# Patient Record
Sex: Male | Born: 1955 | Race: White | Hispanic: No | Marital: Married | Smoking: Never smoker
Health system: Southern US, Community
[De-identification: ages and names within clinical notes are randomized; demographics above are authoritative.]

## PROBLEM LIST (undated history)

## (undated) DIAGNOSIS — N403 Nodular prostate with lower urinary tract symptoms: Secondary | ICD-10-CM

## (undated) DIAGNOSIS — M1990 Unspecified osteoarthritis, unspecified site: Secondary | ICD-10-CM

## (undated) DIAGNOSIS — R7303 Prediabetes: Secondary | ICD-10-CM

## (undated) DIAGNOSIS — M4800 Spinal stenosis, site unspecified: Secondary | ICD-10-CM

## (undated) DIAGNOSIS — J189 Pneumonia, unspecified organism: Secondary | ICD-10-CM

## (undated) DIAGNOSIS — J3081 Allergic rhinitis due to animal (cat) (dog) hair and dander: Secondary | ICD-10-CM

## (undated) DIAGNOSIS — I10 Essential (primary) hypertension: Secondary | ICD-10-CM

## (undated) DIAGNOSIS — Z91018 Allergy to other foods: Secondary | ICD-10-CM

## (undated) DIAGNOSIS — R002 Palpitations: Secondary | ICD-10-CM

## (undated) DIAGNOSIS — G4700 Insomnia, unspecified: Secondary | ICD-10-CM

## (undated) DIAGNOSIS — J302 Other seasonal allergic rhinitis: Secondary | ICD-10-CM

## (undated) DIAGNOSIS — R5383 Other fatigue: Secondary | ICD-10-CM

## (undated) DIAGNOSIS — E7800 Pure hypercholesterolemia, unspecified: Secondary | ICD-10-CM

## (undated) DIAGNOSIS — I491 Atrial premature depolarization: Secondary | ICD-10-CM

## (undated) DIAGNOSIS — M549 Dorsalgia, unspecified: Secondary | ICD-10-CM

## (undated) DIAGNOSIS — I493 Ventricular premature depolarization: Secondary | ICD-10-CM

## (undated) DIAGNOSIS — F411 Generalized anxiety disorder: Secondary | ICD-10-CM

## (undated) DIAGNOSIS — K219 Gastro-esophageal reflux disease without esophagitis: Secondary | ICD-10-CM

## (undated) DIAGNOSIS — I519 Heart disease, unspecified: Secondary | ICD-10-CM

## (undated) DIAGNOSIS — I447 Left bundle-branch block, unspecified: Secondary | ICD-10-CM

## (undated) DIAGNOSIS — H9190 Unspecified hearing loss, unspecified ear: Secondary | ICD-10-CM

## (undated) DIAGNOSIS — E782 Mixed hyperlipidemia: Secondary | ICD-10-CM

## (undated) DIAGNOSIS — I340 Nonrheumatic mitral (valve) insufficiency: Secondary | ICD-10-CM

## (undated) DIAGNOSIS — I2510 Atherosclerotic heart disease of native coronary artery without angina pectoris: Secondary | ICD-10-CM

## (undated) DIAGNOSIS — R0602 Shortness of breath: Secondary | ICD-10-CM

## (undated) DIAGNOSIS — K5730 Diverticulosis of large intestine without perforation or abscess without bleeding: Secondary | ICD-10-CM

## (undated) DIAGNOSIS — Z974 Presence of external hearing-aid: Secondary | ICD-10-CM

## (undated) DIAGNOSIS — I209 Angina pectoris, unspecified: Secondary | ICD-10-CM

## (undated) DIAGNOSIS — F419 Anxiety disorder, unspecified: Secondary | ICD-10-CM

## (undated) DIAGNOSIS — I38 Endocarditis, valve unspecified: Secondary | ICD-10-CM

## (undated) DIAGNOSIS — M2550 Pain in unspecified joint: Secondary | ICD-10-CM

## (undated) HISTORY — DX: Heart disease, unspecified: I51.9

## (undated) HISTORY — DX: Palpitations: R00.2

## (undated) HISTORY — DX: Endocarditis, valve unspecified: I38

## (undated) HISTORY — DX: Anxiety disorder, unspecified: F41.9

## (undated) HISTORY — PX: CORONARY ANGIOPLASTY WITH STENT PLACEMENT: SHX49

## (undated) HISTORY — DX: Spinal stenosis, site unspecified: M48.00

## (undated) HISTORY — DX: Atrial premature depolarization: I49.1

## (undated) HISTORY — DX: Other fatigue: R53.83

## (undated) HISTORY — DX: Essential (primary) hypertension: I10

## (undated) HISTORY — DX: Allergy to other foods: Z91.018

## (undated) HISTORY — DX: Ventricular premature depolarization: I49.3

## (undated) HISTORY — DX: Unspecified hearing loss, unspecified ear: H91.90

## (undated) HISTORY — DX: Dorsalgia, unspecified: M54.9

## (undated) HISTORY — PX: PROSTATE BIOPSY: SHX241

## (undated) HISTORY — DX: Prediabetes: R73.03

## (undated) HISTORY — DX: Shortness of breath: R06.02

## (undated) HISTORY — DX: Pain in unspecified joint: M25.50

## (undated) HISTORY — DX: Unspecified osteoarthritis, unspecified site: M19.90

## (undated) HISTORY — DX: Nonrheumatic mitral (valve) insufficiency: I34.0

## (undated) HISTORY — PX: TONSILLECTOMY: SUR1361

## (undated) HISTORY — PX: CARDIAC CATHETERIZATION: SHX172

---

## 2002-05-10 ENCOUNTER — Encounter: Payer: Self-pay | Admitting: Family Medicine

## 2002-05-10 ENCOUNTER — Encounter: Admission: RE | Admit: 2002-05-10 | Discharge: 2002-05-10 | Payer: Self-pay | Admitting: Family Medicine

## 2008-09-16 DIAGNOSIS — I447 Left bundle-branch block, unspecified: Secondary | ICD-10-CM

## 2008-09-16 HISTORY — DX: Left bundle-branch block, unspecified: I44.7

## 2009-08-12 ENCOUNTER — Emergency Department (HOSPITAL_COMMUNITY): Admission: EM | Admit: 2009-08-12 | Discharge: 2009-08-12 | Payer: Self-pay | Admitting: Emergency Medicine

## 2009-09-16 HISTORY — PX: CARDIOVASCULAR STRESS TEST: SHX262

## 2010-12-19 LAB — DIFFERENTIAL
Basophils Absolute: 0 10*3/uL (ref 0.0–0.1)
Basophils Relative: 0 % (ref 0–1)
Eosinophils Absolute: 0 10*3/uL (ref 0.0–0.7)
Eosinophils Relative: 0 % (ref 0–5)
Lymphocytes Relative: 8 % — ABNORMAL LOW (ref 12–46)
Lymphs Abs: 1 10*3/uL (ref 0.7–4.0)
Monocytes Absolute: 0.5 10*3/uL (ref 0.1–1.0)
Monocytes Relative: 4 % (ref 3–12)
Neutro Abs: 10.9 10*3/uL — ABNORMAL HIGH (ref 1.7–7.7)
Neutrophils Relative %: 87 % — ABNORMAL HIGH (ref 43–77)

## 2010-12-19 LAB — GLUCOSE, CAPILLARY

## 2010-12-19 LAB — CBC
HCT: 44.4 % (ref 39.0–52.0)
Hemoglobin: 15.4 g/dL (ref 13.0–17.0)
MCHC: 34.6 g/dL (ref 30.0–36.0)
MCV: 93.4 fL (ref 78.0–100.0)
Platelets: 231 10*3/uL (ref 150–400)
RBC: 4.76 MIL/uL (ref 4.22–5.81)
RDW: 12.4 % (ref 11.5–15.5)
WBC: 12.5 10*3/uL — ABNORMAL HIGH (ref 4.0–10.5)

## 2010-12-19 LAB — PROTIME-INR
INR: 1 (ref 0.00–1.49)
Prothrombin Time: 13.1 seconds (ref 11.6–15.2)

## 2010-12-19 LAB — POCT I-STAT, CHEM 8
BUN: 11 mg/dL (ref 6–23)
Calcium, Ion: 1.09 mmol/L — ABNORMAL LOW (ref 1.12–1.32)
Chloride: 101 mEq/L (ref 96–112)
Creatinine, Ser: 0.8 mg/dL (ref 0.4–1.5)
Glucose, Bld: 118 mg/dL — ABNORMAL HIGH (ref 70–99)
HCT: 47 % (ref 39.0–52.0)

## 2010-12-19 LAB — POCT CARDIAC MARKERS: Troponin i, poc: <0.05 ng/mL (ref 0.00–0.09)

## 2012-09-25 ENCOUNTER — Ambulatory Visit (INDEPENDENT_AMBULATORY_CARE_PROVIDER_SITE_OTHER): Payer: Managed Care, Other (non HMO) | Admitting: Family Medicine

## 2012-09-25 VITALS — BP 165/85 | HR 74 | Temp 98.0°F | Resp 18 | Ht 66.5 in | Wt 185.0 lb

## 2012-09-25 DIAGNOSIS — I152 Hypertension secondary to endocrine disorders: Secondary | ICD-10-CM | POA: Insufficient documentation

## 2012-09-25 DIAGNOSIS — E1159 Type 2 diabetes mellitus with other circulatory complications: Secondary | ICD-10-CM | POA: Insufficient documentation

## 2012-09-25 DIAGNOSIS — R05 Cough: Secondary | ICD-10-CM

## 2012-09-25 DIAGNOSIS — R509 Fever, unspecified: Secondary | ICD-10-CM

## 2012-09-25 DIAGNOSIS — I10 Essential (primary) hypertension: Secondary | ICD-10-CM

## 2012-09-25 DIAGNOSIS — J3489 Other specified disorders of nose and nasal sinuses: Secondary | ICD-10-CM

## 2012-09-25 LAB — POCT CBC
HCT, POC: 49.4 % (ref 43.5–53.7)
Hemoglobin: 15.6 g/dL (ref 14.1–18.1)
Lymph, poc: 1.9 (ref 0.6–3.4)
MCH, POC: 30.3 pg (ref 27–31.2)
MCHC: 31.6 g/dL — AB (ref 31.8–35.4)
MCV: 95.9 fL (ref 80–97)
WBC: 4.6 10*3/uL (ref 4.6–10.2)

## 2012-09-25 LAB — POCT INFLUENZA A/B
Influenza A, POC: NEGATIVE
Influenza B, POC: NEGATIVE

## 2012-09-25 MED ORDER — CEFDINIR 300 MG PO CAPS: 300.0000 mg | ORAL_CAPSULE | Freq: Two times a day (BID) | ORAL | Status: DC

## 2012-09-25 MED ORDER — OSELTAMIVIR PHOSPHATE 75 MG PO CAPS: 75.0000 mg | ORAL_CAPSULE | Freq: Two times a day (BID) | ORAL | Status: DC

## 2012-09-25 MED ORDER — HYDROCODONE-HOMATROPINE 5-1.5 MG/5ML PO SYRP: 5.0000 mL | ORAL_SOLUTION | Freq: Three times a day (TID) | ORAL | Status: DC | PRN

## 2012-09-25 NOTE — Progress Notes (Signed)
[facility] [address], [place] [address] [phone]  Date:  09/25/2012   Name:  Bobby Munoz   DOB:  [birth year]   MRN:  [id number]  PCP:  No primary provider on file.    Chief Complaint: Fever and Cough   History of Present Illness:  Bobby Munoz is a 57 y.o. very pleasant male patient who presents with the following:  He is here today with illness for the last 5 days.  On Monday he felt "a little not well."  On Tuesday he had a fever to 102.8.  The next day he has just slight temperatures and started to have congestion in his sinuses and chest.  The cough is not very productive.    He did have a ST from drainage, no earache.    He did note body aches and chills.  Feels very tired.  He is actually feeling a bit better today.   He did take his BP medication this am.   He did take some nyquil.   No HA, no CP.    He notes that his BP is always quite elevated at MD office but ok at home  There is no problem list on file for this patient.   Past Medical History  Diagnosis Date  . Allergy   . Arthritis   . Hypertension     History reviewed. No pertinent past surgical history.  History  Substance Use Topics  . Smoking status: Never Smoker   . Smokeless tobacco: Not on file  . Alcohol Use: Not on file    No family history on file.  No Known Allergies  Medication list has been reviewed and updated.  Current Outpatient Prescriptions on File Prior to Visit  Medication Sig Dispense Refill  . losartan (COZAAR) 100 MG tablet Take 100 mg by mouth daily.        Review of Systems:  As per HPI- otherwise negative.   Physical Examination: Filed Vitals:   09/25/12 1644  BP: 184/110  Pulse: 74  Temp: 98 F (36.7 C)  Resp: 18   Filed Vitals:   09/25/12 1644  Height: 5' 6.5" (1.689 m)  Weight: 185 lb (83.915 kg)   Body mass index is 29.41 kg/(m^2). Ideal Body Weight: Weight in (lb) to have BMI = 25: 156.9   GEN: WDWN, NAD,  Non-toxic, A & O x 3 HEENT: Atraumatic, Normocephalic. Neck supple. No masses, No LAD. Bilateral TM wnl, oropharynx normal.  PEERL,EOMI.   Nasal congestion Ears and Nose: No external deformity. CV: RRR, No M/G/R. No JVD. No thrill. No extra heart sounds. PULM: CTA B, no wheezes, crackles, rhonchi. No retractions. No resp. distress. No accessory muscle use. ABD: S, NT, ND, +BS. No rebound. No HSM. EXTR: No c/c/e NEURO Normal gait.  PSYCH: Normally interactive. Conversant. Not depressed or anxious appearing.  Calm demeanor.   Results for orders placed in visit on 09/25/12  POCT INFLUENZA A/B      Component Value Range   Influenza A, POC Negative     Influenza B, POC Negative    POCT CBC      Component Value Range   WBC 4.6  4.6 - 10.2 K/uL   Lymph, poc 1.9  0.6 - 3.4   POC LYMPH PERCENT 41.3  10 - 50 %L   MID (cbc) 0.4  0 - 0.9   POC MID % 9.5  0 - 12 %M   POC Granulocyte 2.3  2 - 6.9   Granulocyte percent 49.2  37 - 80 %G   RBC 5.15  4.69 - 6.13 M/uL   Hemoglobin 15.6  14.1 - 18.1 g/dL   HCT, POC 16.1  09.6 - 53.7 %   MCV 95.9  80 - 97 fL   MCH, POC 30.3  27 - 31.2 pg   MCHC 31.6 (*) 31.8 - 35.4 g/dL   RDW, POC 04.5     Platelet Count, POC 209  142 - 424 K/uL   MPV 8.3  0 - 99.8 fL    Assessment and Plan: 1. Fever  POCT Influenza A/B, POCT CBC, oseltamivir (TAMIFLU) 75 MG capsule  2. HTN (hypertension)  POCT Influenza A/B  3. Cough  POCT Influenza A/B, POCT CBC, cefdinir (OMNICEF) 300 MG capsule, HYDROcodone-homatropine (HYCODAN) 5-1.5 MG/5ML syrup  4. Nasal drainage  POCT Influenza A/B   Typical flu symptoms with negative flu test.  Will treat for flu and pneumonia with omnicef and tamiflu.  Hycodan for use as needed for cough.  Let me know if not better in the next couple of days- Sooner if worse.     Abbe Amsterdam, MD

## 2012-09-25 NOTE — Patient Instructions (Addendum)
We are going to cover you for bronchitis and for the flu.  Hycodan cough syrup as needed- remember it can make you drowsy.  Let me know if not better in the next couple of days- Sooner if worse.

## 2013-09-16 DIAGNOSIS — Z955 Presence of coronary angioplasty implant and graft: Secondary | ICD-10-CM

## 2013-09-16 DIAGNOSIS — I2510 Atherosclerotic heart disease of native coronary artery without angina pectoris: Secondary | ICD-10-CM

## 2013-09-16 HISTORY — DX: Presence of coronary angioplasty implant and graft: Z95.5

## 2013-09-16 HISTORY — DX: Atherosclerotic heart disease of native coronary artery without angina pectoris: I25.10

## 2014-02-01 ENCOUNTER — Emergency Department (HOSPITAL_COMMUNITY): Payer: Managed Care, Other (non HMO)

## 2014-02-01 ENCOUNTER — Other Ambulatory Visit: Payer: Self-pay

## 2014-02-01 ENCOUNTER — Ambulatory Visit (INDEPENDENT_AMBULATORY_CARE_PROVIDER_SITE_OTHER): Payer: Managed Care, Other (non HMO) | Admitting: Emergency Medicine

## 2014-02-01 ENCOUNTER — Encounter (HOSPITAL_COMMUNITY): Payer: Self-pay | Admitting: Emergency Medicine

## 2014-02-01 ENCOUNTER — Emergency Department (HOSPITAL_COMMUNITY)
Admission: EM | Admit: 2014-02-01 | Discharge: 2014-02-01 | Disposition: A | Discharge status: Home / Self Care | Payer: Managed Care, Other (non HMO) | Attending: Emergency Medicine | Admitting: Emergency Medicine

## 2014-02-01 VITALS — BP 134/98 | HR 62 | Temp 97.8°F | Resp 16 | Ht 65.7 in | Wt 182.2 lb

## 2014-02-01 DIAGNOSIS — I200 Unstable angina: Secondary | ICD-10-CM

## 2014-02-01 DIAGNOSIS — I10 Essential (primary) hypertension: Secondary | ICD-10-CM

## 2014-02-01 DIAGNOSIS — Z79899 Other long term (current) drug therapy: Secondary | ICD-10-CM | POA: Insufficient documentation

## 2014-02-01 DIAGNOSIS — M129 Arthropathy, unspecified: Secondary | ICD-10-CM | POA: Insufficient documentation

## 2014-02-01 DIAGNOSIS — R1013 Epigastric pain: Secondary | ICD-10-CM

## 2014-02-01 DIAGNOSIS — R079 Chest pain, unspecified: Secondary | ICD-10-CM

## 2014-02-01 HISTORY — DX: Left bundle-branch block, unspecified: I44.7

## 2014-02-01 LAB — COMPREHENSIVE METABOLIC PANEL
ALBUMIN: 4.2 g/dL (ref 3.5–5.2)
ALK PHOS: 47 U/L (ref 39–117)
ALT: 27 U/L (ref 0–53)
AST: 26 U/L (ref 0–37)
BUN: 16 mg/dL (ref 6–23)
CHLORIDE: 105 meq/L (ref 96–112)
CO2: 21 mEq/L (ref 19–32)
CREATININE: 0.74 mg/dL (ref 0.50–1.35)
Calcium: 9.1 mg/dL (ref 8.4–10.5)
Glucose, Bld: 91 mg/dL (ref 70–99)
Potassium: 4 mEq/L (ref 3.7–5.3)
SODIUM: 141 meq/L (ref 137–147)
Total Bilirubin: 0.7 mg/dL (ref 0.3–1.2)
Total Protein: 7.5 g/dL (ref 6.0–8.3)

## 2014-02-01 LAB — CBC WITH DIFFERENTIAL/PLATELET
BASOS ABS: 0 10*3/uL (ref 0.0–0.1)
BASOS PCT: 0 % (ref 0–1)
Eosinophils Absolute: 0.1 10*3/uL (ref 0.0–0.7)
Eosinophils Relative: 1 % (ref 0–5)
HEMATOCRIT: 45.5 % (ref 39.0–52.0)
Hemoglobin: 15.9 g/dL (ref 13.0–17.0)
Lymphocytes Relative: 21 % (ref 12–46)
Lymphs Abs: 2.1 10*3/uL (ref 0.7–4.0)
MCH: 32 pg (ref 26.0–34.0)
MCHC: 34.9 g/dL (ref 30.0–36.0)
MCV: 91.5 fL (ref 78.0–100.0)
MONO ABS: 0.7 10*3/uL (ref 0.1–1.0)
Monocytes Relative: 6 % (ref 3–12)
NEUTROS ABS: 7.4 10*3/uL (ref 1.7–7.7)
NEUTROS PCT: 72 % (ref 43–77)
PLATELETS: 228 10*3/uL (ref 150–400)
RBC: 4.97 MIL/uL (ref 4.22–5.81)
RDW: 12 % (ref 11.5–15.5)
WBC: 10.3 10*3/uL (ref 4.0–10.5)

## 2014-02-01 LAB — I-STAT TROPONIN, ED
TROPONIN I, POC: 0 ng/mL (ref 0.00–0.08)
Troponin i, poc: 0.01 ng/mL (ref 0.00–0.08)

## 2014-02-01 LAB — LIPASE, BLOOD: LIPASE: 42 U/L (ref 11–59)

## 2014-02-01 MED ORDER — ASPIRIN EC 81 MG PO TBEC: 81.0000 mg | DELAYED_RELEASE_TABLET | Freq: Once | ORAL | Status: DC

## 2014-02-01 MED ORDER — OMEPRAZOLE 20 MG PO CPDR: 20.0000 mg | DELAYED_RELEASE_CAPSULE | Freq: Every day | ORAL | Status: DC

## 2014-02-01 MED ORDER — GI COCKTAIL ~~LOC~~
30.0000 mL | Freq: Once | ORAL | Status: AC
  Administered 2014-02-01: 30 mL via ORAL
  Filled 2014-02-01: qty 30

## 2014-02-01 MED ORDER — ASPIRIN EC 325 MG PO TBEC
325.0000 mg | DELAYED_RELEASE_TABLET | Freq: Once | ORAL | Status: AC
  Administered 2014-02-01: 325 mg via ORAL
  Filled 2014-02-01: qty 1

## 2014-02-01 NOTE — ED Notes (Signed)
Patient transported to X-ray 

## 2014-02-01 NOTE — ED Notes (Signed)
58 [facility] with c/o epigastric discomfort since last week, worsens while lying and with certain foods. Pain 0/10 currently. HX of Reflux and HTN with Family history. Vitals SR w/ PCVC. A/O denies LOC, swelling.  171/117 100 HR 98% RA

## 2014-02-01 NOTE — ED Provider Notes (Signed)
CSN: [id number]     Arrival date & time 02/01/14  1710 History   First MD Initiated Contact with Patient 02/01/14 1716     No chief complaint on file.    (Consider location/radiation/quality/duration/timing/severity/associated sxs/prior Treatment) Patient is a 58 y.o. male presenting with abdominal pain. The history is provided by the patient.  Abdominal Pain Pain location:  Epigastric Pain quality: aching   Pain radiates to:  Does not radiate Pain severity:  Mild Onset quality:  Gradual Duration:  1 week Timing:  Intermittent Progression:  Resolved Chronicity:  New Context: not alcohol use and not trauma   Context comment:  Dry heaving and diarrhea Relieved by:  Nothing Worsened by:  Nothing tried Ineffective treatments:  None tried Associated symptoms: diarrhea (Resolved)   Associated symptoms: no anorexia, no chest pain, no fever, no shortness of breath and no vomiting   Risk factors: no alcohol abuse and no recent hospitalization     Past Medical History  Diagnosis Date  . Allergy   . Arthritis   . Hypertension    No past surgical history on file. No family history on file. History  Substance Use Topics  . Smoking status: Never Smoker   . Smokeless tobacco: Not on file  . Alcohol Use: Not on file    Review of Systems  Constitutional: Negative for fever.  Respiratory: Negative for shortness of breath.   Cardiovascular: Negative for chest pain.  Gastrointestinal: Positive for abdominal pain and diarrhea (Resolved). Negative for vomiting and anorexia.  All other systems reviewed and are negative.     Allergies  Review of patient's allergies indicates no known allergies.  Home Medications   Prior to Admission medications   Medication Sig Start Date End Date Taking? Authorizing Provider  cefdinir (OMNICEF) 300 MG capsule Take 1 capsule (300 mg total) by mouth 2 (two) times daily. 09/25/12   Gwenlyn FoundJessica C Copland, MD  HYDROcodone-homatropine (HYCODAN) 5-1.5  MG/5ML syrup Take 5 mLs by mouth every 8 (eight) hours as needed for cough. 09/25/12   Gwenlyn FoundJessica C Copland, MD  losartan (COZAAR) 100 MG tablet Take 100 mg by mouth daily.    Historical Provider, MD  oseltamivir (TAMIFLU) 75 MG capsule Take 1 capsule (75 mg total) by mouth 2 (two) times daily. 09/25/12   Gwenlyn FoundJessica C Copland, MD   BP 141/87  Pulse 68  Resp 13  SpO2 97% Physical Exam  Constitutional: He is oriented to person, place, and time. He appears well-developed and well-nourished. No distress.  HENT:  Head: Normocephalic and atraumatic.  Eyes: Conjunctivae are normal.  Neck: Neck supple. No tracheal deviation present.  Cardiovascular: Normal rate, regular rhythm and normal heart sounds.   Occasional extrasystoles are present.  No murmur heard. Pulmonary/Chest: Effort normal. No respiratory distress. He has no wheezes. He has no rales.  Abdominal: Soft. He exhibits no distension.  Neurological: He is alert and oriented to person, place, and time.  Skin: Skin is warm and dry.  Psychiatric: He has a normal mood and affect.    ED Course  Procedures (including critical care time) Labs Review Labs Reviewed  CBC WITH DIFFERENTIAL  COMPREHENSIVE METABOLIC PANEL  LIPASE, BLOOD  I-STAT TROPOININ, ED  Rosezena SensorI-STAT TROPOININ, ED    Imaging Review Dg Chest 2 View  02/01/2014   CLINICAL DATA:  Epigastric and chest pain for 2 days, history hypertension  EXAM: CHEST  2 VIEW  COMPARISON:  08/12/2009  FINDINGS: Upper-normal size of cardiac silhouette.  Mediastinal contours and pulmonary vascularity  normal.  Chronic bronchitic changes without infiltrate, pleural effusion or pneumothorax.  Soft tissue calcifications and lateral LEFT upper abdomen.  Broad based dextro convex thoracic scoliosis with scattered endplate spur formation.  IMPRESSION: Chronic bronchitic changes without acute infiltrate.   Electronically Signed   By: Ulyses SouthwardMark  Boles M.D.   On: 02/01/2014 19:22     EKG Interpretation None       Date: 02/02/2014  Rate: 76  Rhythm: normal sinus rhythm  QRS Axis: normal  Intervals: normal  ST/T Wave abnormalities: normal  Conduction Disutrbances:left bundle branch block  Narrative Interpretation:   Old EKG Reviewed: none available   MDM   Final diagnoses:  Epigastric abdominal pain   58 y.o. male presents with epigastric discomfort intermittently over the last week. The illness started with dry heaving, diarrhea, and discomfort. He then felt some discomfort across his whole abdomen is now focused over the very base of his xiphoid. He feels that he is able to alleviate the symptoms somewhat by pushing on his abdomen and getting up to move around. He was sent here from an urgent care for further evaluation after a screening EKG showed left bundle branch block and ventricular bigeminy intermittently. He sees a cardiologist currently for her known mitral valve prolapse which is stable according to him. No other EKGs are available for review in our system. .  Pain is atypical by history and description for unstable angina or ACS. Patient with 2 negative troponins. He states he is always in left bundle branch block and sees a cardiologist and was told them that this is a stable finding. He will be able follow up with them after discharge as he has had 2 negative troponins, his pain appears to be very atypical in nature, and has some improvement with GI cocktail.   Lyndal Pulleyaniel Amily Depp, MD 02/02/14 [phone]

## 2014-02-01 NOTE — Progress Notes (Signed)
[facility] [address], [place] [address] [phone]  Date:  02/01/2014   Name:  Bobby Munoz   DOB:  03-09-1956   MRN:  [id number]  PCP:  No primary provider on file.    Chief Complaint: Chest Pain   History of Present Illness:  Bobby BonitoJames P Munoz is a 58 y.o. very pleasant male patient who presents with the following:  Intermittent epigastric discomfort over past 8 days.  Currently pain free.  Says discomfort lasts an hour or so and is often associated with rapid heart beat.  No radiation or shortness of breath or diaphoresis. Is hesitant to relate pain onset to anything specific but agrees it may be associated with exertion and eating.  No specific food intolerance.  Occurs multiple times a day.  Says when he gets the pain, he often feels a need to walk around.  No history of PUD or GERD.  Non smoker.  History of HBP but no HLD or DM. Father with CAD.  Works as a Comptrollerlibrarian.  No cough or coryza.  Takes ASA daily.  No peripheral edema, orthopnea or DOE.  No improvement with over the counter medications or other home remedies. Denies other complaint or health concern today.   Patient Active Problem List   Diagnosis Date Noted  . HTN (hypertension) 09/25/2012    Past Medical History  Diagnosis Date  . Allergy   . Arthritis   . Hypertension     No past surgical history on file.  History  Substance Use Topics  . Smoking status: Never Smoker   . Smokeless tobacco: Not on file  . Alcohol Use: Not on file    No family history on file.  No Known Allergies  Medication list has been reviewed and updated.  Current Outpatient Prescriptions on File Prior to Visit  Medication Sig Dispense Refill  . losartan (COZAAR) 100 MG tablet Take 100 mg by mouth daily.      . cefdinir (OMNICEF) 300 MG capsule Take 1 capsule (300 mg total) by mouth 2 (two) times daily.  20 capsule  0  . HYDROcodone-homatropine (HYCODAN) 5-1.5 MG/5ML syrup Take 5 mLs by mouth every 8  (eight) hours as needed for cough.  90 mL  0  . oseltamivir (TAMIFLU) 75 MG capsule Take 1 capsule (75 mg total) by mouth 2 (two) times daily.  10 capsule  0   No current facility-administered medications on file prior to visit.    Review of Systems:  As per HPI, otherwise negative.    Physical Examination: Filed Vitals:   02/01/14 1553  BP: 134/98  Pulse: 62  Temp: 97.8 F (36.6 C)  Resp: 16   Filed Vitals:   02/01/14 1553  Height: 5' 5.7" (1.669 m)  Weight: 182 lb 3.2 oz (82.645 kg)   Body mass index is 29.67 kg/(m^2). Ideal Body Weight: Weight in (lb) to have BMI = 25: 153.2  GEN: WDWN, NAD, Non-toxic, A & O x 3 HEENT: Atraumatic, Normocephalic. Neck supple. No masses, No LAD. Ears and Nose: No external deformity. CV: irregular rhythm, No M/G/R. No JVD. No thrill. No extra heart sounds. PULM: CTA B, no wheezes, crackles, rhonchi. No retractions. No resp. distress. No accessory muscle use. ABD: S, NT, ND, +BS. No rebound. No HSM. EXTR: No c/c/e NEURO Normal gait.  PSYCH: Normally interactive. Conversant. Not depressed or anxious appearing.  Calm demeanor.    Assessment and Plan: Unstable angina Ventricular bigeminy To ER  via EMS  Signed,  Phillips OdorJeffery Raianna Slight, MD

## 2014-02-01 NOTE — Discharge Instructions (Signed)
Abdominal Pain, Adult °Many things can cause abdominal pain. Usually, abdominal pain is not caused by a disease and will improve without treatment. It can often be observed and treated at home. Your health care provider will do a physical exam and possibly order blood tests and X-rays to help determine the seriousness of your pain. However, in many cases, more time must pass before a clear cause of the pain can be found. Before that point, your health care provider may not know if you need more testing or further treatment. °HOME CARE INSTRUCTIONS  °Monitor your abdominal pain for any changes. The following actions may help to alleviate any discomfort you are experiencing: °· Only take over-the-counter or prescription medicines as directed by your health care provider. °· Do not take laxatives unless directed to do so by your health care provider. °· Try a clear liquid diet (broth, tea, or water) as directed by your health care provider. Slowly move to a bland diet as tolerated. °SEEK MEDICAL CARE IF: °· You have unexplained abdominal pain. °· You have abdominal pain associated with nausea or diarrhea. °· You have pain when you urinate or have a bowel movement. °· You experience abdominal pain that wakes you in the night. °· You have abdominal pain that is worsened or improved by eating food. °· You have abdominal pain that is worsened with eating fatty foods. °SEEK IMMEDIATE MEDICAL CARE IF:  °· Your pain does not go away within 2 hours. °· You have a fever. °· You keep throwing up (vomiting). °· Your pain is felt only in portions of the abdomen, such as the right side or the left lower portion of the abdomen. °· You pass bloody or black tarry stools. °MAKE SURE YOU: °· Understand these instructions.   °· Will watch your condition.   °· Will get help right away if you are not doing well or get worse.   °Document Released: 06/12/2005 Document Revised: 06/23/2013 Document Reviewed: 05/12/2013 °[employer]® Patient  Information ©2014 [employer]. ° °

## 2014-02-02 NOTE — ED Provider Notes (Signed)
I saw and evaluated the patient, reviewed the resident's note and I agree with the findings and plan.   EKG Interpretation None     Date: 02/02/2014  Rate: 76  Rhythm: normal sinus rhythm  QRS Axis: normal  Intervals: normal  ST/T Wave abnormalities: normal  Conduction Disutrbances:left bundle branch block  Narrative Interpretation:  Old EKG Reviewed: none available   Bobby Munoz is a 58 y.o. male hx of LBBB, HTN here with epigastric pain, chest pain. Intermittent epigastric pain and chest pain for the last week. Worse with food and laying down. Has hx of reflux and LBBB with previous nl stress test. Epigastric tenderness on exam, vitals stable. Lungs and cardiac exam unremarkable. Trop neg x 2. Labs unremarkable. Stable for d/c and has cardiology f/u.     Richardean Canalavid H Yao, MD 02/02/14 1501

## 2014-02-04 ENCOUNTER — Encounter: Payer: Self-pay | Admitting: Emergency Medicine

## 2014-02-10 ENCOUNTER — Other Ambulatory Visit: Payer: Self-pay | Admitting: Gastroenterology

## 2014-02-10 DIAGNOSIS — R1013 Epigastric pain: Secondary | ICD-10-CM

## 2014-02-14 ENCOUNTER — Ambulatory Visit (INDEPENDENT_AMBULATORY_CARE_PROVIDER_SITE_OTHER): Payer: Managed Care, Other (non HMO) | Admitting: Interventional Cardiology

## 2014-02-14 VITALS — BP 150/94 | HR 62 | Ht 66.0 in | Wt 178.0 lb

## 2014-02-14 DIAGNOSIS — E782 Mixed hyperlipidemia: Secondary | ICD-10-CM

## 2014-02-14 DIAGNOSIS — E785 Hyperlipidemia, unspecified: Secondary | ICD-10-CM

## 2014-02-14 DIAGNOSIS — I447 Left bundle-branch block, unspecified: Secondary | ICD-10-CM | POA: Insufficient documentation

## 2014-02-14 DIAGNOSIS — R079 Chest pain, unspecified: Secondary | ICD-10-CM

## 2014-02-14 DIAGNOSIS — E1169 Type 2 diabetes mellitus with other specified complication: Secondary | ICD-10-CM | POA: Insufficient documentation

## 2014-02-14 NOTE — Progress Notes (Signed)
Patient ID: Bobby Munoz, male   DOB: 06/10/1956, 58 y.o.   MRN: [id number]     [address] [place], [place]  [address] Phone: [phone] Fax:  [phone]  Date:  02/14/2014   ID:  Bobby Munoz, DOB 06/10/1956, MRN [id number]  PCP:  Neldon LabellaMILLER,LISA LYNN, MD      History of Present Illness: Bobby Munoz is a 58 y.o. male who has risk factors for heart disease. He has stress test in 2012 showing no ischemia.  He had Been doing well until recently. He Experienced abd and lower sternal discomfort about 3 weeks ago. Had to go to ED for rule out MI. Sent home with Prilosec for "stomach virus". Here today for pre-GI procedure evaluation. Still has some lower sternal discomfort, burning sensation, with adequate relief with Nexium. Lying down makes lower sternal discomfort worse. Feels heart is racing. Has not exerting self in the last 3 weeks. Denies dizziness, syncope, SHOB, orthopnea, PND, and LE edema. No bleeding. BP is well controlled at home. BP usually 130/80. Last stress test was about 2-3 years ago. Tries to eat healthy.    Wt Readings from Last 3 Encounters:  02/14/14 178 lb (80.74 kg)  02/01/14 182 lb 3.2 oz (82.645 kg)  09/25/12 185 lb (83.915 kg)     Past Medical History  Diagnosis Date  . Allergy   . Arthritis   . Hypertension   . LBBB (left bundle branch block) 2010    Current Outpatient Prescriptions  Medication Sig Dispense Refill  . aspirin EC 81 MG tablet Take 81 mg by mouth daily.      Marland Kitchen. CINNAMON PO Take 1 tablet by mouth daily.      . Coenzyme Q10 (COQ10 PO) Take 1 capsule by mouth daily.      . Cyanocobalamin (B-12 PO) Take 1 tablet by mouth daily.      Marland Kitchen. esomeprazole (NEXIUM) 40 MG capsule Take 40 mg by mouth daily at 12 noon.      Marland Kitchen. losartan (COZAAR) 100 MG tablet Take 100 mg by mouth daily.       No current facility-administered medications for this visit.    Allergies:    Allergies  Allergen Reactions  . Latex Itching, Swelling and Rash      Social History:  The patient  reports that he has never smoked. He does not have any smokeless tobacco history on file. He reports that he drinks alcohol. He reports that he does not use illicit drugs.   Family History:  The patient's family history includes Heart attack in his father; Heart failure in his father.   ROS:  Please see the history of present illness.  No nausea, vomiting.  No fevers, chills.  No focal weakness.  No dysuria.    All other systems reviewed and negative.   PHYSICAL EXAM: VS:  BP 150/94  Pulse 62  Ht 5\' 6"  (1.676 m)  Wt 178 lb (80.74 kg)  BMI 28.74 kg/m2 Well nourished, well developed, in no acute distress HEENT: normal Neck: no JVD, no carotid bruits Cardiac:  normal S1, S2; RRR;  Lungs:  clear to auscultation bilaterally, no wheezing, rhonchi or rales Abd: soft, nontender, no hepatomegaly Ext: no edema Skin: warm and dry Neuro:   no focal abnormalities noted  EKG:  NSR, LBBB   ASSESSMENT AND PLAN:  1. Chest pain:  Plan evaluate for ischemia with a stress test. Given his chronic left bundle branch block,  he'll need a pharmacologic nuclear study. His father had heart disease which presented in his 12s. The patient stopped exercising. We'll have him hold off on exercising until the stress test is complete. 2. Hypertension: Blood pressure mildly elevated today. Follow when he is not as nervous. Continue losartan. He had a cough with lisinopril in the past. 3. Hyperlipidemia: LDL was 194 in March 2014. This will need to be addressed.  He may need lipid-lowering therapy.  Check lipids at the time of his stress test.   Signed, Fredric Mare, MD, [facility] 02/14/2014 11:41 AM

## 2014-02-14 NOTE — Patient Instructions (Addendum)
Your physician has requested that you have a lexiscan myoview. For further information please visit [email or web address] Please follow instruction sheet, as given.  Your physician recommends that you return for a FASTING lipid profile on the day of your myoview.

## 2014-02-16 ENCOUNTER — Encounter: Payer: Self-pay | Admitting: Interventional Cardiology

## 2014-02-21 ENCOUNTER — Ambulatory Visit (HOSPITAL_COMMUNITY)
Admission: RE | Admit: 2014-02-21 | Discharge: 2014-02-21 | Disposition: A | Discharge status: Home / Self Care | Payer: Managed Care, Other (non HMO) | Source: Ambulatory Visit | Attending: Gastroenterology | Admitting: Gastroenterology

## 2014-02-21 DIAGNOSIS — R1013 Epigastric pain: Secondary | ICD-10-CM | POA: Insufficient documentation

## 2014-02-21 MED ORDER — SINCALIDE 5 MCG IJ SOLR
INTRAMUSCULAR | Status: AC
  Administered 2014-02-21: 5 ug
  Filled 2014-02-21: qty 5

## 2014-02-21 MED ORDER — TECHNETIUM TC 99M MEBROFENIN IV KIT
5.0000 | PACK | Freq: Once | INTRAVENOUS | Status: AC | PRN
  Administered 2014-02-21: 5 via INTRAVENOUS

## 2014-02-24 ENCOUNTER — Other Ambulatory Visit (INDEPENDENT_AMBULATORY_CARE_PROVIDER_SITE_OTHER): Payer: Managed Care, Other (non HMO)

## 2014-02-24 ENCOUNTER — Ambulatory Visit (HOSPITAL_COMMUNITY): Payer: Managed Care, Other (non HMO) | Attending: Internal Medicine | Admitting: Radiology

## 2014-02-24 VITALS — BP 159/92 | Ht 66.0 in | Wt 182.0 lb

## 2014-02-24 DIAGNOSIS — Z8249 Family history of ischemic heart disease and other diseases of the circulatory system: Secondary | ICD-10-CM | POA: Insufficient documentation

## 2014-02-24 DIAGNOSIS — I447 Left bundle-branch block, unspecified: Secondary | ICD-10-CM | POA: Insufficient documentation

## 2014-02-24 DIAGNOSIS — R079 Chest pain, unspecified: Secondary | ICD-10-CM

## 2014-02-24 DIAGNOSIS — E785 Hyperlipidemia, unspecified: Secondary | ICD-10-CM

## 2014-02-24 DIAGNOSIS — I10 Essential (primary) hypertension: Secondary | ICD-10-CM | POA: Insufficient documentation

## 2014-02-24 LAB — LIPID PANEL
CHOLESTEROL: 200 mg/dL (ref 0–200)
HDL: 40.7 mg/dL (ref >39.00)
LDL Cholesterol: 143 mg/dL — ABNORMAL HIGH (ref 0–99)
NonHDL: 159.3
TRIGLYCERIDES: 84 mg/dL (ref 0.0–149.0)
Total CHOL/HDL Ratio: 5
VLDL: 16.8 mg/dL (ref 0.0–40.0)

## 2014-02-24 MED ORDER — TECHNETIUM TC 99M SESTAMIBI GENERIC - CARDIOLITE
11.0000 | Freq: Once | INTRAVENOUS | Status: AC | PRN
  Administered 2014-02-24: 11 via INTRAVENOUS

## 2014-02-24 MED ORDER — TECHNETIUM TC 99M SESTAMIBI GENERIC - CARDIOLITE
33.0000 | Freq: Once | INTRAVENOUS | Status: AC | PRN
  Administered 2014-02-24: 33 via INTRAVENOUS

## 2014-02-24 MED ORDER — ADENOSINE (DIAGNOSTIC) 3 MG/ML IV SOLN
0.8400 mg/kg | Freq: Once | INTRAVENOUS | Status: AC
  Administered 2014-02-24: 69.3 mg via INTRAVENOUS
  Administered 2014-02-25: 46.3 mg via INTRAVENOUS

## 2014-02-24 NOTE — Progress Notes (Signed)
[facility] SITE 3 NUCLEAR MED [address] [place], [place] [address] [phone]    Cardiology Nuclear Med Study  Bobby Munoz is a 58 y.o. male     MRN : [id number]     DOB: 09-02-1956  Procedure Date: 02/24/2014  Nuclear Med Background Indication for Stress Test:  Evaluation for Ischemia and Post Hospital 5/15 CP History:  2012 MPI Normal EF 49% Cardiac Risk Factors: Family History - CAD, Hypertension, LBBB and Lipids  Symptoms:  Chest Pain   Nuclear Pre-Procedure Caffeine/Decaff Intake:  None NPO After: 7:00pm   Lungs:  clear O2 Sat: 98% on room air. IV 0.9% NS with Angio Cath:  22g  IV Site: R Hand  IV Started by:  Bonnita Levan, RN  Chest Size (in):  44 Cup Size: n/a  Height: 5\' 6"  (1.676 m)  Weight:  182 lb (82.555 kg)  BMI:  Body mass index is 29.39 kg/(m^2). Tech Comments:  N/A    Nuclear Med Study 1 or 2 day study: 1 day  Stress Test Type:  Adenosine  Reading MD: N/A  Order Authorizing Provider:  Varney Daily, MD  Resting Radionuclide: Technetium 32m Sestamibi  Resting Radionuclide Dose: 11.0 mCi   Stress Radionuclide:  Technetium 22m Sestamibi  Stress Radionuclide Dose: 33.0 mCi           Stress Protocol Rest HR: 63 Stress HR: 75  Rest BP: 159/92 Stress BP: 163/91  Exercise Time (min): n/a METS: n/a   Predicted Max HR: 162 bpm % Max HR: 46.3 bpm Rate Pressure Product: 50539   Dose of Adenosine (mg):  46.3 Dose of Lexiscan: n/a mg  Dose of Atropine (mg): n/a Dose of Dobutamine: n/a mcg/kg/min (at max HR)  Stress Test Technologist: Bonnita Levan, RN  Nuclear Technologist:  Domenic Polite, CNMT     Rest Procedure:  Myocardial perfusion imaging was performed at rest 45 minutes following the intravenous administration of Technetium 17m Sestamibi. Rest ECG: No acute changes and Sinus bradycardia with nonspecific IVCD and reciprocal ST changes  Stress Procedure:  The patient received IV adenosine at 140 mcg/kg/min for 4-minutes. Technetium  49m Sestamibi was injected at the 2-minute mark and quantitative spect images were obtained. Stress ECG: No significant change from baseline ECG except for occasiona PVC's  QPS Raw Data Images:  Mild diaphragmatic attenuation.  Normal left ventricular size. Stress Images:  There is decreased uptake in the inferior wall. Rest Images:  There is decreased uptake in the inferior wall. Subtraction (SDS):  Mainly fixed inferior defect will a small area of reversibility which may represent peri infarct ischemia Transient Ischemic Dilatation (Normal <1.22):  1.15 Lung/Heart Ratio (Normal <0.45):  0.30  Quantitative Gated Spect Images QGS EDV:  127 ml QGS ESV:  77 ml  Impression Exercise Capacity:  Adenosine study with no exercise. BP Response:  Hypotensive blood pressure response. Clinical Symptoms:  No symptoms. ECG Impression:  No significant ST segment change from baseline EKG of IVCD with reciprocal ST changes with adenosine. Comparison with Prior Nuclear Study: No images to compare  Overall Impression:  Intermediate risk stress nuclear study Primarily fixed inferior defect with a small area of reversibility that may represent infarct with peri infarct ischemia but also could be due to variation in diaphragmatic attenuation..  LV Ejection Fraction: 39%.  LV Wall Motion:  Mild to moderate LV dysfunction with diffuse hypokinesis  Signed: Armanda Magic, MD [facility]

## 2014-02-28 ENCOUNTER — Telehealth: Payer: Self-pay | Admitting: Cardiology

## 2014-02-28 DIAGNOSIS — E782 Mixed hyperlipidemia: Secondary | ICD-10-CM

## 2014-02-28 MED ORDER — FISH OIL 1000 MG PO CPDR: 2000.0000 mg | DELAYED_RELEASE_CAPSULE | Freq: Two times a day (BID) | ORAL | Status: DC

## 2014-02-28 NOTE — Telephone Encounter (Signed)
Pt notified. Meds updated and labs ordered.  

## 2014-02-28 NOTE — Telephone Encounter (Signed)
Message copied by Theda SersSTEGALL, Charlott Calvario H on Mon Feb 28, 2014  4:55 PM ------      Message from: Corky CraftsVARANASI, JAYADEEP S      Created: Thu Feb 24, 2014  2:58 PM       LDL still a little high.  Would like to see LDL < 130.  COntinue to exercise and watch diet .  COuld start Fish oil 2 grams BID and recheck lipids in 4 months. ------

## 2014-02-28 NOTE — Addendum Note (Signed)
Addended byOrlene Plum: Naaman Curro H on: 02/28/2014 05:01 PM   Modules accepted: Orders

## 2014-03-02 ENCOUNTER — Encounter: Payer: Self-pay | Admitting: Cardiology

## 2014-03-02 ENCOUNTER — Other Ambulatory Visit: Payer: Self-pay | Admitting: Cardiology

## 2014-03-02 DIAGNOSIS — R9439 Abnormal result of other cardiovascular function study: Secondary | ICD-10-CM

## 2014-03-04 ENCOUNTER — Other Ambulatory Visit (INDEPENDENT_AMBULATORY_CARE_PROVIDER_SITE_OTHER): Payer: Managed Care, Other (non HMO)

## 2014-03-04 DIAGNOSIS — R9439 Abnormal result of other cardiovascular function study: Secondary | ICD-10-CM

## 2014-03-04 LAB — CBC WITH DIFFERENTIAL/PLATELET
Basophils Absolute: 0 10*3/uL (ref 0.0–0.1)
Basophils Relative: 0.4 % (ref 0.0–3.0)
EOS PCT: 0.5 % (ref 0.0–5.0)
Eosinophils Absolute: 0.1 10*3/uL (ref 0.0–0.7)
HEMATOCRIT: 44.7 % (ref 39.0–52.0)
Hemoglobin: 15 g/dL (ref 13.0–17.0)
LYMPHS ABS: 2.6 10*3/uL (ref 0.7–4.0)
Lymphocytes Relative: 25.7 % (ref 12.0–46.0)
MCHC: 33.6 g/dL (ref 30.0–36.0)
MCV: 92.4 fl (ref 78.0–100.0)
MONO ABS: 0.6 10*3/uL (ref 0.1–1.0)
Monocytes Relative: 5.9 % (ref 3.0–12.0)
Neutro Abs: 6.8 10*3/uL (ref 1.4–7.7)
Neutrophils Relative %: 67.5 % (ref 43.0–77.0)
PLATELETS: 230 10*3/uL (ref 150.0–400.0)
RBC: 4.84 Mil/uL (ref 4.22–5.81)
RDW: 12.7 % (ref 11.5–15.5)
WBC: 10.1 10*3/uL (ref 4.0–10.5)

## 2014-03-04 LAB — PROTIME-INR
INR: 1 ratio (ref 0.8–1.0)
Prothrombin Time: 11.3 s (ref 9.6–13.1)

## 2014-03-04 LAB — BASIC METABOLIC PANEL
BUN: 14 mg/dL (ref 6–23)
CO2: 29 mEq/L (ref 19–32)
Calcium: 9.2 mg/dL (ref 8.4–10.5)
Chloride: 108 mEq/L (ref 96–112)
Creatinine, Ser: 0.9 mg/dL (ref 0.4–1.5)
GFR: 95.62 mL/min (ref >60.00)
GLUCOSE: 98 mg/dL (ref 70–99)
POTASSIUM: 3.7 meq/L (ref 3.5–5.1)
Sodium: 142 mEq/L (ref 135–145)

## 2014-03-07 ENCOUNTER — Other Ambulatory Visit: Payer: Self-pay | Admitting: Interventional Cardiology

## 2014-03-07 DIAGNOSIS — R9430 Abnormal result of cardiovascular function study, unspecified: Secondary | ICD-10-CM

## 2014-03-08 ENCOUNTER — Telehealth: Payer: Self-pay | Admitting: Interventional Cardiology

## 2014-03-08 ENCOUNTER — Other Ambulatory Visit: Payer: Self-pay | Admitting: Interventional Cardiology

## 2014-03-08 ENCOUNTER — Ambulatory Visit (HOSPITAL_COMMUNITY)
Admission: RE | Admit: 2014-03-08 | Discharge: 2014-03-08 | Disposition: A | Discharge status: Home / Self Care | Payer: Managed Care, Other (non HMO) | Source: Ambulatory Visit | Attending: Interventional Cardiology | Admitting: Interventional Cardiology

## 2014-03-08 ENCOUNTER — Encounter (HOSPITAL_COMMUNITY)
Admission: RE | Disposition: A | Discharge status: Home / Self Care | Payer: Self-pay | Source: Ambulatory Visit | Attending: Interventional Cardiology

## 2014-03-08 DIAGNOSIS — Z7982 Long term (current) use of aspirin: Secondary | ICD-10-CM | POA: Insufficient documentation

## 2014-03-08 DIAGNOSIS — R9430 Abnormal result of cardiovascular function study, unspecified: Secondary | ICD-10-CM

## 2014-03-08 DIAGNOSIS — E785 Hyperlipidemia, unspecified: Secondary | ICD-10-CM | POA: Insufficient documentation

## 2014-03-08 DIAGNOSIS — Z79899 Other long term (current) drug therapy: Secondary | ICD-10-CM

## 2014-03-08 DIAGNOSIS — I2510 Atherosclerotic heart disease of native coronary artery without angina pectoris: Secondary | ICD-10-CM | POA: Insufficient documentation

## 2014-03-08 DIAGNOSIS — I447 Left bundle-branch block, unspecified: Secondary | ICD-10-CM | POA: Insufficient documentation

## 2014-03-08 DIAGNOSIS — I10 Essential (primary) hypertension: Secondary | ICD-10-CM | POA: Insufficient documentation

## 2014-03-08 DIAGNOSIS — I739 Peripheral vascular disease, unspecified: Secondary | ICD-10-CM

## 2014-03-08 HISTORY — PX: LEFT HEART CATHETERIZATION WITH CORONARY ANGIOGRAM: SHX5451

## 2014-03-08 SURGERY — LEFT HEART CATHETERIZATION WITH CORONARY ANGIOGRAM
Anesthesia: LOCAL

## 2014-03-08 MED ORDER — DIAZEPAM 5 MG PO TABS
5.0000 mg | ORAL_TABLET | Freq: Once | ORAL | Status: AC
  Administered 2014-03-08: 5 mg via ORAL

## 2014-03-08 MED ORDER — ATORVASTATIN CALCIUM 10 MG PO TABS: 10.0000 mg | ORAL_TABLET | Freq: Every day | ORAL | Status: DC

## 2014-03-08 MED ORDER — SODIUM CHLORIDE 0.9 % IV SOLN
INTRAVENOUS | Status: DC
  Administered 2014-03-08: 08:00:00 via INTRAVENOUS

## 2014-03-08 MED ORDER — ASPIRIN 81 MG PO CHEW
CHEWABLE_TABLET | ORAL | Status: AC
  Administered 2014-03-08: 81 mg via ORAL
  Filled 2014-03-08: qty 1

## 2014-03-08 MED ORDER — SODIUM CHLORIDE 0.9 % IV SOLN: 1.0000 mL/kg/h | INTRAVENOUS | Status: DC

## 2014-03-08 MED ORDER — ISOSORBIDE MONONITRATE ER 30 MG PO TB24: 30.0000 mg | ORAL_TABLET | Freq: Every day | ORAL | Status: DC

## 2014-03-08 MED ORDER — NITROGLYCERIN 0.2 MG/ML ON CALL CATH LAB
INTRAVENOUS | Status: AC
  Filled 2014-03-08: qty 1

## 2014-03-08 MED ORDER — HEPARIN (PORCINE) IN NACL 2-0.9 UNIT/ML-% IJ SOLN
INTRAMUSCULAR | Status: AC
  Filled 2014-03-08: qty 1000

## 2014-03-08 MED ORDER — MIDAZOLAM HCL 2 MG/2ML IJ SOLN
INTRAMUSCULAR | Status: AC
  Filled 2014-03-08: qty 2

## 2014-03-08 MED ORDER — ASPIRIN 81 MG PO CHEW: 81.0000 mg | CHEWABLE_TABLET | Freq: Every day | ORAL | Status: DC

## 2014-03-08 MED ORDER — SODIUM CHLORIDE 0.9 % IJ SOLN: 3.0000 mL | INTRAMUSCULAR | Status: DC | PRN

## 2014-03-08 MED ORDER — FENTANYL CITRATE 0.05 MG/ML IJ SOLN
INTRAMUSCULAR | Status: AC
  Filled 2014-03-08: qty 2

## 2014-03-08 MED ORDER — VERAPAMIL HCL 2.5 MG/ML IV SOLN
INTRAVENOUS | Status: AC
  Filled 2014-03-08: qty 2

## 2014-03-08 MED ORDER — SODIUM CHLORIDE 0.9 % IV SOLN: 250.0000 mL | INTRAVENOUS | Status: DC | PRN

## 2014-03-08 MED ORDER — DIAZEPAM 5 MG PO TABS
ORAL_TABLET | ORAL | Status: AC
  Filled 2014-03-08: qty 1

## 2014-03-08 MED ORDER — LIDOCAINE HCL (PF) 1 % IJ SOLN
INTRAMUSCULAR | Status: AC
  Filled 2014-03-08: qty 30

## 2014-03-08 MED ORDER — ASPIRIN 81 MG PO CHEW
81.0000 mg | CHEWABLE_TABLET | ORAL | Status: AC
  Administered 2014-03-08: 81 mg via ORAL

## 2014-03-08 MED ORDER — SODIUM CHLORIDE 0.9 % IJ SOLN: 3.0000 mL | Freq: Two times a day (BID) | INTRAMUSCULAR | Status: DC

## 2014-03-08 MED ORDER — HEPARIN SODIUM (PORCINE) 1000 UNIT/ML IJ SOLN
INTRAMUSCULAR | Status: AC
  Filled 2014-03-08: qty 1

## 2014-03-08 NOTE — Addendum Note (Signed)
Addended byOrlene Plum: STEGALL, AMY H on: 03/08/2014 01:50 PM   Modules accepted: Orders

## 2014-03-08 NOTE — H&P (View-Only) (Signed)
Patient ID: Bobby Munoz, male   DOB: 06/10/1956, 58 y.o.   MRN: [id number]     [address] [place], [place]  [address] Phone: [phone] Fax:  [phone]  Date:  02/14/2014   ID:  Bobby Munoz, DOB 06/10/1956, MRN [id number]  PCP:  Neldon LabellaMILLER,LISA LYNN, MD      History of Present Illness: Bobby Munoz is a 58 y.o. male who has risk factors for heart disease. He has stress test in 2012 showing no ischemia.  He had Been doing well until recently. He Experienced abd and lower sternal discomfort about 3 weeks ago. Had to go to ED for rule out MI. Sent home with Prilosec for "stomach virus". Here today for pre-GI procedure evaluation. Still has some lower sternal discomfort, burning sensation, with adequate relief with Nexium. Lying down makes lower sternal discomfort worse. Feels heart is racing. Has not exerting self in the last 3 weeks. Denies dizziness, syncope, SHOB, orthopnea, PND, and LE edema. No bleeding. BP is well controlled at home. BP usually 130/80. Last stress test was about 2-3 years ago. Tries to eat healthy.    Wt Readings from Last 3 Encounters:  02/14/14 178 lb (80.74 kg)  02/01/14 182 lb 3.2 oz (82.645 kg)  09/25/12 185 lb (83.915 kg)     Past Medical History  Diagnosis Date  . Allergy   . Arthritis   . Hypertension   . LBBB (left bundle branch block) 2010    Current Outpatient Prescriptions  Medication Sig Dispense Refill  . aspirin EC 81 MG tablet Take 81 mg by mouth daily.      Marland Kitchen. CINNAMON PO Take 1 tablet by mouth daily.      . Coenzyme Q10 (COQ10 PO) Take 1 capsule by mouth daily.      . Cyanocobalamin (B-12 PO) Take 1 tablet by mouth daily.      Marland Kitchen. esomeprazole (NEXIUM) 40 MG capsule Take 40 mg by mouth daily at 12 noon.      Marland Kitchen. losartan (COZAAR) 100 MG tablet Take 100 mg by mouth daily.       No current facility-administered medications for this visit.    Allergies:    Allergies  Allergen Reactions  . Latex Itching, Swelling and Rash      Social History:  The patient  reports that he has never smoked. He does not have any smokeless tobacco history on file. He reports that he drinks alcohol. He reports that he does not use illicit drugs.   Family History:  The patient's family history includes Heart attack in his father; Heart failure in his father.   ROS:  Please see the history of present illness.  No nausea, vomiting.  No fevers, chills.  No focal weakness.  No dysuria.    All other systems reviewed and negative.   PHYSICAL EXAM: VS:  BP 150/94  Pulse 62  Ht 5\' 6"  (1.676 m)  Wt 178 lb (80.74 kg)  BMI 28.74 kg/m2 Well nourished, well developed, in no acute distress HEENT: normal Neck: no JVD, no carotid bruits Cardiac:  normal S1, S2; RRR;  Lungs:  clear to auscultation bilaterally, no wheezing, rhonchi or rales Abd: soft, nontender, no hepatomegaly Ext: no edema Skin: warm and dry Neuro:   no focal abnormalities noted  EKG:  NSR, LBBB   ASSESSMENT AND PLAN:  1. Chest pain:  Plan evaluate for ischemia with a stress test. Given his chronic left bundle branch block,  he'll need a pharmacologic nuclear study. His father had heart disease which presented in his 50s. The patient stopped exercising. We'll have him hold off on exercising until the stress test is complete. 2. Hypertension: Blood pressure mildly elevated today. Follow when he is not as nervous. Continue losartan. He had a cough with lisinopril in the past. 3. Hyperlipidemia: LDL was 194 in March 2014. This will need to be addressed.  He may need lipid-lowering therapy.  Check lipids at the time of his stress test.   Signed, Jay S. Baby Stairs, MD, FACC 02/14/2014 11:41 AM   

## 2014-03-08 NOTE — Interval H&P Note (Signed)
Cath Lab Visit (complete for each Cath Lab visit)  Clinical Evaluation Leading to the Procedure:   ACS: no  Non-ACS:    Anginal Classification: CCS III  Anti-ischemic medical therapy: No Therapy  Non-Invasive Test Results: Intermediate-risk stress test findings: cardiac mortality 1-3%/year  Prior CABG: No previous CABG      History and Physical Interval Note:  03/08/2014 9:02 AM  Bobby Munoz  has presented today for surgery, with the diagnosis of abnormal stress test  The various methods of treatment have been discussed with the patient and family. After consideration of risks, benefits and other options for treatment, the patient has consented to  Procedure(s): LEFT HEART CATHETERIZATION WITH CORONARY ANGIOGRAM (N/A) as a surgical intervention .  The patient's history has been reviewed, patient examined, no change in status, stable for surgery.  I have reviewed the patient's chart and labs.  Questions were answered to the patient's satisfaction.     [name] S.

## 2014-03-08 NOTE — Discharge Instructions (Signed)
Radial Site Care °Refer to this sheet in the next few weeks. These instructions provide you with information on caring for yourself after your procedure. Your caregiver may also give you more specific instructions. Your treatment has been planned according to current medical practices, but problems sometimes occur. Call your caregiver if you have any problems or questions after your procedure. °HOME CARE INSTRUCTIONS °· You may shower the day after the procedure. Remove the bandage (dressing) and gently wash the site with plain soap and water. Gently pat the site dry. °· Do not apply powder or lotion to the site. °· Do not submerge the affected site in water for 3 to 5 days. °· Inspect the site at least twice daily. °· Do not flex or bend the affected arm for 24 hours. °· No lifting over 5 pounds (2.3 kg) for 5 days after your procedure. °· Do not drive home if you are discharged the same day of the procedure. Have someone else drive you. °· You may drive 24 hours after the procedure unless otherwise instructed by your caregiver. °· Do not operate machinery or power tools for 24 hours. °· A responsible adult should be with you for the first 24 hours after you arrive home. °What to expect: °· Any bruising will usually fade within 1 to 2 weeks. °· Blood that collects in the tissue (hematoma) may be painful to the touch. It should usually decrease in size and tenderness within 1 to 2 weeks. °SEEK IMMEDIATE MEDICAL CARE IF: °· You have unusual pain at the radial site. °· You have redness, warmth, swelling, or pain at the radial site. °· You have drainage (other than a small amount of blood on the dressing). °· You have chills. °· You have a fever or persistent symptoms for more than 72 hours. °· You have a fever and your symptoms suddenly get worse. °· Your arm becomes pale, cool, tingly, or numb. °· You have heavy bleeding from the site. Hold pressure on the site. °Document Released: 10/05/2010 Document Revised:  11/25/2011 Document Reviewed: 10/05/2010 °ExitCare® Patient Information ©2015 [employer]. This information is not intended to replace advice given to you by your health care provider. Make sure you discuss any questions you have with your health care provider. ° °

## 2014-03-08 NOTE — Telephone Encounter (Signed)
Start atorvastatin 10 mg daily. Thanks.

## 2014-03-08 NOTE — CV Procedure (Addendum)
PROCEDURE:  Left heart catheterization with selective coronary angiography, left ventriculogram.   Right upper extremity angiogram.  INDICATIONS:  Abnormal stress test  The risks, benefits, and details of the procedure were explained to the patient.  The patient verbalized understanding and wanted to proceed.  Informed written consent was obtained.  PROCEDURE TECHNIQUE:  After Xylocaine anesthesia a 21F slender sheath was placed in the right radial artery with a single anterior needle wall stick.   Multiple angiograms of the right upper extremity was performed, both from the sheath as well from a JR 4 catheter above the elbow. Due to severe spasm, the patient required multiple doses of intra-coronary nitroglycerin. Right coronary angiography was done using a 5 French Judkins R4 guide catheter.  Left coronary angiography was done using a 4 French Judkins L3.5 guide catheter.  Left ventriculography was done using a 4 French pigtail catheter.  Several 5 French catheters did not pass through the arm. A TR band was used for hemostasis.   CONTRAST:  Total of 95 cc.  COMPLICATIONS:  None.    HEMODYNAMICS:  Aortic pressure was 101/68; LV pressure was 101/2; LVEDP 14.  There was no gradient between the left ventricle and aorta.    ANGIOGRAPHIC DATA:   The left main coronary artery is widely patent.  The left anterior descending artery is a large vessel proximally. The vessel reaches the apex but does not wrap around. There is disease in the mid LAD. It is diffuse and up to 80%.  There is mild disease further down in the mid LAD.  The apical LAD is small but patent.  The left circumflex artery is a very large, codominant vessel. There is minimal atherosclerosis in the circumflex system. There is a very large first obtuse marginal. This is widely patent. The second obtuse marginal is also large and patent. There are several medium size obtuse marginal vessels which are patent. There is a small left  PDA which is patent..  The right coronary artery is a large vessel proximally. There is moderate disease in the mid vessel. It had a large RV marginal branch, the RCA is occluded. There are ipsilateral collaterals from this RV marginal which filled the distal RCA system. There may left right collaterals feeding the distal RCA system as well.  Right upper extremity and plan: When hand-injection was performed through the sheath, there is evidence of spasm in the radial artery below the elbow. When performed through the JR 4 catheter above the elbow, there is evidence of diffuse spasm in brachial artery.  LEFT VENTRICULOGRAM:  Left ventricular angiogram was done in the 30 RAO projection and revealed mild inferior hypokinesis with overall low normal systolic function with an estimated ejection fraction of 50%.  LVEDP was 14 mmHg.  IMPRESSIONS:  1. Normal left main coronary artery. 2. Severe, diffuse disease in the mid left anterior descending artery. 3. Widely patent codominant left circumflex artery and its branches. 4. Occluded mid right coronary artery.  Ipsilateral collaterals fill the distal vessel. 5.   Low normal left ventricular systolic function.  LVEDP 14 mmHg.  Ejection fraction 50%. 6.   Would not use radial approach in the future due to severe vasospasm.  RECOMMENDATION:  Significant 2 vessel coronary artery disease. We'll discuss options with the patient regarding PCI versus CABG. Will intensify medical therapy as well. Start isosorbide.  Start statin.  Have patient decrease physical activity until decision about revascularization is made. F/u in the office.

## 2014-03-08 NOTE — Addendum Note (Signed)
Addended byOrlene Plum: STEGALL, AMY H on: 03/08/2014 01:49 PM   Modules accepted: Orders

## 2014-03-08 NOTE — Progress Notes (Signed)
I spoke to the patient  And his wife at length regarding options for revascularization.  We discussed CABG vs. Multivessel PCI vs. PCI LAD with medical mgmt of the RCA.  I offered a referral to a cardiac surgeon for discussion of CABG, but he declined.  He would like to follow a strategy of PCI.  He understands the risks and benefits of PCI.  He would be willing to undergo CABG if there was a complication with the PCI.  He is willing to take longterm DAPT.  At this time, he would like to avoid CABG.  He understands that in the future, if his disease progresses, he could still need CABG at that time.    The patient has ben set up for PCI tomorrow.  His IV will be left in overnight because he is a difficult stick.

## 2014-03-08 NOTE — Telephone Encounter (Signed)
Pt notified. Rx sent in and meds updated. Pt already has future lipid appt.

## 2014-03-09 ENCOUNTER — Encounter (HOSPITAL_COMMUNITY): Payer: Self-pay | Admitting: Interventional Cardiology

## 2014-03-09 ENCOUNTER — Ambulatory Visit (HOSPITAL_COMMUNITY)
Admission: RE | Admit: 2014-03-09 | Discharge: 2014-03-10 | Disposition: A | Discharge status: Home / Self Care | Payer: Managed Care, Other (non HMO) | Source: Ambulatory Visit | Attending: Interventional Cardiology | Admitting: Interventional Cardiology

## 2014-03-09 ENCOUNTER — Encounter (HOSPITAL_COMMUNITY)
Admission: RE | Disposition: A | Discharge status: Home / Self Care | Payer: Managed Care, Other (non HMO) | Source: Ambulatory Visit | Attending: Interventional Cardiology

## 2014-03-09 DIAGNOSIS — I209 Angina pectoris, unspecified: Secondary | ICD-10-CM | POA: Diagnosis present

## 2014-03-09 DIAGNOSIS — I10 Essential (primary) hypertension: Secondary | ICD-10-CM | POA: Insufficient documentation

## 2014-03-09 DIAGNOSIS — I498 Other specified cardiac arrhythmias: Secondary | ICD-10-CM | POA: Insufficient documentation

## 2014-03-09 DIAGNOSIS — Z8249 Family history of ischemic heart disease and other diseases of the circulatory system: Secondary | ICD-10-CM | POA: Insufficient documentation

## 2014-03-09 DIAGNOSIS — I499 Cardiac arrhythmia, unspecified: Secondary | ICD-10-CM

## 2014-03-09 DIAGNOSIS — E785 Hyperlipidemia, unspecified: Secondary | ICD-10-CM | POA: Insufficient documentation

## 2014-03-09 DIAGNOSIS — Z7982 Long term (current) use of aspirin: Secondary | ICD-10-CM | POA: Insufficient documentation

## 2014-03-09 DIAGNOSIS — I2510 Atherosclerotic heart disease of native coronary artery without angina pectoris: Secondary | ICD-10-CM | POA: Insufficient documentation

## 2014-03-09 DIAGNOSIS — R9430 Abnormal result of cardiovascular function study, unspecified: Secondary | ICD-10-CM | POA: Diagnosis present

## 2014-03-09 DIAGNOSIS — Z538 Procedure and treatment not carried out for other reasons: Secondary | ICD-10-CM | POA: Insufficient documentation

## 2014-03-09 DIAGNOSIS — Z955 Presence of coronary angioplasty implant and graft: Secondary | ICD-10-CM

## 2014-03-09 DIAGNOSIS — I447 Left bundle-branch block, unspecified: Secondary | ICD-10-CM | POA: Insufficient documentation

## 2014-03-09 HISTORY — DX: Gastro-esophageal reflux disease without esophagitis: K21.9

## 2014-03-09 HISTORY — DX: Atherosclerotic heart disease of native coronary artery without angina pectoris: I25.10

## 2014-03-09 HISTORY — DX: Pneumonia, unspecified organism: J18.9

## 2014-03-09 HISTORY — DX: Allergic rhinitis due to animal (cat) (dog) hair and dander: J30.81

## 2014-03-09 HISTORY — DX: Other seasonal allergic rhinitis: J30.2

## 2014-03-09 HISTORY — DX: Pure hypercholesterolemia, unspecified: E78.00

## 2014-03-09 HISTORY — DX: Angina pectoris, unspecified: I20.9

## 2014-03-09 HISTORY — PX: PERCUTANEOUS CORONARY STENT INTERVENTION (PCI-S): SHX5485

## 2014-03-09 LAB — BASIC METABOLIC PANEL
BUN: 17 mg/dL (ref 6–23)
CHLORIDE: 105 meq/L (ref 96–112)
CO2: 23 meq/L (ref 19–32)
Calcium: 8.9 mg/dL (ref 8.4–10.5)
Creatinine, Ser: 0.86 mg/dL (ref 0.50–1.35)
GFR calc Af Amer: >90 mL/min (ref >90)
GFR calc non Af Amer: >90 mL/min (ref >90)
GLUCOSE: 104 mg/dL — AB (ref 70–99)
POTASSIUM: 4 meq/L (ref 3.7–5.3)
Sodium: 142 mEq/L (ref 137–147)

## 2014-03-09 LAB — CBC
HCT: 39.4 % (ref 39.0–52.0)
HEMOGLOBIN: 13.2 g/dL (ref 13.0–17.0)
MCH: 30.8 pg (ref 26.0–34.0)
MCHC: 33.5 g/dL (ref 30.0–36.0)
MCV: 91.8 fL (ref 78.0–100.0)
PLATELETS: 187 10*3/uL (ref 150–400)
RBC: 4.29 MIL/uL (ref 4.22–5.81)
RDW: 12.6 % (ref 11.5–15.5)
WBC: 9.6 10*3/uL (ref 4.0–10.5)

## 2014-03-09 LAB — POCT ACTIVATED CLOTTING TIME: Activated Clotting Time: 422 seconds

## 2014-03-09 SURGERY — PERCUTANEOUS CORONARY STENT INTERVENTION (PCI-S)
Anesthesia: LOCAL

## 2014-03-09 MED ORDER — FENTANYL CITRATE 0.05 MG/ML IJ SOLN
INTRAMUSCULAR | Status: AC
  Filled 2014-03-09: qty 2

## 2014-03-09 MED ORDER — ASPIRIN 81 MG PO CHEW: 81.0000 mg | CHEWABLE_TABLET | Freq: Every day | ORAL | Status: DC

## 2014-03-09 MED ORDER — ISOSORBIDE MONONITRATE ER 30 MG PO TB24: 30.0000 mg | ORAL_TABLET | Freq: Every day | ORAL | Status: DC

## 2014-03-09 MED ORDER — ASPIRIN EC 81 MG PO TBEC
81.0000 mg | DELAYED_RELEASE_TABLET | Freq: Every day | ORAL | Status: DC
  Administered 2014-03-10: 81 mg via ORAL
  Filled 2014-03-09: qty 1

## 2014-03-09 MED ORDER — LOSARTAN POTASSIUM 50 MG PO TABS
100.0000 mg | ORAL_TABLET | Freq: Every day | ORAL | Status: DC
  Administered 2014-03-10: 100 mg via ORAL
  Filled 2014-03-09: qty 2

## 2014-03-09 MED ORDER — ATORVASTATIN CALCIUM 10 MG PO TABS: 10.0000 mg | ORAL_TABLET | Freq: Every day | ORAL | Status: DC

## 2014-03-09 MED ORDER — CLOPIDOGREL BISULFATE 75 MG PO TABS
600.0000 mg | ORAL_TABLET | ORAL | Status: AC
  Administered 2014-03-09: 600 mg via ORAL
  Filled 2014-03-09 (×2): qty 8

## 2014-03-09 MED ORDER — DIAZEPAM 5 MG PO TABS
5.0000 mg | ORAL_TABLET | Freq: Once | ORAL | Status: AC
  Administered 2014-03-09: 5 mg via ORAL
  Filled 2014-03-09: qty 1

## 2014-03-09 MED ORDER — NITROGLYCERIN 0.2 MG/ML ON CALL CATH LAB
INTRAVENOUS | Status: AC
Start: 2014-03-09 — End: 2014-03-09
  Filled 2014-03-09: qty 1

## 2014-03-09 MED ORDER — PANTOPRAZOLE SODIUM 40 MG PO TBEC
40.0000 mg | DELAYED_RELEASE_TABLET | Freq: Every day | ORAL | Status: DC
  Administered 2014-03-10: 40 mg via ORAL
  Filled 2014-03-09: qty 1

## 2014-03-09 MED ORDER — SODIUM CHLORIDE 0.9 % IV SOLN
0.2500 mg/kg/h | INTRAVENOUS | Status: AC
  Administered 2014-03-09: 0.25 mg/kg/h via INTRAVENOUS
  Filled 2014-03-09: qty 250

## 2014-03-09 MED ORDER — ASPIRIN 81 MG PO CHEW
81.0000 mg | CHEWABLE_TABLET | ORAL | Status: AC
  Administered 2014-03-09: 81 mg via ORAL
  Filled 2014-03-09: qty 1

## 2014-03-09 MED ORDER — SODIUM CHLORIDE 0.9 % IJ SOLN: 3.0000 mL | Freq: Two times a day (BID) | INTRAMUSCULAR | Status: DC

## 2014-03-09 MED ORDER — SODIUM CHLORIDE 0.9 % IJ SOLN: 3.0000 mL | INTRAMUSCULAR | Status: DC | PRN

## 2014-03-09 MED ORDER — MIDAZOLAM HCL 2 MG/2ML IJ SOLN
INTRAMUSCULAR | Status: AC
  Filled 2014-03-09: qty 2

## 2014-03-09 MED ORDER — LIDOCAINE HCL (PF) 1 % IJ SOLN
INTRAMUSCULAR | Status: AC
  Filled 2014-03-09: qty 30

## 2014-03-09 MED ORDER — ASPIRIN 81 MG PO CHEW: 81.0000 mg | CHEWABLE_TABLET | ORAL | Status: DC

## 2014-03-09 MED ORDER — SODIUM CHLORIDE 0.9 % IV SOLN: 250.0000 mL | INTRAVENOUS | Status: DC | PRN

## 2014-03-09 MED ORDER — SODIUM CHLORIDE 0.9 % IV SOLN
0.2500 mg/kg/h | INTRAVENOUS | Status: AC
  Administered 2014-03-09: 0.25 mg/kg/h via INTRAVENOUS

## 2014-03-09 MED ORDER — HEPARIN (PORCINE) IN NACL 2-0.9 UNIT/ML-% IJ SOLN
INTRAMUSCULAR | Status: AC
  Filled 2014-03-09: qty 1000

## 2014-03-09 MED ORDER — BIVALIRUDIN 250 MG IV SOLR
INTRAVENOUS | Status: AC
  Filled 2014-03-09: qty 250

## 2014-03-09 MED ORDER — ONDANSETRON HCL 4 MG/2ML IJ SOLN: 4.0000 mg | Freq: Four times a day (QID) | INTRAMUSCULAR | Status: DC | PRN

## 2014-03-09 MED ORDER — LOSARTAN POTASSIUM 50 MG PO TABS: 100.0000 mg | ORAL_TABLET | Freq: Every day | ORAL | Status: DC

## 2014-03-09 MED ORDER — ACETAMINOPHEN 325 MG PO TABS
650.0000 mg | ORAL_TABLET | ORAL | Status: DC | PRN
  Administered 2014-03-09: 650 mg via ORAL
  Filled 2014-03-09: qty 2

## 2014-03-09 MED ORDER — CLOPIDOGREL BISULFATE 75 MG PO TABS
75.0000 mg | ORAL_TABLET | Freq: Every day | ORAL | Status: DC
  Administered 2014-03-10: 75 mg via ORAL
  Filled 2014-03-09: qty 1

## 2014-03-09 MED ORDER — SODIUM CHLORIDE 0.9 % IV SOLN
INTRAVENOUS | Status: DC
  Administered 2014-03-09: 07:00:00 via INTRAVENOUS

## 2014-03-09 MED ORDER — SODIUM CHLORIDE 0.9 % IV SOLN: 1.0000 mL/kg/h | INTRAVENOUS | Status: AC

## 2014-03-09 NOTE — Interval H&P Note (Signed)
Cath Lab Visit (complete for each Cath Lab visit)  Clinical Evaluation Leading to the Procedure:   ACS: no  Non-ACS:    Anginal Classification: CCS III  Anti-ischemic medical therapy: Minimal Therapy (1 class of medications)  Non-Invasive Test Results: Intermediate-risk stress test findings: cardiac mortality 1-3%/year  Prior CABG: No previous CABG  2 vessel CAD.  He opted for PCI.  Please see my previous note.     History and Physical Interval Note:  03/09/2014 8:59 AM  Bobby BonitoJames P Ludington  has presented today for surgery, with the diagnosis of blockage  The various methods of treatment have been discussed with the patient and family. After consideration of risks, benefits and other options for treatment, the patient has consented to  Procedure(s): PERCUTANEOUS CORONARY STENT INTERVENTION (PCI-S) (N/A) as a surgical intervention .  The patient's history has been reviewed, patient examined, no change in status, stable for surgery.  I have reviewed the patient's chart and labs.  Questions were answered to the patient's satisfaction.     Cayla Wiegand S.

## 2014-03-09 NOTE — CV Procedure (Signed)
    PROCEDURE:  PCI LAD; attempted PCI RCA  INDICATIONS:  Abnormal stress test, angina  The risks, benefits, and details of the procedure were explained to the patient.  The patient verbalized understanding and wanted to proceed.  Informed written consent was obtained.  PROCEDURE TECHNIQUE:  After Xylocaine anesthesia a 72F sheath was placed in the right femoral artery with a single anterior needle wall stick.   Intervention was performed. Please see below for details. Hemostasis was obtained with an Angio-Seal.   CONTRAST:  Total of 150 cc.  COMPLICATIONS:  None.        ANGIOGRAPHIC DATA:     The left anterior descending artery is heavily diseased in the mid portion.   The right coronary artery is occluded in the midportion.  PCI NARRATIVE:  Angiomax was used for anticoagulation. The patient had been pretreated with 600 mg of clopidogrel. A CLS 3.5 guiding catheters using his left main. A pro-water wire was placed across the area disease in the LAD. A 2.5 x 30 balloon was used to predilate the entire segment of disease with multiple balloon inflations. A 2.5 x 38 Xience drug-eluting stent was deployed in the distal segment of disease. In overlapping fashion, a 3.0 x 38 stent was deployed. The entire stented segment was post dilated with a 3.5 x 20 noncompliant balloon. In the more proximal vessel, the vessel was post dilated to high pressure. All branches remained patent with TIMI 3 flow. There was an excellent angiographic result.  IMPRESSIONS:  1.  Successful PCI of the mid left anterior descending artery with overlapping drug-eluting stents of 2.5 x 38 and 3.0 x 38, Xience, postdilated to greater than 3.5 mm proximally.  2. Attempted angioplasty of the right coronary artery.  Unable to cross the entire occlusion. We were able, with a Fielder XT wire, to cross into the lesion across the proximal cap.   RECOMMENDATION:  Continue medical therapy. If he has refractory angina, would  bring him back for another PCI attempt of the RCA. At that point, would likely use a balloon supported stiffer wire to try and cross the distal cap.  Continue dual antiplatelet therapy for at least a year without interruption.

## 2014-03-09 NOTE — H&P (View-Only) (Signed)
Patient ID: Bobby BonitoJames P Munoz, male   DOB: 06/10/1956, 58 y.o.   MRN: [id number]     [address] [place], [place]  [address] Phone: [phone] Fax:  [phone]  Date:  02/14/2014   ID:  Bobby MasseJames P Munoz, DOB 06/10/1956, MRN [id number]  PCP:  Bobby LabellaMILLER,Bobby LYNN, MD      History of Present Illness: Bobby BonitoJames P Munoz is a 58 y.o. male who has risk factors for heart disease. He has stress test in 2012 showing no ischemia.  He had Been doing well until recently. He Experienced abd and lower sternal discomfort about 3 weeks ago. Had to go to ED for rule out MI. Sent home with Prilosec for "stomach virus". Here today for pre-GI procedure evaluation. Still has some lower sternal discomfort, burning sensation, with adequate relief with Nexium. Lying down makes lower sternal discomfort worse. Feels heart is racing. Has not exerting self in the last 3 weeks. Denies dizziness, syncope, SHOB, orthopnea, PND, and LE edema. No bleeding. BP is well controlled at home. BP usually 130/80. Last stress test was about 2-3 years ago. Tries to eat healthy.    Wt Readings from Last 3 Encounters:  02/14/14 178 lb (80.74 kg)  02/01/14 182 lb 3.2 oz (82.645 kg)  09/25/12 185 lb (83.915 kg)     Past Medical History  Diagnosis Date  . Allergy   . Arthritis   . Hypertension   . LBBB (left bundle branch block) 2010    Current Outpatient Prescriptions  Medication Sig Dispense Refill  . aspirin EC 81 MG tablet Take 81 mg by mouth daily.      Marland Kitchen. CINNAMON PO Take 1 tablet by mouth daily.      . Coenzyme Q10 (COQ10 PO) Take 1 capsule by mouth daily.      . Cyanocobalamin (B-12 PO) Take 1 tablet by mouth daily.      Marland Kitchen. esomeprazole (NEXIUM) 40 MG capsule Take 40 mg by mouth daily at 12 noon.      Marland Kitchen. losartan (COZAAR) 100 MG tablet Take 100 mg by mouth daily.       No current facility-administered medications for this visit.    Allergies:    Allergies  Allergen Reactions  . Latex Itching, Swelling and Rash      Social History:  The patient  reports that he has never smoked. He does not have any smokeless tobacco history on file. He reports that he drinks alcohol. He reports that he does not use illicit drugs.   Family History:  The patient's family history includes Heart attack in his father; Heart failure in his father.   ROS:  Please see the history of present illness.  No nausea, vomiting.  No fevers, chills.  No focal weakness.  No dysuria.    All other systems reviewed and negative.   PHYSICAL EXAM: VS:  BP 150/94  Pulse 62  Ht 5\' 6"  (1.676 m)  Wt 178 lb (80.74 kg)  BMI 28.74 kg/m2 Well nourished, well developed, in no acute distress HEENT: normal Neck: no JVD, no carotid bruits Cardiac:  normal S1, S2; RRR;  Lungs:  clear to auscultation bilaterally, no wheezing, rhonchi or rales Abd: soft, nontender, no hepatomegaly Ext: no edema Skin: warm and dry Neuro:   no focal abnormalities noted  EKG:  NSR, LBBB   ASSESSMENT AND PLAN:  1. Chest pain:  Plan evaluate for ischemia with a stress test. Given his chronic left bundle branch block,  he'll need a pharmacologic nuclear study. His father had heart disease which presented in his 50s. The patient stopped exercising. We'll have him hold off on exercising until the stress test is complete. 2. Hypertension: Blood pressure mildly elevated today. Follow when he is not as nervous. Continue losartan. He had a cough with lisinopril in the past. 3. Hyperlipidemia: LDL was 194 in March 2014. This will need to be addressed.  He may need lipid-lowering therapy.  Check lipids at the time of his stress test.   Signed, Jay S. Zeva Leber, MD, FACC 02/14/2014 11:41 AM   

## 2014-03-10 DIAGNOSIS — I209 Angina pectoris, unspecified: Secondary | ICD-10-CM

## 2014-03-10 DIAGNOSIS — R9430 Abnormal result of cardiovascular function study, unspecified: Secondary | ICD-10-CM

## 2014-03-10 DIAGNOSIS — I498 Other specified cardiac arrhythmias: Secondary | ICD-10-CM

## 2014-03-10 DIAGNOSIS — I499 Cardiac arrhythmia, unspecified: Secondary | ICD-10-CM

## 2014-03-10 DIAGNOSIS — I447 Left bundle-branch block, unspecified: Secondary | ICD-10-CM

## 2014-03-10 LAB — CBC
HCT: 39.8 % (ref 39.0–52.0)
HEMOGLOBIN: 13 g/dL (ref 13.0–17.0)
MCH: 30 pg (ref 26.0–34.0)
MCHC: 32.7 g/dL (ref 30.0–36.0)
MCV: 91.7 fL (ref 78.0–100.0)
Platelets: 166 10*3/uL (ref 150–400)
RBC: 4.34 MIL/uL (ref 4.22–5.81)
RDW: 12.3 % (ref 11.5–15.5)
WBC: 10.2 10*3/uL (ref 4.0–10.5)

## 2014-03-10 LAB — BASIC METABOLIC PANEL
BUN: 12 mg/dL (ref 6–23)
CALCIUM: 8.7 mg/dL (ref 8.4–10.5)
CO2: 24 mEq/L (ref 19–32)
Chloride: 106 mEq/L (ref 96–112)
Creatinine, Ser: 0.74 mg/dL (ref 0.50–1.35)
GFR calc Af Amer: >90 mL/min (ref >90)
GFR calc non Af Amer: >90 mL/min (ref >90)
GLUCOSE: 97 mg/dL (ref 70–99)
POTASSIUM: 3.8 meq/L (ref 3.7–5.3)
SODIUM: 141 meq/L (ref 137–147)

## 2014-03-10 MED ORDER — CLOPIDOGREL BISULFATE 75 MG PO TABS: 75.0000 mg | ORAL_TABLET | Freq: Every day | ORAL | Status: DC

## 2014-03-10 MED ORDER — CARVEDILOL 3.125 MG PO TABS: 3.1250 mg | ORAL_TABLET | Freq: Two times a day (BID) | ORAL | Status: DC

## 2014-03-10 MED ORDER — PANTOPRAZOLE SODIUM 40 MG PO TBEC: 40.0000 mg | DELAYED_RELEASE_TABLET | Freq: Every day | ORAL | Status: DC

## 2014-03-10 MED ORDER — ISOSORBIDE MONONITRATE 15 MG HALF TABLET
15.0000 mg | ORAL_TABLET | Freq: Every day | ORAL | Status: DC
  Administered 2014-03-10: 15 mg via ORAL
  Filled 2014-03-10: qty 1

## 2014-03-10 MED ORDER — LOSARTAN POTASSIUM 50 MG PO TABS: 50.0000 mg | ORAL_TABLET | Freq: Every day | ORAL | Status: DC

## 2014-03-10 MED ORDER — ATORVASTATIN CALCIUM 40 MG PO TABS: 40.0000 mg | ORAL_TABLET | Freq: Every day | ORAL | Status: DC

## 2014-03-10 MED ORDER — ATORVASTATIN CALCIUM 40 MG PO TABS
40.0000 mg | ORAL_TABLET | Freq: Every day | ORAL | Status: DC
  Administered 2014-03-10: 40 mg via ORAL
  Filled 2014-03-10: qty 1

## 2014-03-10 MED FILL — Sodium Chloride IV Soln 0.9%: INTRAVENOUS | Qty: 50 | Status: AC

## 2014-03-10 NOTE — Discharge Summary (Signed)
The patient has ambulated with no complaints. He does have ventricular bigeminy that is asymptomatic. Have decreased the Losartin and started low dose beta blocker to help with V ectopy. This will need to be titrated as an OP. Plan discharge today and f/u with Dr. Eldridge DaceVaranasi.

## 2014-03-10 NOTE — Progress Notes (Signed)
    Subjective: No complaints  Objective: Vital signs in last 24 hours: Temp:  [97.9 F (36.6 C)-99.1 F (37.3 C)] 98.2 F (36.8 C) (06/25 0500) Pulse Rate:  [43-76] 59 (06/25 0500) Resp:  [15-18] 18 (06/25 0500) BP: (92-144)/(39-90) 144/90 mmHg (06/25 0500) SpO2:  [95 %-98 %] 97 % (06/25 0500) Weight:  [182 lb 6.4 oz (82.736 kg)] 182 lb 6.4 oz (82.736 kg) (06/25 0500) Last BM Date: 03/09/14  Intake/Output from previous day: 06/24 0701 - 06/25 0700 In: 597.1 [I.V.:597.1] Out: -  Intake/Output this shift:    Medications Current Facility-Administered Medications  Medication Dose Route Frequency Antaniya Venuti Last Rate Last Dose  . acetaminophen (TYLENOL) tablet 650 mg  650 mg Oral Q4H PRN Corky CraftsJayadeep S Varanasi, MD   650 mg at 03/09/14 2158  . aspirin EC tablet 81 mg  81 mg Oral Daily Corky CraftsJayadeep S Varanasi, MD      . clopidogrel (PLAVIX) tablet 75 mg  75 mg Oral Q breakfast Corky CraftsJayadeep S Varanasi, MD      . losartan (COZAAR) tablet 100 mg  100 mg Oral Daily Corky CraftsJayadeep S Varanasi, MD      . ondansetron [facility](ZOFRAN) injection 4 mg  4 mg Intravenous Q6H PRN Corky CraftsJayadeep S Varanasi, MD      . pantoprazole (PROTONIX) EC tablet 40 mg  40 mg Oral Daily Corky CraftsJayadeep S Varanasi, MD        PE: General appearance: alert, cooperative and no distress Lungs: clear to auscultation bilaterally Heart: regularly irregular rhythm Extremities: No LEE Pulses: 2+ and symmetric Skin: Right groin and right wrist:  No ecchymosis or hematoma. Neurologic: Grossly normal  Lab Results:   Recent Labs  03/09/14 0648 03/10/14 0318  WBC 9.6 10.2  HGB 13.2 13.0  HCT 39.4 39.8  PLT 187 166   BMET  Recent Labs  03/09/14 0648 03/10/14 0318  NA 142 141  K 4.0 3.8  CL 105 106  CO2 23 24  GLUCOSE 104* 97  BUN 17 12  CREATININE 0.86 0.74  CALCIUM 8.9 8.7     Assessment/Plan   Active Problems:   Nonspecific abnormal unspecified cardiovascular function study   Other and unspecified angina pectoris   CAD    HTN   HLD  Plan:  SP left heart cath revealing Normal left main coronary artery.  Severe, diffuse disease in the mid left anterior descending artery.  Widely patent codominant left circumflex artery and its branches. Occluded mid right coronary artery. Ipsilateral collaterals fill the distal vessel. Low normal left ventricular systolic function. LVEDP 14 mmHg. Ejection fraction 50%.    He underwent successful PCI of the mid LADwith overlapping drug-eluting stents of 2.5 x 38 and 3.0 x 38, Xience, postdilated to greater than 3.5 mm proximally.  Attempted angioplasty of the right coronary artery. Unable to cross the entire occlusion. We were able, with a Fielder XT wire, to cross into the lesion across the proximal cap.  Imdur and statin added.   Decrease physical activity until follow up in the office.  ASA, Plavix.  Continue protonix.  Cardiac rehab to see this morning.   Not on a beta blocker but is on cozaar.  BP may not support both with imdur.  Consider switching or decreasing cozaar and adding coreg.     LOS: 1 day    HAGER, BRYAN PA-C 03/10/2014 6:57 AM

## 2014-03-10 NOTE — Progress Notes (Signed)
Reviewed discharge instructions with patient and wife, they stated their understanding.  Reviewed new medications also.  Discharged home via wheelchair by volunteers.  Colman Caterarpley, Jenan Ellegood Danielle

## 2014-03-10 NOTE — Progress Notes (Signed)
CARDIAC REHAB PHASE I   PRE:  Rate/Rhythm: 78 SR LBBB    BP: sitting 141/66    SaO2:   MODE:  Ambulation: 500 ft   POST:  Rate/Rhythm: 112-120 bigeminy    BP: sitting 154/64     SaO2:   Pt in SR with LBBB while eating. Did ed (30 min) then apparently when pt got out of bed he went into bigeminy, rate 105. No c/o and pt walked 500 ft. Rate up to 120 at times, consistently bigeminy. After about 5 min of rest pt did c/o same low sternal "discomfort" with palpitations when asked. Sts "I can feel it so it makes me nervous and uncomfortable". Pt sts this is the same sensation he has been having for weeks, no change in severity. Gave pt guidelines for light walking until he sees [place] again. [place] [facility] [place] Will send referral. [phone]   Harriet MassonReeve, Randi Kristan CES, ACSM 03/10/2014 9:17 AM

## 2014-03-10 NOTE — Discharge Summary (Signed)
Physician Discharge Summary     Cardiologist:  Eldridge DaceVaranasi  Patient ID: Bobby Munoz MRN: [id number] DOB/AGE: 1956/02/09 58 y.o.  Admit date: 03/09/2014 Discharge date: 03/10/2014  Admission Diagnoses:  Positive nuclear stress test  Discharge Diagnoses:  Active Problems:   Nonspecific abnormal unspecified cardiovascular function study   Other and unspecified angina pectoris   CAD    HTN    HLD   Discharged Condition: stable  Hospital Course:   Bobby BonitoJames P Munoz is a 58 y.o. male who has risk factors for heart disease. He has stress test in 2012 showing no ischemia.  He's been doing well until recently. He Experienced abd and lower sternal discomfort about 3 weeks ago. Had to go to ED for rule out MI. Sent home with Prilosec for "stomach virus". Here today for pre-GI procedure evaluation. Still has some lower sternal discomfort, burning sensation, with adequate relief with Nexium. Lying down makes lower sternal discomfort worse. Feels heart is racing. Has not exerting self in the last 3 weeks. Denies dizziness, syncope, SHOB, orthopnea, PND, and LE edema. No bleeding. BP is well controlled at home. BP usually 130/80. Tries to eat healthy.   He underwent nuclear stress testing which revealed a primarily fixed inferior defect with a small area of reversibility that may represent infarct with peri infarct ischemia.    He was admitted for coronary angiography which revealed significant 2 vessel coronary artery disease. Medical therapy was increased with Imdur and statin.  He went back the following day and had successful PCI of the mid left anterior descending artery with overlapping drug-eluting stents of 2.5 x 38 and 3.0 x 38, Xience, postdilated to greater than 3.5 mm proximally.  Attempted angioplasty of the right coronary artery. Unable to cross the entire occlusion. We were able, with a Fielder XT wire, to cross into the lesion across the proximal cap.  If he has refractory angina, will bring him  back for another PCI attempt of the RCA.  He was started on ASA and plavix.  He was told to stop omeprazole due to interaction with plavix and started on protonix.  He was having persistent bigeminy.  Cozaar was decreased to 50 and coreg 3.125bid added. The patient was seen by Dr. Katrinka BlazingSmith who felt he was stable for DC home. He will follow up at cardiac rehab.   Consults: Cardiac rehab  Significant Diagnostic Studies:    03/08/14,  PROCEDURE: Left heart catheterization with selective coronary angiography, left ventriculogram. Right upper extremity angiogram.   INDICATIONS: Abnormal stress test  The risks, benefits, and details of the procedure were explained to the patient. The patient verbalized understanding and wanted to proceed. Informed written consent was obtained.  PROCEDURE TECHNIQUE: After Xylocaine anesthesia a 79F slender sheath was placed in the right radial artery with a single anterior needle wall stick. Multiple angiograms of the right upper extremity was performed, both from the sheath as well from a JR 4 catheter above the elbow. Due to severe spasm, the patient required multiple doses of intra-coronary nitroglycerin. Right coronary angiography was done using a 5 French Judkins R4 guide catheter. Left coronary angiography was done using a 4 French Judkins L3.5 guide catheter. Left ventriculography was done using a 4 French pigtail catheter. Several 5 French catheters did not pass through the arm. A TR band was used for hemostasis.  CONTRAST: Total of 95 cc.  COMPLICATIONS: None.  HEMODYNAMICS: Aortic pressure was 101/68; LV pressure was 101/2; LVEDP 14. There was no gradient  between the left ventricle and aorta.  ANGIOGRAPHIC DATA: The left main coronary artery is widely patent.  The left anterior descending artery is a large vessel proximally. The vessel reaches the apex but does not wrap around. There is disease in the mid LAD. It is diffuse and up to 80%. There is mild disease further  down in the mid LAD. The apical LAD is small but patent.  The left circumflex artery is a very large, codominant vessel. There is minimal atherosclerosis in the circumflex system. There is a very large first obtuse marginal. This is widely patent. The second obtuse marginal is also large and patent. There are several medium size obtuse marginal vessels which are patent. There is a small left PDA which is patent..  The right coronary artery is a large vessel proximally. There is moderate disease in the mid vessel. It had a large RV marginal branch, the RCA is occluded. There are ipsilateral collaterals from this RV marginal which filled the distal RCA system. There may left right collaterals feeding the distal RCA system as well.  Right upper extremity and plan: When hand-injection was performed through the sheath, there is evidence of spasm in the radial artery below the elbow. When performed through the JR 4 catheter above the elbow, there is evidence of diffuse spasm in brachial artery.  LEFT VENTRICULOGRAM: Left ventricular angiogram was done in the 30 RAO projection and revealed mild inferior hypokinesis with overall low normal systolic function with an estimated ejection fraction of 50%. LVEDP was 14 mmHg.  IMPRESSIONS:  1. Normal left main coronary artery. 2. Severe, diffuse disease in the mid left anterior descending artery. 3. Widely patent codominant left circumflex artery and its branches. 4. Occluded mid right coronary artery. Ipsilateral collaterals fill the distal vessel. 5. Low normal left ventricular systolic function. LVEDP 14 mmHg. Ejection fraction 50%.  6. Would not use radial approach in the future due to severe vasospasm.  RECOMMENDATION: Significant 2 vessel coronary artery disease. We'll discuss options with the patient regarding PCI versus CABG. Will intensify medical therapy as well. Start isosorbide. Start statin. Have patient decrease physical activity until decision about  revascularization is made. F/u in the office.   03/09/14,  PROCEDURE: PCI LAD; attempted PCI RCA   INDICATIONS: Abnormal stress test, angina  The risks, benefits, and details of the procedure were explained to the patient. The patient verbalized understanding and wanted to proceed. Informed written consent was obtained.  PROCEDURE TECHNIQUE: After Xylocaine anesthesia a 64F sheath was placed in the right femoral artery with a single anterior needle wall stick. Intervention was performed. Please see below for details. Hemostasis was obtained with an Angio-Seal.  CONTRAST: Total of 150 cc.  COMPLICATIONS: None.  ANGIOGRAPHIC DATA:  The left anterior descending artery is heavily diseased in the mid portion.  The right coronary artery is occluded in the midportion.  PCI NARRATIVE: Angiomax was used for anticoagulation. The patient had been pretreated with 600 mg of clopidogrel. A CLS 3.5 guiding catheters using his left main. A pro-water wire was placed across the area disease in the LAD. A 2.5 x 30 balloon was used to predilate the entire segment of disease with multiple balloon inflations. A 2.5 x 38 Xience drug-eluting stent was deployed in the distal segment of disease. In overlapping fashion, a 3.0 x 38 stent was deployed. The entire stented segment was post dilated with a 3.5 x 20 noncompliant balloon. In the more proximal vessel, the vessel was post dilated to  high pressure. All branches remained patent with TIMI 3 flow. There was an excellent angiographic result.  IMPRESSIONS:  5. Successful PCI of the mid left anterior descending artery with overlapping drug-eluting stents of 2.5 x 38 and 3.0 x 38, Xience, postdilated to greater than 3.5 mm proximally. 2. Attempted angioplasty of the right coronary artery. Unable to cross the entire occlusion. We were able, with a Fielder XT wire, to cross into the lesion across the proximal cap. RECOMMENDATION: Continue medical therapy. If he has refractory  angina, would bring him back for another PCI attempt of the RCA. At that point, would likely use a balloon supported stiffer wire to try and cross the distal cap.  Continue dual antiplatelet therapy for at least a year without interruption.   Lipid Panel     Component Value Date/Time   CHOL 200 02/24/2014 0747   TRIG 84.0 02/24/2014 0747   HDL 40.70 02/24/2014 0747   CHOLHDL 5 02/24/2014 0747   VLDL 16.8 02/24/2014 0747   LDLCALC 143* 02/24/2014 0747     Treatments: See above  Discharge Exam: Blood pressure 144/90, pulse 59, temperature 98.2 F (36.8 C), temperature source Oral, resp. rate 18, height 5\' 7"  (1.702 m), weight 182 lb 6.4 oz (82.736 kg), SpO2 97.00%.   Disposition: 01-Home or Self Care      Discharge Instructions   Amb Referral to Cardiac Rehabilitation    Complete by:  As directed      Diet - low sodium heart healthy    Complete by:  As directed      Discharge instructions    Complete by:  As directed   No lifting more than a half gallon of milk or driving for three days. Follow decreased activity schedule as directed by Cardiac Rehab.     Increase activity slowly    Complete by:  As directed             Medication List    STOP taking these medications       esomeprazole 40 MG capsule  Commonly known as:  NEXIUM  Replaced by:  pantoprazole 40 MG tablet      TAKE these medications       aspirin EC 81 MG tablet  Take 81 mg by mouth daily.     atorvastatin 40 MG tablet  Commonly known as:  LIPITOR  Take 1 tablet (40 mg total) by mouth daily at 6 PM.     B-12 PO  Take 1 tablet by mouth daily.     carvedilol 3.125 MG tablet  Commonly known as:  COREG  Take 1 tablet (3.125 mg total) by mouth 2 (two) times daily with a meal.     CINNAMON PO  Take 1 tablet by mouth daily.     clopidogrel 75 MG tablet  Commonly known as:  PLAVIX  Take 1 tablet (75 mg total) by mouth daily with breakfast.     COQ10 PO  Take 1 capsule by mouth daily.     Fish  Oil 1000 MG Cpdr  Take 2,000 mg by mouth daily.     isosorbide mononitrate 30 MG 24 hr tablet  Commonly known as:  IMDUR  Take 30 mg by mouth daily.     losartan 50 MG tablet  Commonly known as:  COZAAR  Take 1 tablet (50 mg total) by mouth daily.     pantoprazole 40 MG tablet  Commonly known as:  PROTONIX  Take 1 tablet (40 mg total)  by mouth daily.       Follow-up Information   Follow up with Jacolyn Reedy, PA-C On 03/21/2014. (8:45 AM)    Specialty:  Cardiology   Contact information:   [address] [place] [address] [phone]      Greater than 30 minutes was spent completing the patient's discharge.    SignedWilburt Finlay, PAC 03/10/2014, 11:27 AM

## 2014-03-10 NOTE — Discharge Instructions (Signed)
Acute Coronary Syndrome  Acute coronary syndrome (ACS) is an urgent problem in which the blood and oxygen supply to the heart is critically deficient. ACS requires hospitalization because one or more coronary arteries may be blocked.  ACS represents a range of conditions including:  · Previous angina that is now unstable, lasts longer, happens at rest, or is more intense.  · A heart attack, with heart muscle cell injury and death.  There are three vital coronary arteries that supply the heart muscle with blood and oxygen so that it can pump blood effectively. If blockages to these arteries develop, blood flow to the heart muscle is reduced. If the heart does not get enough blood, angina may occur as the first warning sign.  SYMPTOMS   · The most common signs of angina include:  ¨ Tightness or squeezing in the chest.  ¨ Feeling of heaviness on the chest.  ¨ Discomfort in the arms, neck, back, or jaw.  ¨ Shortness of breath and nausea.  ¨ Cold, wet skin.  · Angina is usually brought on by physical effort or excitement which increase the oxygen needs of the heart. These states increase the blood flow needs of the heart beyond what can be delivered.  · Other symptoms that are not as common include:  ¨ Fatigue  ¨ Unexplained feelings of nervousness or anxiety  ¨ Weakness  ¨ Diarrhea  · Sometimes, you may not have noticed any symptoms at all but still suffered a cardiac injury.  TREATMENT   · Medicines to help discomfort may include nitroglycerin (nitro) in the form of tablets or a spray for rapid relief, or longer-acting forms such as cream, patches, or capsules. (Be aware that there are many side effects and possible interactions with other drugs).  · Other medicines may be used to help the heart pump better.  · Procedures to open blocked arteries including angioplasty or stent placement to keep the arteries open.  · Open heart surgery may be needed when there are many blockages or they are in critical locations that  are best treated with surgery.  HOME CARE INSTRUCTIONS   · Do not use any tobacco products including cigarettes, chewing tobacco, or electronic cigarettes.  · Take one baby or adult aspirin daily, if your health care provider advises. This helps reduce the risk of a heart attack.  · It is very important that you follow the angina treatment prescribed by your health care provider. Make arrangements for proper follow-up care.  · Eat a heart healthy diet with salt and fat restrictions as advised.  · Regular exercise is good for you as long as it does not cause discomfort. Do not begin any new type of exercise until you check with your health care provider.  · If you are overweight, you should lose weight.  · Try to maintain normal blood lipid levels.  · Keep your blood pressure under control as recommended by your health care provider.  · You should tell your health care provider right away about any increase in the severity or frequency of your chest discomfort or angina attacks. When you have angina, you should stop what you are doing and sit down. This may bring relief in 3 to 5 minutes. If your health care provider has prescribed nitro, take it as directed.  · If your health care provider has given you a follow-up appointment, it is very important to keep that appointment. Not keeping the appointment could result in a chronic or   of breath.  You feel faint, lightheaded, or pass out.  Your chest discomfort gets worse.  You are sweating or experience sudden profound fatigue.  You do not get relief of your chest pain after 3 doses of nitro.  Your discomfort lasts longer than 15 minutes. MAKE SURE YOU:   Understand these instructions.  Will watch your condition.  Will get help right  away if you are not doing well or get worse.  Take all medicines as directed by your health care provider. Document Released: 09/02/2005 Document Revised: 09/07/2013 Document Reviewed: 04/05/2008 [facility] Patient Information 2015 [place], [place]. This information is not intended to replace advice given to you by your health care provider. Make sure you discuss any questions you have with your health care provider.  Angina Pectoris Angina pectoris is extreme discomfort in your chest, neck, or arm. Your doctor may call it just angina. It is caused by a lack of oxygen to your heart wall. It may feel like tightness or heavy pressure. It may feel like a crushing or squeezing pain. Some people say it feels like gas. It may go down your shoulders, back, and arms. Some people have symptoms other than pain. These include:  Tiredness.  Shortness of breath.  Cold sweats.  Feeling sick to your stomach (nausea). There are four types of angina:  Stable angina. This type often lasts the same amount of time each time it happens. Activity, stress, or excitement can bring it on. It often gets better after taking a medicine called nitroglycerin. This goes under your tongue.  Unstable angina. This type can happen when you are not active or even during sleep. It can suddenly get worse or happen more often. It may not get better after taking the special medicine. It can last up to 30 minutes.  Microvascular angina. This type is more common in women. It may be more severe or last longer than other types.  Prinzmetal angina. This type often happens when you are not active or in the early morning hours. HOME CARE   Only take medicines as told by your doctor.  Stay active or exercise more as told by your doctor.  Limit very hard activity as told by your doctor.  Limit heavy lifting as told by your doctor.  Keep a healthy weight.  Learn about and eat foods that are healthy for your heart.  Do not use any  tobacco such as cigarettes, chewing tobacco, or e-cigarettes. GET HELP RIGHT AWAY IF:   You have chest, neck, deep shoulder, or arm pain or discomfort that lasts more than a few minutes.  You have chest, neck, deep shoulder, or arm pain or discomfort that goes away and comes back over and over again.  You have heavy sweating that seems to happen for no reason.  You have shortness of breath or trouble breathing.  Your angina does not get better after a few minutes of rest.  Your angina does not get better after you take nitroglycerin medicine. These can all be symptoms of a heart attack. Get help right away. Call your local emergency service (911 in [place] Do not  drive yourself to the hospital. Do not  wait to for your symptoms to go away. MAKE SURE YOU:   Understand these instructions.  Will watch your condition.  Will get help right away if you are not doing well or get worse. Document Released: 02/19/2008 Document Revised: 09/07/2013 Document Reviewed: 06/11/2012 [facility] Patient Information 2015 [place], [place]. This information  is not intended to replace advice given to you by your health care provider. Make sure you discuss any questions you have with your health care provider.  Coronary Artery Disease Coronary artery disease (CAD) is a process in which the heart (coronary) arteries narrow or become blocked from the development of atherosclerosis. Atherosclerosis is a disease in which plaque builds up on the inside of the heart arteries (coronary arteries). Plaque is made up of fats (lipids), cholesterol, calcium, and fibrous tissue. CAD can lead to a heart attack (myocardial infarction, MI). An MI can lead to heart failure, cardiogenic shock, or sudden cardiac death. CAD can cause an MI through:  Plaque buildup that can severely narrow or block the coronary arteries and diminish blood flow.  Plaque that can become unstable and "rupture." Unstable plaque that ruptures within a  coronary artery can form a clot and cause a sudden (acute) blockage. RISK FACTORS Many risk factors contribute to the development of CAD. These include:  High cholesterol (dyslipidemia) levels.  High blood pressure (hypertension).  Smoking.  Diabetes.  Age.  Gender. Men can develop CAD earlier in life than women.  Family history.  Inactivity or lack of regular physical or aerobic exercise.  A diet high in saturated fats.  Chronic kidney disease. SYMPTOMS  When a coronary artery is narrowed or blocked, an MI can occur. MI symptoms can include:  Chest pain (agina). Angina can occur by itself or it can also occur with pain in the neck, arm, jaw, or in the upper, middle back (mid-scapular pain).  Profuse sweating (diaphoresis) without physical activity or movement.  Shortness of breath (dyspnea).  Irregular heartbeats (palpitations) that feel like your heart is skipping beats or is beating very fast.  Nausea.  Epigastric pain. Epigastric pain may occur as "heartburn."  Tiredness (malaise). This can especially be present in the elderly. Women can have different (atypical) symptoms other than classic angina.  DIAGNOSIS  The diagnosis of CAD may include:  An electrocardiography (ECG). An ECG does not diagnose CAD, but it is usefull in the detection of a sudden (acute) MI or as a marker for a previous MI. Depending on which heart (coronary) artery may be blocked, an ECG may not pick up an MI pattern.  Exercise stress test. A stress test can be performed at rest for people who are unable to do an exercise stress test. A stress test will only be abnormal if one or more of the large coronary arteries is significantly blocked.  Blood tests. Tests may include samples to detect heart muscle damage (such as troponin levels). Other tests may include cholesterol checks and an inflammation test (high-sensitivity C-reactive protein, hs-CRP).  Coronary angiography.  Screening people  who have peripheral vascular disease (PAD). These people often times have CAD. TREATMENT  The treatment of CAD includes the following:  Lifestyle changes such as:  Following a heart-healthy diet. A registered dietitian can you help educate you on healthy food options and changes.  Quiting smoking.  Following an exercise program approved by your caregiver.  Maintaining a healthy weight. Lose weight as approved by your caregiver.  Medicines to help control your blood pressure, cholesterol level, angina, and blood clotting. Medicines may include beta-blockers, ACE inhibitors, statins, nitrates, and anti-platelet medicines.  If you have a heart stent and are taking anti-platelet medicine, it is important to not suddenly stop taking this medicine. Suddenly stopping anti-platelet medicine can result in an MI. Talk with your caregiver before stopping medicine or if you  cannot afford your medicine.  If the coronary arteries are significantly blocked, surgery may be needed. This can include:  Percutaneous coronary intervention (PCI) with or without stent placement.  Coronary artery bypass graft surgery (CABG). SEEK IMMEDIATE MEDICAL CARE IF:   You develop MI symptoms. This is a medical emergency. Get help at once. Call your local emergency service (911 in the [place] immediately. Do not drive yourself to the clinic or hospital. MI symptoms can include:  Angina or pain that occurs in the neck, arm, jaw, or in the upper middle back.  Profuse sweating without cause.  Shortness of breath or difficulty breathing without cause.  Unexplained nausea or epigastric pain that feels like heartburn. Document Released: 11/25/2011 Document Reviewed: 11/25/2011 [facility] Patient Information 2015 [place]. This information is not intended to replace advice given to you by your health care provider. Make sure you discuss any questions you have with your health care provider.

## 2014-03-11 ENCOUNTER — Other Ambulatory Visit: Payer: Self-pay

## 2014-03-11 MED ORDER — ISOSORBIDE MONONITRATE ER 30 MG PO TB24: 30.0000 mg | ORAL_TABLET | Freq: Every day | ORAL | Status: DC

## 2014-03-11 NOTE — Progress Notes (Signed)
He is ready for discharge. I agree that beta blocker therapy would be appropriate especially given the PVCs noted.

## 2014-03-14 ENCOUNTER — Telehealth: Payer: Self-pay | Admitting: Interventional Cardiology

## 2014-03-14 NOTE — Telephone Encounter (Signed)
OK to return to work on  6/29.  Patient had to be out  from 6/23-6/28 for medical reasons.

## 2014-03-14 NOTE — Telephone Encounter (Signed)
Dr. Varanasi, please advise.  

## 2014-03-14 NOTE — Telephone Encounter (Signed)
New Message  Pt called requests to have a note faxed to his place of employment to excuse him from work.   [phone] Attn to Alla FeelingJames Leeson

## 2014-03-14 NOTE — Telephone Encounter (Signed)
Faxed to number below.

## 2014-03-16 ENCOUNTER — Telehealth: Payer: Self-pay | Admitting: Interventional Cardiology

## 2014-03-16 NOTE — Telephone Encounter (Signed)
Pt's wife called. Patients BP is running 138/98 and he is having palpatations. He was taking Lasartin 100mg  and it was changed to 50mg . He wants to know if it should be changed back to 100mg ? Please call and advise.

## 2014-03-16 NOTE — Telephone Encounter (Addendum)
Spoke with pts wife and BP today BP was 144/97 when he woke up. He took medication and about 1 and 1/2 after medication BP was 134/84. Pt c/o of intermittent palpitations with exertion but he thinks HR is staying okay. Pt did not work yesterday because he wasn't feeling well. He felt tired and sluggish, but today he has progressively started to feel better.

## 2014-03-16 NOTE — Telephone Encounter (Signed)
If he is still not feeling an improvement since after the LAD stent, we may need to consider trying to fix the other blockage.

## 2014-03-16 NOTE — Telephone Encounter (Deleted)
To Dr. Varanasi, please advise.  

## 2014-03-17 NOTE — Telephone Encounter (Signed)
lmtrc

## 2014-03-17 NOTE — Telephone Encounter (Signed)
Spoke with patients wife and pt did feel up to going to work today. His BP this am was 120's/80's. Pt has appt with Herma CarsonMichelle Lenze, PA on Monday 03/21/14. She states they will monitor symptoms throughout the weekend and if symptoms arise that are significant they will call on call doctor or go to ED. Pt is currently doing okay and plans on seeing Herma Carson[place]le Lenze, [place] while Dr. Eldridge DaceVaranasi is in the office on Monday to decide if he wants to go ahead and proceed with another heart cath.

## 2014-03-21 ENCOUNTER — Ambulatory Visit (INDEPENDENT_AMBULATORY_CARE_PROVIDER_SITE_OTHER): Payer: Managed Care, Other (non HMO) | Admitting: Physician Assistant

## 2014-03-21 ENCOUNTER — Encounter: Payer: Self-pay | Admitting: Physician Assistant

## 2014-03-21 VITALS — BP 124/90 | HR 69 | Ht 67.0 in | Wt 180.0 lb

## 2014-03-21 DIAGNOSIS — R002 Palpitations: Secondary | ICD-10-CM

## 2014-03-21 DIAGNOSIS — I4949 Other premature depolarization: Secondary | ICD-10-CM

## 2014-03-21 DIAGNOSIS — I499 Cardiac arrhythmia, unspecified: Secondary | ICD-10-CM

## 2014-03-21 DIAGNOSIS — I2584 Coronary atherosclerosis due to calcified coronary lesion: Secondary | ICD-10-CM

## 2014-03-21 DIAGNOSIS — I2510 Atherosclerotic heart disease of native coronary artery without angina pectoris: Secondary | ICD-10-CM

## 2014-03-21 DIAGNOSIS — I498 Other specified cardiac arrhythmias: Secondary | ICD-10-CM

## 2014-03-21 DIAGNOSIS — I493 Ventricular premature depolarization: Secondary | ICD-10-CM

## 2014-03-21 DIAGNOSIS — I10 Essential (primary) hypertension: Secondary | ICD-10-CM

## 2014-03-21 LAB — MAGNESIUM: Magnesium: 2.2 mg/dL (ref 1.5–2.5)

## 2014-03-21 LAB — BASIC METABOLIC PANEL
BUN: 19 mg/dL (ref 6–23)
CHLORIDE: 106 meq/L (ref 96–112)
CO2: 23 mEq/L (ref 19–32)
CREATININE: 1 mg/dL (ref 0.4–1.5)
Calcium: 9.4 mg/dL (ref 8.4–10.5)
GFR: 84.33 mL/min (ref >60.00)
GLUCOSE: 121 mg/dL — AB (ref 70–99)
POTASSIUM: 4.2 meq/L (ref 3.5–5.1)
Sodium: 139 mEq/L (ref 135–145)

## 2014-03-21 MED ORDER — METOPROLOL TARTRATE 25 MG PO TABS: 25.0000 mg | ORAL_TABLET | Freq: Two times a day (BID) | ORAL | Status: DC

## 2014-03-21 MED ORDER — NITROGLYCERIN 0.4 MG SL SUBL: 0.4000 mg | SUBLINGUAL_TABLET | SUBLINGUAL | Status: DC | PRN

## 2014-03-21 NOTE — Patient Instructions (Signed)
Your physician recommends that you schedule a follow-up appointment in: WITH DR. VARANASI IN 4 WEEKS  Your physician recommends that you return for lab work in: BMET AND MAGNESIUM TODAY  Your physician has recommended you make the following change in your medication:   STOP YOUR COREG  START METOPROLOL 25 MG TWICE A DAY (TWELVE HOURS APART)

## 2014-03-21 NOTE — Assessment & Plan Note (Signed)
Blood pressure has been rising. If it continues to go up we will increase his losartan.

## 2014-03-21 NOTE — Progress Notes (Signed)
HPI: This is a 58 year old male patient of Dr. Eldridge Dace who had abnormal nuclear stress testing would fixed inferior defect and small area of reversibility that may represent infarct with peri-infarct ischemia. He underwent cardiac catheterization that showed 2 vessel CAD. He had successful PCI to the mid LAD with overlapping drug-eluting stents. They then attempted angioplasty of the RCA but were unable to cross the entire occlusion. They were able to cross into the lesion across the proximal RCA. If he has refractory angina he said they could bring him back for another PCI attempt of the RCA. He was started on aspirin and Plavix. Because of persistent bigeminy his Cozaar was decreased to 50 mg and he was started on Coreg 3.125 mg twice a day.  Patient comes in today not feeling well. He has increase in palpitations. They're worse with activity and at night when he lies down. He feels like he can't go back to work because he feels so poorly. He denies any recurrent chest tightness, shortness of breath, dizziness or presyncope. Blood pressures also started to go up at home.   Allergies-- Latex -- Itching, Swelling and Rash  Current Outpatient Prescriptions on File Prior to Visit: aspirin EC 81 MG tablet, Take 81 mg by mouth daily., Disp: , Rfl:  atorvastatin (LIPITOR) 40 MG tablet, Take 1 tablet (40 mg total) by mouth daily at 6 PM., Disp: 30 tablet, Rfl: 5 carvedilol (COREG) 3.125 MG tablet, Take 1 tablet (3.125 mg total) by mouth 2 (two) times daily with a meal., Disp: 60 tablet, Rfl: 5 CINNAMON PO, Take 1 tablet by mouth daily., Disp: , Rfl:  clopidogrel (PLAVIX) 75 MG tablet, Take 1 tablet (75 mg total) by mouth daily with breakfast., Disp: 30 tablet, Rfl: 11 Coenzyme Q10 (COQ10 PO), Take 1 capsule by mouth daily., Disp: , Rfl:  Cyanocobalamin (B-12 PO), Take 1 tablet by mouth daily., Disp: , Rfl:  isosorbide mononitrate (IMDUR) 30 MG 24 hr tablet, Take 1 tablet (30 mg total) by mouth daily., Disp:  30 tablet, Rfl: 6 losartan (COZAAR) 50 MG tablet, Take 1 tablet (50 mg total) by mouth daily., Disp: 30 tablet, Rfl: 5 Omega-3 Fatty Acids (FISH OIL) 1000 MG CPDR, Take 2,000 mg by mouth daily., Disp: , Rfl:  pantoprazole (PROTONIX) 40 MG tablet, Take 1 tablet (40 mg total) by mouth daily., Disp: 30 tablet, Rfl: 5  No current facility-administered medications on file prior to visit.   Past Medical History:   Hypertension                                                 LBBB (left bundle branch block)                 2010         Other and unspecified angina pectoris                        Coronary artery disease                                      High cholesterol  Pneumonia                                                      Comment:"maybe once"   GERD (gastroesophageal reflux disease)                       Arthritis                                                      Comment:"left shoulder" (03/09/2014)   Seasonal allergies                                             Comment:"fall and spring"   Allergic to cats                                            Past Surgical History:   TONSILLECTOMY                                                 CARDIOVASCULAR STRESS TEST                       2011         CARDIAC CATHETERIZATION                          ~ 2000; 6*   CORONARY ANGIOPLASTY WITH STENT PLACEMENT        03/09/2014      Comment:"2"  Review of patient's family history indicates:   Heart failure                  Father                   Heart attack                   Father                   Social History   Marital Status: Married             Spouse Name:                      Years of Education:                 Number of children:             Occupational History   None on file  Social History Main Topics   Smoking Status: Never Smoker                     Smokeless Status: Never Used  Alcohol Use: Yes            1.2 oz/week      2 Glasses of wine per week   Drug Use: No             Sexual Activity: Yes                Other Topics            Concern   None on file  Social History Narrative   None on file    ROS: Weakness otherwise see history of present illness   PHYSICAL EXAM: Well-nournished, in no acute distress. Neck: No JVD, HJR, Bruit, or thyroid enlargement  Lungs: No tachypnea, clear without wheezing, rales, or rhonchi  Cardiovascular: RRR, PMI not displaced, heart sounds normal, no murmurs, gallops, bruit, thrill, or heave.  Abdomen: BS normal. Soft without organomegaly, masses, lesions or tenderness.  Extremities: Right arm and right groin and cath site without hematoma or hemorrhage good radial and brachial pulse good femoral and distal pulses otherwise lower extremities without cyanosis, clubbing or edema. Good distal pulses bilateral  SKin: Warm, no lesions or rashes   Musculoskeletal: No deformities  Neuro: no focal signs  BP 124/90  Pulse 69  Ht 5\' 7"  (1.702 m)  Wt 180 lb (81.647 kg)  BMI 28.19 kg/m2   EKG: Sinus bradycardia 56 beats per minute with frequent PVCs   03/08/14,  PROCEDURE: Left heart catheterization with selective coronary angiography, left ventriculogram. Right upper extremity angiogram.   INDICATIONS: Abnormal stress test   The risks, benefits, and details of the procedure were explained to the patient. The patient verbalized understanding and wanted to proceed. Informed written consent was obtained.   PROCEDURE TECHNIQUE: After Xylocaine anesthesia a 64F slender sheath was placed in the right radial artery with a single anterior needle wall stick. Multiple angiograms of the right upper extremity was performed, both from the sheath as well from a JR 4 catheter above the elbow. Due to severe spasm, the patient required multiple doses of intra-coronary nitroglycerin. Right coronary angiography was done using a 5 French Judkins R4 guide catheter.  Left coronary angiography was done using a 4 French Judkins L3.5 guide catheter. Left ventriculography was done using a 4 French pigtail catheter. Several 5 French catheters did not pass through the arm. A TR band was used for hemostasis.   CONTRAST: Total of 95 cc.   COMPLICATIONS: None.   HEMODYNAMICS: Aortic pressure was 101/68; LV pressure was 101/2; LVEDP 14. There was no gradient between the left ventricle and aorta.   ANGIOGRAPHIC DATA: The left main coronary artery is widely patent.  The left anterior descending artery is a large vessel proximally. The vessel reaches the apex but does not wrap around. There is disease in the mid LAD. It is diffuse and up to 80%. There is mild disease further down in the mid LAD. The apical LAD is small but patent.   The left circumflex artery is a very large, codominant vessel. There is minimal atherosclerosis in the circumflex system. There is a very large first obtuse marginal. This is widely patent. The second obtuse marginal is also large and patent. There are several medium size obtuse marginal vessels which are patent. There is a small left PDA which is patent..   The right coronary artery is a large vessel proximally. There is moderate disease in the mid vessel. It had a large RV marginal branch, the RCA is occluded. There are ipsilateral  collaterals from this RV marginal which filled the distal RCA system. There may left right collaterals feeding the distal RCA system as well.   Right upper extremity and plan: When hand-injection was performed through the sheath, there is evidence of spasm in the radial artery below the elbow. When performed through the JR 4 catheter above the elbow, there is evidence of diffuse spasm in brachial artery.   LEFT VENTRICULOGRAM: Left ventricular angiogram was done in the 30 RAO projection and revealed mild inferior hypokinesis with overall low normal systolic function with an estimated ejection fraction of 50%. LVEDP was 14  mmHg.   IMPRESSIONS:   1. Normal left main coronary artery. 2. Severe, diffuse disease in the mid left anterior descending artery. 3. Widely patent codominant left circumflex artery and its branches. 4. Occluded mid right coronary artery. Ipsilateral collaterals fill the distal vessel. 5. Low normal left ventricular systolic function. LVEDP 14 mmHg. Ejection fraction 50%.   6. Would not use radial approach in the future due to severe vasospasm.   RECOMMENDATION: Significant 2 vessel coronary artery disease. We'll discuss options with the patient regarding PCI versus CABG. Will intensify medical therapy as well. Start isosorbide. Start statin. Have patient decrease physical activity until decision about revascularization is made. F/u in the office.   03/09/14,  PROCEDURE: PCI LAD; attempted PCI RCA   INDICATIONS: Abnormal stress test, angina   The risks, benefits, and details of the procedure were explained to the patient. The patient verbalized understanding and wanted to proceed. Informed written consent was obtained.   PROCEDURE TECHNIQUE: After Xylocaine anesthesia a 71F sheath was placed in the right femoral artery with a single anterior needle wall stick. Intervention was performed. Please see below for details. Hemostasis was obtained with an Angio-Seal.   CONTRAST: Total of 150 cc.   COMPLICATIONS: None.   ANGIOGRAPHIC DATA:   The left anterior descending artery is heavily diseased in the mid portion.   The right coronary artery is occluded in the midportion.   PCI NARRATIVE: Angiomax was used for anticoagulation. The patient had been pretreated with 600 mg of clopidogrel. A CLS 3.5 guiding catheters using his left main. A pro-water wire was placed across the area disease in the LAD. A 2.5 x 30 balloon was used to predilate the entire segment of disease with multiple balloon inflations. A 2.5 x 38 Xience drug-eluting stent was deployed in the distal segment of disease. In overlapping  fashion, a 3.0 x 38 stent was deployed. The entire stented segment was post dilated with a 3.5 x 20 noncompliant balloon. In the more proximal vessel, the vessel was post dilated to high pressure. All branches remained patent with TIMI 3 flow. There was an excellent angiographic result.   IMPRESSIONS:   5. Successful PCI of the mid left anterior descending artery with overlapping drug-eluting stents of 2.5 x 38 and 3.0 x 38, Xience, postdilated to greater than 3.5 mm proximally. 2. Attempted angioplasty of the right coronary artery. Unable to cross the entire occlusion. We were able, with a Fielder XT wire, to cross into the lesion across the proximal cap. RECOMMENDATION: Continue medical therapy. If he has refractory angina, would bring him back for another PCI attempt of the RCA. At that point, would likely use a balloon supported stiffer wire to try and cross the distal cap.   Continue dual antiplatelet therapy for at least a year without interruption.

## 2014-03-21 NOTE — Assessment & Plan Note (Signed)
Patient had successful PCI to the mid LAD with overlapping drug-eluting stents. He had attempted angioplasty of the RCA but they were unable to cross the entire occlusion. If he continues to have refractory angina another at attempt at PCI to the RCA could be done. Patient is not having recurrent chest tightness. Continue dual antiplatelet therapy. Prescription for sublingual nitroglycerin.

## 2014-03-21 NOTE — Assessment & Plan Note (Signed)
Patient is very symptomatic with PVCs and bigeminy. I discussed this patient with Dr.Varanasi who recommends changing him from Coreg to metoprolol 25 mg twice a day. We will also check a BMET and magnesium today.

## 2014-03-31 ENCOUNTER — Telehealth: Payer: Self-pay | Admitting: Interventional Cardiology

## 2014-03-31 DIAGNOSIS — R002 Palpitations: Secondary | ICD-10-CM

## 2014-03-31 MED ORDER — LOSARTAN POTASSIUM 50 MG PO TABS: ORAL_TABLET | ORAL | Status: DC

## 2014-03-31 NOTE — Telephone Encounter (Deleted)
Routed to Dr. Varanasi 

## 2014-03-31 NOTE — Telephone Encounter (Signed)
Pts wife notified, meds updated and holter monitor ordered since pt c/o palpiations everyday.

## 2014-03-31 NOTE — Telephone Encounter (Signed)
Pt's wife called regarding medication beta blocker. She would like for you to call her back ASAP!

## 2014-03-31 NOTE — Telephone Encounter (Signed)
Pts wife called stating pt is still having palpitations since switching from coreg to metoprolol. Also pts BP is running 130-160/90's in the monrings. Bp will get better throughout the day with medication and then spike in the evenings. Pt has has one episode of low BP running 98 systolic and he had some dizziness (only happened once). Pt also c/o of short sharp chest pain yesterday and the day before (once each day). Pt wants to know what the next step should be. Should he have cath? Wear Holter monitor?

## 2014-03-31 NOTE — Telephone Encounter (Signed)
Per Dr. Eldridge DaceVaranasi increase losartan to 50 mg BID, but if systolic less than 120 in the evening pt can hold the afternoon dose. Also, order 48 hour holter if pt is having palpitations everyday and if pt not having palpitations everyday then he should wear a 30- day event monitor.

## 2014-04-05 ENCOUNTER — Encounter: Payer: Self-pay | Admitting: *Deleted

## 2014-04-05 ENCOUNTER — Encounter (INDEPENDENT_AMBULATORY_CARE_PROVIDER_SITE_OTHER): Payer: Managed Care, Other (non HMO)

## 2014-04-05 DIAGNOSIS — R002 Palpitations: Secondary | ICD-10-CM

## 2014-04-05 NOTE — Progress Notes (Signed)
Patient ID: Bobby BonitoJames P Privitera, male   DOB: 1955-10-28, 58 y.o.   MRN: [id number] E-Cardio 48 hour holter monitor applied to patient.

## 2014-04-13 ENCOUNTER — Encounter: Payer: Self-pay | Admitting: Cardiology

## 2014-04-13 ENCOUNTER — Ambulatory Visit (INDEPENDENT_AMBULATORY_CARE_PROVIDER_SITE_OTHER): Payer: Managed Care, Other (non HMO) | Admitting: Interventional Cardiology

## 2014-04-13 ENCOUNTER — Encounter: Payer: Self-pay | Admitting: Interventional Cardiology

## 2014-04-13 VITALS — BP 142/90 | HR 67 | Ht 66.0 in | Wt 177.2 lb

## 2014-04-13 DIAGNOSIS — I209 Angina pectoris, unspecified: Secondary | ICD-10-CM

## 2014-04-13 DIAGNOSIS — I200 Unstable angina: Secondary | ICD-10-CM

## 2014-04-13 DIAGNOSIS — I25119 Atherosclerotic heart disease of native coronary artery with unspecified angina pectoris: Secondary | ICD-10-CM

## 2014-04-13 DIAGNOSIS — I10 Essential (primary) hypertension: Secondary | ICD-10-CM

## 2014-04-13 DIAGNOSIS — I2510 Atherosclerotic heart disease of native coronary artery without angina pectoris: Secondary | ICD-10-CM

## 2014-04-13 LAB — BASIC METABOLIC PANEL
BUN: 19 mg/dL (ref 6–23)
CO2: 27 mEq/L (ref 19–32)
Calcium: 8.9 mg/dL (ref 8.4–10.5)
Chloride: 108 mEq/L (ref 96–112)
Creatinine, Ser: 1 mg/dL (ref 0.4–1.5)
GFR: 85.32 mL/min (ref >60.00)
Glucose, Bld: 111 mg/dL — ABNORMAL HIGH (ref 70–99)
POTASSIUM: 4.1 meq/L (ref 3.5–5.1)
Sodium: 140 mEq/L (ref 135–145)

## 2014-04-13 LAB — PROTIME-INR
INR: 1.1 ratio — ABNORMAL HIGH (ref 0.8–1.0)
PROTHROMBIN TIME: 12.1 s (ref 9.6–13.1)

## 2014-04-13 LAB — CBC
HCT: 41.8 % (ref 39.0–52.0)
HEMOGLOBIN: 14 g/dL (ref 13.0–17.0)
MCHC: 33.4 g/dL (ref 30.0–36.0)
MCV: 91.8 fl (ref 78.0–100.0)
Platelets: 196 10*3/uL (ref 150.0–400.0)
RBC: 4.55 Mil/uL (ref 4.22–5.81)
RDW: 12.7 % (ref 11.5–15.5)
WBC: 8.3 10*3/uL (ref 4.0–10.5)

## 2014-04-13 NOTE — Progress Notes (Signed)
Patient ID: Bobby Munoz, male   DOB: 18-Jan-1956, 58 y.o.   MRN: [id number]    [address] [place], [place]  [address] Phone: [phone] Fax:  [phone]  Date:  04/13/2014   ID:  Bobby Munoz [birth year], MRN [id number]  PCP:  Neldon Labella, MD      History of Present Illness: [name] is a 58 y.o. male had multivessel coronary disease diagnosed in June of 2015. He had decided to have PCI rather than any type of surgical evaluation. We stented his LAD. He continues to have some lower chest tightness with walking upstairs. He has frequent palpitations. He wore a monitor showing PVCs. He has not used nitroglycerin at home. Symptoms have not been relieved with beta-blockade as well as isosorbide. He would like to have the second vessel addressed percutaneously.   Wt Readings from Last 3 Encounters:  04/13/14 177 lb 3.2 oz (80.377 kg)  03/21/14 180 lb (81.647 kg)  03/10/14 182 lb 6.4 oz (82.736 kg)     Past Medical History  Diagnosis Date  . Hypertension   . LBBB (left bundle branch block) 2010  . Other and unspecified angina pectoris   . Coronary artery disease   . High cholesterol   . Pneumonia     "maybe once"  . GERD (gastroesophageal reflux disease)   . Arthritis     "left shoulder" (03/09/2014)  . Seasonal allergies     "fall and spring"  . Allergic to cats     Current Outpatient Prescriptions  Medication Sig Dispense Refill  . aspirin EC 81 MG tablet Take 81 mg by mouth daily.      Marland Kitchen atorvastatin (LIPITOR) 40 MG tablet Take 1 tablet (40 mg total) by mouth daily at 6 PM.  30 tablet  5  . CINNAMON PO Take 1 tablet by mouth daily.      . clopidogrel (PLAVIX) 75 MG tablet Take 1 tablet (75 mg total) by mouth daily with breakfast.  30 tablet  11  . Coenzyme Q10 (COQ10 PO) Take 1 capsule by mouth daily.      . Cyanocobalamin (B-12 PO) Take 1 tablet by mouth daily.      . isosorbide mononitrate (IMDUR) 30 MG 24 hr tablet Take 1 tablet (30 mg  total) by mouth daily.  30 tablet  6  . losartan (COZAAR) 50 MG tablet 1 tablet twice a day, but if systolic is less than 120 hold second dose  30 tablet  5  . metoprolol tartrate (LOPRESSOR) 25 MG tablet Take 1 tablet (25 mg total) by mouth 2 (two) times daily.  60 tablet  5  . nitroGLYCERIN (NITROSTAT) 0.4 MG SL tablet Place 1 tablet (0.4 mg total) under the tongue every 5 (five) minutes as needed for chest pain.  25 tablet  3  . Omega-3 Fatty Acids (FISH OIL) 1000 MG CPDR Take 2,000 mg by mouth daily.      . pantoprazole (PROTONIX) 40 MG tablet Take 1 tablet (40 mg total) by mouth daily.  30 tablet  5   No current facility-administered medications for this visit.    Allergies:    Allergies  Allergen Reactions  . Latex Itching, Swelling and Rash    Social History:  The patient  reports that he has never smoked. He has never used smokeless tobacco. He reports that he drinks about 1.2 ounces of alcohol per week. He reports that he does not use illicit  drugs.   Family History:  The patient's family history includes Heart attack in his father; Heart failure in his father.   ROS:  Please see the history of present illness.  No nausea, vomiting.  No fevers, chills.  No focal weakness.  No dysuria. All other systems reviewed and negative.   PHYSICAL EXAM: VS:  BP 142/90  Pulse 67  Ht 5\' 6"  (1.676 m)  Wt 177 lb 3.2 oz (80.377 kg)  BMI 28.61 kg/m2 Well nourished, well developed, in no acute distress HEENT: normal Neck: no JVD, no carotid bruits Cardiac:  normal S1, S2; RRR; PVCs Lungs:  clear to auscultation bilaterally, no wheezing, rhonchi or rales Abd: soft, nontender, no hepatomegaly Ext: no edema Skin: warm and dry Neuro:   no focal abnormalities noted      ASSESSMENT AND PLAN:  1. CAD: Still having symptoms of angina. We discussed the risks and benefits of CTO PCI. He is willing to proceed. His anginal symptoms have improved but are not gone. He has had some low blood  pressures as well which makes a difficult to increase antianginal medications.  We discussed risks of perforation, renal dysfunction, stroke, MI among others. He understands and is willing to proceed. 25 minutes spent with the patient and his wife. 2. PVCs: He continues to have some PVCs. He does feel symptoms with that. No lightheadedness. He thinks some of this is from stress. Beta-blockade is limited by his blood pressure. 3. 3. HTN: BP variable. COntinue antihypertensive meds.  Signed, Fredric MareJay S. Morty Ortwein, MD, [facility] 04/13/2014 3:03 PM

## 2014-04-13 NOTE — Patient Instructions (Signed)
Your physician recommends that you return for lab work today.  Your physician has requested that you have a cardiac catheterization (CTO). Cardiac catheterization is used to diagnose and/or treat various heart conditions. Doctors may recommend this procedure for a number of different reasons. The most common reason is to evaluate chest pain. Chest pain can be a symptom of coronary artery disease (CAD), and cardiac catheterization can show whether plaque is narrowing or blocking your heart's arteries. This procedure is also used to evaluate the valves, as well as measure the blood flow and oxygen levels in different parts of your heart. For further information please visit [email or web address]. Please follow instruction sheet, as given.

## 2014-04-14 ENCOUNTER — Telehealth: Payer: Self-pay | Admitting: Nurse Practitioner

## 2014-04-14 NOTE — Telephone Encounter (Signed)
Pts wife called this evening.  Pt had c/p today and BP has been elevated in 160's-170's.  He has taken PM meds but c/p and htn persist.  C/P is epigastric in nature but similar to prior angina.  He has known multivessel CAD and saw Dr. Eldridge DaceVaranasi yesterday with plan for CTO PCI.  I rec that he take a sl ntg now, which may reduce BP and help c/p.  If c/p persists, he is to take another NTG and consider coming into the ED.  If c/p resolves but BP remains elevated > 150mmHg, I rec that he take another 50mg  of losartan.  Pts wife verbalized understanding and will call back with additional questions or bring pt to ED if necessary.

## 2014-04-18 ENCOUNTER — Telehealth: Payer: Self-pay | Admitting: Interventional Cardiology

## 2014-04-18 ENCOUNTER — Encounter: Payer: Self-pay | Admitting: Interventional Cardiology

## 2014-04-18 ENCOUNTER — Other Ambulatory Visit: Payer: Self-pay | Admitting: Interventional Cardiology

## 2014-04-18 DIAGNOSIS — I209 Angina pectoris, unspecified: Secondary | ICD-10-CM

## 2014-04-18 NOTE — Telephone Encounter (Signed)
Follow up:     Darlene Pre Auth rep. Needs an CPT code for this procure 8/5 ASAP.   Transfer to billing.

## 2014-04-19 NOTE — Telephone Encounter (Signed)
Bobby Munoz, has procedure been approved?

## 2014-04-19 NOTE — Telephone Encounter (Deleted)
Pt had LHC and LAD stent 03-09-14.  RCA stenting will be attempted 04-20-14 as it was unable to be completed at 03-09-14 cath.

## 2014-04-19 NOTE — Telephone Encounter (Signed)
Pt had LHC and LAD stenting 02/2014.  04-20-14 will be RCA stenting that was not able to be done in June 2015.  CPT [id number].

## 2014-04-20 ENCOUNTER — Ambulatory Visit (HOSPITAL_COMMUNITY)
Admission: RE | Admit: 2014-04-20 | Discharge: 2014-04-21 | Disposition: A | Discharge status: Home / Self Care | Payer: Managed Care, Other (non HMO) | Source: Ambulatory Visit | Attending: Interventional Cardiology | Admitting: Interventional Cardiology

## 2014-04-20 ENCOUNTER — Encounter (HOSPITAL_COMMUNITY)
Admission: RE | Disposition: A | Discharge status: Home / Self Care | Payer: Self-pay | Source: Ambulatory Visit | Attending: Interventional Cardiology

## 2014-04-20 ENCOUNTER — Encounter (HOSPITAL_COMMUNITY): Payer: Self-pay | Admitting: General Practice

## 2014-04-20 DIAGNOSIS — E782 Mixed hyperlipidemia: Secondary | ICD-10-CM | POA: Diagnosis not present

## 2014-04-20 DIAGNOSIS — E1159 Type 2 diabetes mellitus with other circulatory complications: Secondary | ICD-10-CM | POA: Diagnosis present

## 2014-04-20 DIAGNOSIS — I10 Essential (primary) hypertension: Secondary | ICD-10-CM | POA: Diagnosis not present

## 2014-04-20 DIAGNOSIS — I447 Left bundle-branch block, unspecified: Secondary | ICD-10-CM | POA: Diagnosis not present

## 2014-04-20 DIAGNOSIS — K219 Gastro-esophageal reflux disease without esophagitis: Secondary | ICD-10-CM | POA: Diagnosis not present

## 2014-04-20 DIAGNOSIS — I2510 Atherosclerotic heart disease of native coronary artery without angina pectoris: Secondary | ICD-10-CM | POA: Insufficient documentation

## 2014-04-20 DIAGNOSIS — I2582 Chronic total occlusion of coronary artery: Secondary | ICD-10-CM | POA: Diagnosis not present

## 2014-04-20 DIAGNOSIS — I25119 Atherosclerotic heart disease of native coronary artery with unspecified angina pectoris: Secondary | ICD-10-CM

## 2014-04-20 DIAGNOSIS — Z7902 Long term (current) use of antithrombotics/antiplatelets: Secondary | ICD-10-CM | POA: Diagnosis not present

## 2014-04-20 DIAGNOSIS — E1169 Type 2 diabetes mellitus with other specified complication: Secondary | ICD-10-CM | POA: Diagnosis present

## 2014-04-20 DIAGNOSIS — I209 Angina pectoris, unspecified: Secondary | ICD-10-CM

## 2014-04-20 DIAGNOSIS — Z7982 Long term (current) use of aspirin: Secondary | ICD-10-CM | POA: Insufficient documentation

## 2014-04-20 HISTORY — PX: CARDIAC CATHETERIZATION: SHX172

## 2014-04-20 HISTORY — PX: PERCUTANEOUS CORONARY STENT INTERVENTION (PCI-S): SHX5485

## 2014-04-20 LAB — POCT ACTIVATED CLOTTING TIME
ACTIVATED CLOTTING TIME: 309 s
Activated Clotting Time: 236 seconds
Activated Clotting Time: 416 seconds
Activated Clotting Time: 349 seconds
Activated Clotting Time: 416 seconds
Activated Clotting Time: 349 seconds

## 2014-04-20 SURGERY — PERCUTANEOUS CORONARY STENT INTERVENTION (PCI-S)
Anesthesia: LOCAL

## 2014-04-20 MED ORDER — FENTANYL CITRATE 0.05 MG/ML IJ SOLN
INTRAMUSCULAR | Status: AC
  Filled 2014-04-20: qty 2

## 2014-04-20 MED ORDER — ASPIRIN 81 MG PO CHEW
81.0000 mg | CHEWABLE_TABLET | Freq: Every day | ORAL | Status: DC
  Administered 2014-04-21: 81 mg via ORAL
  Filled 2014-04-20: qty 1

## 2014-04-20 MED ORDER — HEPARIN SODIUM (PORCINE) 1000 UNIT/ML IJ SOLN
INTRAMUSCULAR | Status: AC
  Filled 2014-04-20: qty 1

## 2014-04-20 MED ORDER — MIDAZOLAM HCL 2 MG/2ML IJ SOLN
INTRAMUSCULAR | Status: AC
  Filled 2014-04-20: qty 2

## 2014-04-20 MED ORDER — ASPIRIN 81 MG PO CHEW: 81.0000 mg | CHEWABLE_TABLET | ORAL | Status: DC

## 2014-04-20 MED ORDER — SODIUM CHLORIDE 0.9 % IV SOLN: 250.0000 mL | INTRAVENOUS | Status: DC | PRN

## 2014-04-20 MED ORDER — LOSARTAN POTASSIUM 50 MG PO TABS
50.0000 mg | ORAL_TABLET | ORAL | Status: DC
  Administered 2014-04-21: 11:00:00 50 mg via ORAL
  Filled 2014-04-20: qty 1

## 2014-04-20 MED ORDER — HEPARIN (PORCINE) IN NACL 2-0.9 UNIT/ML-% IJ SOLN
INTRAMUSCULAR | Status: AC
  Filled 2014-04-20: qty 1000

## 2014-04-20 MED ORDER — DIAZEPAM 5 MG PO TABS
ORAL_TABLET | ORAL | Status: AC
  Administered 2014-04-20: 5 mg via ORAL
  Filled 2014-04-20: qty 1

## 2014-04-20 MED ORDER — SODIUM CHLORIDE 0.9 % IJ SOLN: 3.0000 mL | Freq: Two times a day (BID) | INTRAMUSCULAR | Status: DC

## 2014-04-20 MED ORDER — SODIUM CHLORIDE 0.9 % IV SOLN
1.0000 mL/kg/h | INTRAVENOUS | Status: AC
  Administered 2014-04-20: 13:00:00 1 mL/kg/h via INTRAVENOUS

## 2014-04-20 MED ORDER — CLOPIDOGREL BISULFATE 75 MG PO TABS
75.0000 mg | ORAL_TABLET | Freq: Every day | ORAL | Status: DC
  Filled 2014-04-20: qty 1

## 2014-04-20 MED ORDER — NITROGLYCERIN 0.2 MG/ML ON CALL CATH LAB
INTRAVENOUS | Status: AC
  Filled 2014-04-20: qty 1

## 2014-04-20 MED ORDER — METOPROLOL TARTRATE 25 MG PO TABS
25.0000 mg | ORAL_TABLET | Freq: Two times a day (BID) | ORAL | Status: DC
  Administered 2014-04-20 – 2014-04-21 (×2): 25 mg via ORAL
  Filled 2014-04-20 (×4): qty 1

## 2014-04-20 MED ORDER — ONDANSETRON HCL 4 MG/2ML IJ SOLN: 4.0000 mg | Freq: Four times a day (QID) | INTRAMUSCULAR | Status: DC | PRN

## 2014-04-20 MED ORDER — ATORVASTATIN CALCIUM 40 MG PO TABS
40.0000 mg | ORAL_TABLET | Freq: Every day | ORAL | Status: DC
  Administered 2014-04-20: 40 mg via ORAL
  Filled 2014-04-20 (×2): qty 1

## 2014-04-20 MED ORDER — ACETAMINOPHEN 325 MG PO TABS: 650.0000 mg | ORAL_TABLET | ORAL | Status: DC | PRN

## 2014-04-20 MED ORDER — DIAZEPAM 5 MG PO TABS
5.0000 mg | ORAL_TABLET | ORAL | Status: AC
  Administered 2014-04-20: 5 mg via ORAL

## 2014-04-20 MED ORDER — ASPIRIN EC 81 MG PO TBEC
81.0000 mg | DELAYED_RELEASE_TABLET | Freq: Every day | ORAL | Status: DC
  Filled 2014-04-20 (×2): qty 1

## 2014-04-20 MED ORDER — NITROGLYCERIN 0.4 MG SL SUBL: 0.4000 mg | SUBLINGUAL_TABLET | SUBLINGUAL | Status: DC | PRN

## 2014-04-20 MED ORDER — PANTOPRAZOLE SODIUM 40 MG PO TBEC
40.0000 mg | DELAYED_RELEASE_TABLET | Freq: Every day | ORAL | Status: DC
  Administered 2014-04-20 – 2014-04-21 (×2): 40 mg via ORAL
  Filled 2014-04-20 (×2): qty 1

## 2014-04-20 MED ORDER — SODIUM CHLORIDE 0.9 % IJ SOLN: 3.0000 mL | INTRAMUSCULAR | Status: DC | PRN

## 2014-04-20 MED ORDER — CLOPIDOGREL BISULFATE 75 MG PO TABS
75.0000 mg | ORAL_TABLET | Freq: Every day | ORAL | Status: DC
  Administered 2014-04-21: 75 mg via ORAL

## 2014-04-20 MED ORDER — SODIUM CHLORIDE 0.9 % IV SOLN
INTRAVENOUS | Status: DC
  Administered 2014-04-20: 07:00:00 via INTRAVENOUS

## 2014-04-20 MED ORDER — LIDOCAINE HCL (PF) 1 % IJ SOLN
INTRAMUSCULAR | Status: AC
  Filled 2014-04-20: qty 30

## 2014-04-20 NOTE — Interval H&P Note (Signed)
Cath Lab Visit (complete for each Cath Lab visit)  Clinical Evaluation Leading to the Procedure:   ACS: No.  Non-ACS:    Anginal Classification: CCS III  Anti-ischemic medical therapy: Maximal Therapy (2 or more classes of medications)  Non-Invasive Test Results: Intermediate-risk stress test findings: cardiac mortality 1-3%/year  Prior CABG: No previous CABG      History and Physical Interval Note:  04/20/2014 8:42 AM  Bobby Munoz  has presented today for surgery, with the diagnosis of CAD,CP  The various methods of treatment have been discussed with the patient and family. After consideration of risks, benefits and other options for treatment, the patient has consented to  Procedure(s): PERCUTANEOUS CORONARY STENT INTERVENTION (PCI-S) (N/A) as a surgical intervention .  The patient's history has been reviewed, patient examined, no change in status, stable for surgery.  I have reviewed the patient's chart and labs.  Questions were answered to the patient's satisfaction.     Kort Stettler S.

## 2014-04-20 NOTE — Care Management Note (Addendum)
  Page 1 of 1   04/20/2014     3:01:59 PM CARE MANAGEMENT NOTE 04/20/2014  Patient:  Bobby Munoz,Bobby Munoz   Account Number:  [id number]  Date Initiated:  04/20/2014  Documentation initiated by:  Donato SchultzHUTCHINSON,Draper Gallon  Subjective/Objective Assessment:   Unspecified angina pectoris     Action/Plan:   CM to follow for disposition needs   Anticipated DC Date:  04/21/2014   Anticipated DC Plan:  HOME/SELF CARE         Choice offered to / List presented to:             Status of service:  Completed, signed off Medicare Important Message given?   (If response is "NO", the following Medicare IM given date fields will be blank) Date Medicare IM given:   Medicare IM given by:   Date Additional Medicare IM given:   Additional Medicare IM given by:    Discharge Disposition:  HOME/SELF CARE  Per UR Regulation:    If discussed at Long Length of Stay Meetings, dates discussed:    Comments:  Brizeyda Holtmeyer RN, BSN, MSHL, CCM  Nurse - Case Manager, (Unit [phone])  [phone]  04/20/2014 Med Review:  Plavix

## 2014-04-20 NOTE — H&P (View-Only) (Signed)
Patient ID: Bobby Munoz, male   DOB: 18-Jan-1956, 58 y.o.   MRN: [id number]    [address] [place], [place]  [address] Phone: [phone] Fax:  [phone]  Date:  04/13/2014   ID:  Bobby Munoz [birth year], MRN [id number]  PCP:  Neldon Labella, MD      History of Present Illness: Bobby Munoz is a 58 y.o. male had multivessel coronary disease diagnosed in June of 2015. He had decided to have PCI rather than any type of surgical evaluation. We stented his LAD. He continues to have some lower chest tightness with walking upstairs. He has frequent palpitations. He wore a monitor showing PVCs. He has not used nitroglycerin at home. Symptoms have not been relieved with beta-blockade as well as isosorbide. He would like to have the second vessel addressed percutaneously.   Wt Readings from Last 3 Encounters:  04/13/14 177 lb 3.2 oz (80.377 kg)  03/21/14 180 lb (81.647 kg)  03/10/14 182 lb 6.4 oz (82.736 kg)     Past Medical History  Diagnosis Date  . Hypertension   . LBBB (left bundle branch block) 2010  . Other and unspecified angina pectoris   . Coronary artery disease   . High cholesterol   . Pneumonia     "maybe once"  . GERD (gastroesophageal reflux disease)   . Arthritis     "left shoulder" (03/09/2014)  . Seasonal allergies     "fall and spring"  . Allergic to cats     Current Outpatient Prescriptions  Medication Sig Dispense Refill  . aspirin EC 81 MG tablet Take 81 mg by mouth daily.      Marland Kitchen atorvastatin (LIPITOR) 40 MG tablet Take 1 tablet (40 mg total) by mouth daily at 6 PM.  30 tablet  5  . CINNAMON PO Take 1 tablet by mouth daily.      . clopidogrel (PLAVIX) 75 MG tablet Take 1 tablet (75 mg total) by mouth daily with breakfast.  30 tablet  11  . Coenzyme Q10 (COQ10 PO) Take 1 capsule by mouth daily.      . Cyanocobalamin (B-12 PO) Take 1 tablet by mouth daily.      . isosorbide mononitrate (IMDUR) 30 MG 24 hr tablet Take 1 tablet (30 mg  total) by mouth daily.  30 tablet  6  . losartan (COZAAR) 50 MG tablet 1 tablet twice a day, but if systolic is less than 120 hold second dose  30 tablet  5  . metoprolol tartrate (LOPRESSOR) 25 MG tablet Take 1 tablet (25 mg total) by mouth 2 (two) times daily.  60 tablet  5  . nitroGLYCERIN (NITROSTAT) 0.4 MG SL tablet Place 1 tablet (0.4 mg total) under the tongue every 5 (five) minutes as needed for chest pain.  25 tablet  3  . Omega-3 Fatty Acids (FISH OIL) 1000 MG CPDR Take 2,000 mg by mouth daily.      . pantoprazole (PROTONIX) 40 MG tablet Take 1 tablet (40 mg total) by mouth daily.  30 tablet  5   No current facility-administered medications for this visit.    Allergies:    Allergies  Allergen Reactions  . Latex Itching, Swelling and Rash    Social History:  The patient  reports that he has never smoked. He has never used smokeless tobacco. He reports that he drinks about 1.2 ounces of alcohol per week. He reports that he does not use illicit  drugs.   Family History:  The patient's family history includes Heart attack in his father; Heart failure in his father.   ROS:  Please see the history of present illness.  No nausea, vomiting.  No fevers, chills.  No focal weakness.  No dysuria. All other systems reviewed and negative.   PHYSICAL EXAM: VS:  BP 142/90  Pulse 67  Ht 5\' 6"  (1.676 m)  Wt 177 lb 3.2 oz (80.377 kg)  BMI 28.61 kg/m2 Well nourished, well developed, in no acute distress HEENT: normal Neck: no JVD, no carotid bruits Cardiac:  normal S1, S2; RRR; PVCs Lungs:  clear to auscultation bilaterally, no wheezing, rhonchi or rales Abd: soft, nontender, no hepatomegaly Ext: no edema Skin: warm and dry Neuro:   no focal abnormalities noted      ASSESSMENT AND PLAN:  1. CAD: Still having symptoms of angina. We discussed the risks and benefits of CTO PCI. He is willing to proceed. His anginal symptoms have improved but are not gone. He has had some low blood  pressures as well which makes a difficult to increase antianginal medications.  We discussed risks of perforation, renal dysfunction, stroke, MI among others. He understands and is willing to proceed. 25 minutes spent with the patient and his wife. 2. PVCs: He continues to have some PVCs. He does feel symptoms with that. No lightheadedness. He thinks some of this is from stress. Beta-blockade is limited by his blood pressure. 3. 3. HTN: BP variable. COntinue antihypertensive meds.  Signed, Fredric MareJay S. Aloysious Vangieson, MD, [facility] 04/13/2014 3:03 PM

## 2014-04-20 NOTE — Progress Notes (Signed)
Pt ambulated post bedrest 200 ft with standby assistance only.  No complaints of pain, bilateral groins level 0, with angioseal dressings dry and intact.

## 2014-04-20 NOTE — CV Procedure (Signed)
PROCEDURE:  Left heart catheterization; PCI of chronic total occlusion of the right coronary artery.  INDICATIONS:  Chronic total occlusion of the RCA with refractory angina.  The risks, benefits, and details of the procedure were explained to the patient.  The patient verbalized understanding and wanted to proceed.  Informed written consent was obtained.  PROCEDURE TECHNIQUE:  After Xylocaine anesthesia an 76F sheath was placed in the right femoral artery with a single anterior needle wall stick.    In a similar fashion, a 6 French sheath was placed in the left femoral artery.Left coronary angiography was done using a CLS 4 guide catheter.  Right coronary angiography was done using a Judkins R4 guide catheter.  Left heart catheterization was done using the JR 4. An 8 French Angio-Seal was deployed in the right groin. A 6 French Angio-Seal was deployed in the left groin for hemostasis.   CONTRAST:  Total of 160 cc.  COMPLICATIONS:  None.    HEMODYNAMICS:  Aortic pressure was 117/64; LV pressure was 117/10; LVEDP 18.  There was no gradient between the left ventricle and aorta.    ANGIOGRAPHIC DATA:   The right coronary artery is occluded, just after the origin of a large RV marginal branch. The RV marginal branch provides an epicardial collateral to the distal RCA system. There are septal perforators providing we collaterals to the RCA system.  PCI NARRATIVE: IV heparin was used for anticoagulation. Multiple A plan discharge in a.m. if no complications. CTs were obtained to check that the anticoagulation was therapeutic. A JR 4 guiding catheter, 8 [place], was using his the RCA. A 6 French CLS 4 catheter was used for the left main. A corsair catheter was loaded with a Fielder XT wire. This was advanced to the beginning of the occlusion in the RCA. The [facility] Wire would not cross the lesion. We tried to knock: [employer] and although it would not go, it would not advance. We then switched to a  pilot 200 wire through the corsair. This wire had a much bigger knuckle and would not advance. As we moved the corsair catheter, the pilot 200 did advance in the subintimal space most likely with a straight tip. We then changed out the corsair for a cross boss, using a trap balloon.  The cross boss would not advance easily. A 7 [place] guideliner was placed through the 8 [place] guide. The cross boss was advanced to where the vessel reconstituted from collaterals. A pro-water wire was advanced through the cross boss about did not go easily and thus we thought we were subintimal. Angiographically, it was difficult to tell if we were in the lumen or in the subintimal space. The pilot 200 wire was switched out for a [employer] 6 g wire. This was advanced to the tip of the cross boss. The cross boss was removed. A stingray balloon was advanced. A stingray wire was then used to obtain access to the true lumen. The stingray wire did advance a little bit. We attempted to remove the stingray balloon, but unfortunately in this process, wire access was lost. We then tried to advance the stingray balloon back. The stingray wire continued to migrate backwards. We used the stingray balloon to maintain position past the RV marginal branch. The miracle 6 g wire was then advanced. This wire then found the true lumen. The miracle 6 g wire was changed out for a 300 cm pro-water wire. At this point a pro-water wire  was moving freely in the vessel and it appeared that the pro-water was in true lumen based on retrograde angiography from the left.  A 2.0 balloon and 2.5 balloon were used to predilate the entire length of the lesion. A 2.5 x 38 promise drug-eluting stent was then advanced to the distal area and deployed. A 3.0 x 38 promus drug-eluting stent was then deployed in overlapping fashion.  There is difficulty in withdrawing the stent balloon back. Unfortunately, the guide, guideliner and wire position were lost. At this  point, the hemodynamic assessment of LV pressure with pullback were performed using the JR 4 guide catheter. Please see above for the hemodynamics.   The JR 4 guide was placed again. A pro-water wire was easily placed into the distal RCA system. A 3.5 x 38 stent was then deployed overlapping the proximal edge of the stented segment. A 3.5 x 16 stent was placed more proximally overlapping the stented segment.  The entire stented segment was postdilated with a 3.5 noncompliant balloon. There was an excellent angiographic result with no residual stenosis.   IMPRESSIONS:  1.  Successful PCI of a chronic total occlusion in the mid right coronary artery.  There were 4 overlapping stents placed as noted above; all of the stents were drug-eluting.   2. LVEDP 18 mmHg.    RECOMMENDATION:  Continue dual antiplatelet therapy indefinitely. He'll be watched overnight.  Stop Imdur.

## 2014-04-21 ENCOUNTER — Encounter (HOSPITAL_COMMUNITY): Payer: Self-pay | Admitting: Physician Assistant

## 2014-04-21 DIAGNOSIS — I2510 Atherosclerotic heart disease of native coronary artery without angina pectoris: Secondary | ICD-10-CM | POA: Diagnosis not present

## 2014-04-21 LAB — BASIC METABOLIC PANEL
Anion gap: 11 (ref 5–15)
BUN: 13 mg/dL (ref 6–23)
CALCIUM: 8.4 mg/dL (ref 8.4–10.5)
CO2: 25 mEq/L (ref 19–32)
Chloride: 110 mEq/L (ref 96–112)
Creatinine, Ser: 0.83 mg/dL (ref 0.50–1.35)
GFR calc Af Amer: >90 mL/min (ref >90)
GFR calc non Af Amer: >90 mL/min (ref >90)
GLUCOSE: 93 mg/dL (ref 70–99)
Potassium: 4.1 mEq/L (ref 3.7–5.3)
SODIUM: 146 meq/L (ref 137–147)

## 2014-04-21 LAB — CBC
HEMATOCRIT: 37.6 % — AB (ref 39.0–52.0)
Hemoglobin: 12.5 g/dL — ABNORMAL LOW (ref 13.0–17.0)
MCH: 30.6 pg (ref 26.0–34.0)
MCHC: 33.2 g/dL (ref 30.0–36.0)
MCV: 91.9 fL (ref 78.0–100.0)
Platelets: 173 10*3/uL (ref 150–400)
RBC: 4.09 MIL/uL — ABNORMAL LOW (ref 4.22–5.81)
RDW: 12.5 % (ref 11.5–15.5)
WBC: 8.5 10*3/uL (ref 4.0–10.5)

## 2014-04-21 NOTE — Progress Notes (Signed)
CARDIAC REHAB PHASE I   PRE:  Rate/Rhythm: 61 LBBB/bigeminy    BP: sitting 164/59    SaO2:   MODE:  Ambulation: 1000 ft   POST:  Rate/Rhythm: 120 LBBB/bigeminy    BP: sitting 174/84     SaO2:   Steady but sore in groins.  With distance HR up to 120 LBBB/bigeminy, pt notes "feeling his HR" and SOB. Pt denies angina. Pt just recently took his beta blocker. Also admits to getting anxious at times when he is feeling sx. Encouraged pt to continue to communicate with his cardiologist. Reviewed diet, NTG, Plavix, CRPII. Plans to begin [place] CRPII next week. [phone]  [name], Bobby Munoz CES, ACSM 04/21/2014 9:38 AM

## 2014-04-21 NOTE — Discharge Summary (Signed)
Physician Discharge Summary    Cardiologist:  Eldridge Dace  Patient ID: JA OHMAN MRN: [id number] DOB/AGE: 1956-06-15 58 y.o.  Admit date: 04/20/2014 Discharge date: 04/21/2014  Admission Diagnoses: Atherosclerosis of native coronary artery of native heart with angina pectoris  Discharge Diagnoses:  Principal Problem:   Atherosclerosis of native coronary artery of native heart with angina pectoris Active Problems:   HTN (hypertension)   LBBB (left bundle branch block)   Mixed hyperlipidemia   Other and unspecified angina pectoris   Discharged Condition: stable  Hospital Course:   Bobby Munoz is a 58 y.o. male had multivessel coronary disease diagnosed in June of 2015. He had decided to have PCI rather than any type of surgical evaluation. We stented his LAD. He continues to have some lower chest tightness with walking upstairs. He has frequent palpitations. He wore a monitor showing PVCs. He has not used nitroglycerin at home. Symptoms have not been relieved with beta-blockade as well as isosorbide. He would like to have the second vessel addressed percutaneously.  He was scheduled for, and underwent a LHC with successful PCI of a chronic total occlusion in the mid right coronary artery. There were 4 overlapping stents placed as noted above; all of the stents were drug-eluting.  Imdur was discontinued.  ASA and plavix indefinitely.  The patient was seen by Dr. Eldridge Dace who felt he was stable for DC home.    Consults: None  Significant Diagnostic Studies:   PROCEDURE: Left heart catheterization; PCI of chronic total occlusion of the right coronary artery.   INDICATIONS: Chronic total occlusion of the RCA with refractory angina.  The risks, benefits, and details of the procedure were explained to the patient. The patient verbalized understanding and wanted to proceed. Informed written consent was obtained.  PROCEDURE TECHNIQUE: After Xylocaine anesthesia an 50F sheath was placed in  the right femoral artery with a single anterior needle wall stick. In a similar fashion, a 6 French sheath was placed in the left femoral artery.Left coronary angiography was done using a CLS 4 guide catheter. Right coronary angiography was done using a Judkins R4 guide catheter. Left heart catheterization was done using the JR 4. An 8 French Angio-Seal was deployed in the right groin. A 6 French Angio-Seal was deployed in the left groin for hemostasis.  CONTRAST: Total of 160 cc.  COMPLICATIONS: None.  HEMODYNAMICS: Aortic pressure was 117/64; LV pressure was 117/10; LVEDP 18. There was no gradient between the left ventricle and aorta.  ANGIOGRAPHIC DATA: The right coronary artery is occluded, just after the origin of a large RV marginal branch. The RV marginal branch provides an epicardial collateral to the distal RCA system. There are septal perforators providing we collaterals to the RCA system.  PCI NARRATIVE: IV heparin was used for anticoagulation. Multiple A plan discharge in a.m. if no complications. CTs were obtained to check that the anticoagulation was therapeutic. A JR 4 guiding catheter, 8 [place], was using his the RCA. A 6 French CLS 4 catheter was used for the left main. A corsair catheter was loaded with a Fielder XT wire. This was advanced to the beginning of the occlusion in the RCA. The [facility] Wire would not cross the lesion. We tried to knock: [employer] and although it would not go, it would not advance. We then switched to a pilot 200 wire through the corsair. This wire had a much bigger knuckle and would not advance. As we moved the corsair catheter, the pilot 200  did advance in the subintimal space most likely with a straight tip. We then changed out the corsair for a cross boss, using a trap balloon. The cross boss would not advance easily. A 7 [place] guideliner was placed through the 8 [place] guide. The cross boss was advanced to where the vessel reconstituted from collaterals. A  pro-water wire was advanced through the cross boss about did not go easily and thus we thought we were subintimal. Angiographically, it was difficult to tell if we were in the lumen or in the subintimal space. The pilot 200 wire was switched out for a [employer] 6 g wire. This was advanced to the tip of the cross boss. The cross boss was removed. A stingray balloon was advanced. A stingray wire was then used to obtain access to the true lumen. The stingray wire did advance a little bit. We attempted to remove the stingray balloon, but unfortunately in this process, wire access was lost. We then tried to advance the stingray balloon back. The stingray wire continued to migrate backwards. We used the stingray balloon to maintain position past the RV marginal branch. The miracle 6 g wire was then advanced. This wire then found the true lumen. The miracle 6 g wire was changed out for a 300 cm pro-water wire. At this point a pro-water wire was moving freely in the vessel and it appeared that the pro-water was in true lumen based on retrograde angiography from the left. A 2.0 balloon and 2.5 balloon were used to predilate the entire length of the lesion. A 2.5 x 38 promise drug-eluting stent was then advanced to the distal area and deployed. A 3.0 x 38 promus drug-eluting stent was then deployed in overlapping fashion. There is difficulty in withdrawing the stent balloon back. Unfortunately, the guide, guideliner and wire position were lost. At this point, the hemodynamic assessment of LV pressure with pullback were performed using the JR 4 guide catheter. Please see above for the hemodynamics.  The JR 4 guide was placed again. A pro-water wire was easily placed into the distal RCA system. A 3.5 x 38 stent was then deployed overlapping the proximal edge of the stented segment. A 3.5 x 16 stent was placed more proximally overlapping the stented segment. The entire stented segment was postdilated with a 3.5  noncompliant balloon. There was an excellent angiographic result with no residual stenosis.  IMPRESSIONS:  1. Successful PCI of a chronic total occlusion in the mid right coronary artery. There were 4 overlapping stents placed as noted above; all of the stents were drug-eluting.  2. LVEDP 18 mmHg.  RECOMMENDATION: Continue dual antiplatelet therapy indefinitely. He'll be watched overnight. Stop Imdur.     Treatments: See above  Discharge Exam: Blood pressure 142/81, pulse 53, temperature 97.2 F (36.2 C), temperature source Oral, resp. rate 20, height 5\' 6"  (1.676 m), weight 180 lb 8.9 oz (81.9 kg), SpO2 99.00%.  Disposition: 01-Home or Self Care      Discharge Instructions   Diet - low sodium heart healthy    Complete by:  As directed      Discharge instructions    Complete by:  As directed   No lifting more than a half gallon of milk or driving for three days.     Increase activity slowly    Complete by:  As directed             Medication List    STOP taking these medications  isosorbide mononitrate 30 MG 24 hr tablet  Commonly known as:  IMDUR      TAKE these medications       aspirin EC 81 MG tablet  Take 81 mg by mouth daily.     atorvastatin 40 MG tablet  Commonly known as:  LIPITOR  Take 1 tablet (40 mg total) by mouth daily at 6 PM.     B-12 PO  Take 1 tablet by mouth daily.     CINNAMON PO  Take 1 tablet by mouth daily.     clopidogrel 75 MG tablet  Commonly known as:  PLAVIX  Take 1 tablet (75 mg total) by mouth daily with breakfast.     COQ10 PO  Take 1 capsule by mouth daily.     Fish Oil 1000 MG Cpdr  Take 1,000 mg by mouth 2 (two) times daily.     losartan 50 MG tablet  Commonly known as:  COZAAR  Take 50 mg by mouth See admin instructions. 1 tablet twice a day, but if systolic is less than 120 hold second dose     metoprolol tartrate 25 MG tablet  Commonly known as:  LOPRESSOR  Take 1 tablet (25 mg total) by mouth 2 (two) times  daily.     nitroGLYCERIN 0.4 MG SL tablet  Commonly known as:  NITROSTAT  Place 1 tablet (0.4 mg total) under the tongue every 5 (five) minutes as needed for chest pain.     pantoprazole 40 MG tablet  Commonly known as:  PROTONIX  Take 1 tablet (40 mg total) by mouth daily.       Follow-up Information   Follow up with Tereso NewcomerScott Weaver, PA-C On 05/13/2014. (11:30)    Specialty:  Physician Assistant   Contact information:   [address] [place] [address] [phone]      Greater than 30 minutes was spent completing the patient's discharge.     SignedWilburt Finlay: HAGER, BRYAN, PAC 04/21/2014, 8:15 AM   I have examined the patient and reviewed assessment and plan and discussed with patient.  Agree with above as stated.  Continue dual antiplatelet therapy indefinitely given long areas of stent in the LAD and RCA.  Stopped Imdur.  Continue metoprolol given prior palpitations.  Ashna Dorough S.

## 2014-04-21 NOTE — Progress Notes (Signed)
Subjective: No CP or SOB.  Left groin is a little sore.  Objective: Vital signs in last 24 hours: Temp:  [97.6 F (36.4 C)-98.5 F (36.9 C)] 98.4 F (36.9 C) (08/06 0020) Pulse Rate:  [45-77] 68 (08/06 0020) Resp:  [18] 18 (08/06 0020) BP: (100-158)/(51-95) 100/51 mmHg (08/06 0020) SpO2:  [96 %-100 %] 98 % (08/06 0020) Weight:  [180 lb 8.9 oz (81.9 kg)] 180 lb 8.9 oz (81.9 kg) (08/06 0020)    Intake/Output from previous day: 08/05 0701 - 08/06 0700 In: 1003.3 [P.O.:480; I.V.:523.3] Out: 1825 [Urine:1825] Intake/Output this shift: Total I/O In: 523.3 [I.V.:523.3] Out: 525 [Urine:525]  Medications Current Facility-Administered Medications  Medication Dose Route Frequency Provider Last Rate Last Dose  . acetaminophen (TYLENOL) tablet 650 mg  650 mg Oral Q4H PRN Corky Crafts, MD      . aspirin chewable tablet 81 mg  81 mg Oral Daily Corky Crafts, MD      . aspirin EC tablet 81 mg  81 mg Oral Daily Corky Crafts, MD      . atorvastatin (LIPITOR) tablet 40 mg  40 mg Oral q1800 Corky Crafts, MD   40 mg at 04/20/14 1830  . clopidogrel (PLAVIX) tablet 75 mg  75 mg Oral Q breakfast Corky Crafts, MD      . clopidogrel (PLAVIX) tablet 75 mg  75 mg Oral Q breakfast Corky Crafts, MD      . losartan (COZAAR) tablet 50 mg  50 mg Oral See admin instructions Corky Crafts, MD      . metoprolol tartrate (LOPRESSOR) tablet 25 mg  25 mg Oral BID Corky Crafts, MD   25 mg at 04/20/14 2205  . nitroGLYCERIN (NITROSTAT) SL tablet 0.4 mg  0.4 mg Sublingual Q5 min PRN Corky Crafts, MD      . ondansetron [facility]) injection 4 mg  4 mg Intravenous Q6H PRN Corky Crafts, MD      . pantoprazole (PROTONIX) EC tablet 40 mg  40 mg Oral Daily Corky Crafts, MD   40 mg at 04/20/14 1344    PE: General appearance: alert, cooperative and no distress Lungs: clear to auscultation bilaterally Heart: regular rate and rhythm, S1, S2 normal,  no murmur, click, rub or gallop Extremities: No LEE Pulses: 2+ and symmetric Skin: Warmand dry.  Right groin:  no ecchymosis or hematoma.  LEft groin has moderate area of ecchymosis soft soft hematoma and no bruit. Mildly tender Neurologic: Grossly normal  Lab Results:   Recent Labs  04/21/14 0407  WBC 8.5  HGB 12.5*  HCT 37.6*  PLT 173   BMET  Recent Labs  04/21/14 0407  NA 146  K 4.1  CL 110  CO2 25  GLUCOSE 93  BUN 13  CREATININE 0.83  CALCIUM 8.4      Assessment/Plan   Principal Problem:   Atherosclerosis of native coronary artery of native heart with angina pectoris Active Problems:   HTN (hypertension)   LBBB (left bundle branch block)   Mixed hyperlipidemia   Other and unspecified angina pectoris  Plan:    SP successful PCI of a chronic total occlusion in the mid right coronary artery. There were 4 overlapping stents placed as noted above; all of the stents were drug-eluting. LVEDP 18 mmHg.   ASA, plavix, lipitor, cozaar 50, lopressor 25 bid  Labs stable  Ambulate this morning and DC home.    LOS: 1 day  Wilburt FinlayHAGER, BRYAN PA-C 04/21/2014 6:32 AM  I have examined the patient and reviewed assessment and plan and discussed with patient.  Agree with above as stated.  Indefinite DAPT.  Burlene Montecalvo S.

## 2014-04-25 NOTE — Telephone Encounter (Signed)
New problem    Pt need note for work stating he was out 04/20/12 thur 04/22/14 due to medical reason. Please call pt or fax to [phone]

## 2014-04-25 NOTE — Telephone Encounter (Addendum)
Dr. Eldridge DaceVaranasi, is it okay for me to create letter stating below for patient?

## 2014-04-26 ENCOUNTER — Encounter: Payer: Self-pay | Admitting: Cardiology

## 2014-04-26 NOTE — Telephone Encounter (Signed)
lmtrc

## 2014-04-26 NOTE — Telephone Encounter (Signed)
Ok to create letter.

## 2014-04-28 NOTE — Telephone Encounter (Signed)
Pt notified letter will be faxed today.

## 2014-05-13 ENCOUNTER — Ambulatory Visit (INDEPENDENT_AMBULATORY_CARE_PROVIDER_SITE_OTHER): Payer: Managed Care, Other (non HMO) | Admitting: Physician Assistant

## 2014-05-13 ENCOUNTER — Encounter: Payer: Self-pay | Admitting: Physician Assistant

## 2014-05-13 VITALS — BP 140/88 | HR 50 | Ht 66.0 in | Wt 176.0 lb

## 2014-05-13 DIAGNOSIS — E782 Mixed hyperlipidemia: Secondary | ICD-10-CM

## 2014-05-13 DIAGNOSIS — I2510 Atherosclerotic heart disease of native coronary artery without angina pectoris: Secondary | ICD-10-CM

## 2014-05-13 DIAGNOSIS — I2584 Coronary atherosclerosis due to calcified coronary lesion: Secondary | ICD-10-CM

## 2014-05-13 DIAGNOSIS — I10 Essential (primary) hypertension: Secondary | ICD-10-CM

## 2014-05-13 DIAGNOSIS — Z0000 Encounter for general adult medical examination without abnormal findings: Secondary | ICD-10-CM

## 2014-05-13 LAB — BASIC METABOLIC PANEL
BUN: 14 mg/dL (ref 6–23)
CALCIUM: 9 mg/dL (ref 8.4–10.5)
CO2: 29 mEq/L (ref 19–32)
Chloride: 104 mEq/L (ref 96–112)
Creatinine, Ser: 0.9 mg/dL (ref 0.4–1.5)
GFR: 90.73 mL/min (ref >60.00)
GLUCOSE: 78 mg/dL (ref 70–99)
Potassium: 4.2 mEq/L (ref 3.5–5.1)
Sodium: 139 mEq/L (ref 135–145)

## 2014-05-13 LAB — TSH: TSH: 1.49 u[IU]/mL (ref 0.35–4.50)

## 2014-05-13 LAB — PSA: PSA: 1.68 ng/mL (ref 0.10–4.00)

## 2014-05-13 NOTE — Patient Instructions (Signed)
Your physician recommends that you continue on your current medications as directed. Please refer to the Current Medication list given to you today.  Your physician recommends that you return for lab work in: TODAY; BMET, TSH  WE HAVE DRAWN LAB WORK FOR DR. LISA MILLER TODAY ; PSA  Your physician recommends that you schedule a follow-up appointment in: 06/30/14 1:30 WITH DR. VARANASI

## 2014-05-13 NOTE — Progress Notes (Signed)
Cardiology Office Note    Date:  05/13/2014   ID:  [name], Tabet [birth year], MRN [id number]  PCP:  Bobby Labella, MD  Cardiologist:  Dr. Everette Rank      History of Present Illness: Bobby Munoz is a 58 y.o. male with a hx of 2 vessel CAD s/p PCI of the mid LAD with a DES in 02/2014 and known occlusion of the RCA.  He was seen in FU by Dr. Everette Rank in 03/2014.  He was still having symptoms of angina.  He was set up for CTO PCI of the RCA.  He was admitted 8/5-8/6 and the procedure was successful with a total of 4 overlapping DES.  Indefinite dual antiplatelet Rx was recommended.  He returns for FU.  He is doing much better.  Denies further angina.  Denies significant DOE.  Denies orthopnea, PND, edema.  Denies syncope.   He continues to have significant palpitations.  Holter in 7/15 demonstrated NSR, PVCs (8%).  He denies significant caffeine intake.  He does note poor sleep and increased anxiety.     Studies:  - LHC (6/15):  Mid LAD 80%, RCA occluded, EF 50%, LVEDP 14 mmHg >>> PCI:  overlapping DESs of 2.5 x 38 and 3.0 x 38 mm (Xience) to mid LAD.  Attempted PCI of RCA unsuccessful.    - Successful PCI of CTO of mid RCA with 4 overlapping DES (04/20/14)  - Nuclear (6/15):  Inf scar with peri-infarct ischemia, EF 39%; INTERMEDIATE RISK   Recent Labs/Images: 02/01/2014: ALT 27  02/24/2014: HDL Cholesterol by NMR 40.70; LDL (calc) 143*  04/21/2014: Creatinine 0.83; Hemoglobin 12.5*; Potassium 4.1     Wt Readings from Last 3 Encounters:  05/13/14 176 lb (79.833 kg)  04/21/14 180 lb 8.9 oz (81.9 kg)  04/21/14 180 lb 8.9 oz (81.9 kg)     Past Medical History  Diagnosis Date  . Hypertension   . LBBB (left bundle branch block) 2010  . Other and unspecified angina pectoris   . Coronary artery disease   . High cholesterol   . GERD (gastroesophageal reflux disease)   . Seasonal allergies     "fall and spring"  . Allergic to cats   . Pneumonia X 1  . Arthritis     "left  shoulder" (04/20/2014)    Current Outpatient Prescriptions  Medication Sig Dispense Refill  . aspirin EC 81 MG tablet Take 81 mg by mouth daily.      Marland Kitchen atorvastatin (LIPITOR) 40 MG tablet Take 1 tablet (40 mg total) by mouth daily at 6 PM.  30 tablet  5  . CINNAMON PO Take 1 tablet by mouth daily.      . citalopram (CELEXA) 20 MG tablet Take 20 mg by mouth daily.       . clopidogrel (PLAVIX) 75 MG tablet Take 1 tablet (75 mg total) by mouth daily with breakfast.  30 tablet  11  . Coenzyme Q10 (COQ10 PO) Take 1 capsule by mouth daily.      . Cyanocobalamin (B-12 PO) Take 1 tablet by mouth daily.      Marland Kitchen losartan (COZAAR) 50 MG tablet Take 50 mg by mouth See admin instructions. 1 tablet twice a day, but if systolic is less than 120 hold second dose      . metoprolol tartrate (LOPRESSOR) 25 MG tablet Take 1 tablet (25 mg total) by mouth 2 (two) times daily.  60 tablet  5  . nitroGLYCERIN (NITROSTAT)  0.4 MG SL tablet Place 1 tablet (0.4 mg total) under the tongue every 5 (five) minutes as needed for chest pain.  25 tablet  3  . Omega-3 Fatty Acids (FISH OIL) 1000 MG CPDR Take 1,000 mg by mouth 2 (two) times daily.       . pantoprazole (PROTONIX) 40 MG tablet Take 1 tablet (40 mg total) by mouth daily.  30 tablet  5   No current facility-administered medications for this visit.     Allergies:   Latex   Social History:  The patient  reports that he has never smoked. He has never used smokeless tobacco. He reports that he drinks alcohol. He reports that he does not use illicit drugs.   Family History:  The patient's family history includes Heart attack in his father; Heart failure in his father.   ROS:  Please see the history of present illness.      All other systems reviewed and negative.   PHYSICAL EXAM: VS:  BP 140/88  Pulse 50  Ht  (1.676 m)  Wt 176 lb (79.833 kg)  BMI 28.42 kg/m2 Well nourished, well developed, in no acute distress HEENT: normal Neck: no JVD Cardiac:  normal  S1, S2; RRR; no murmur Lungs:  clear to auscultation bilaterally, no wheezing, rhonchi or rales Abd: soft, nontender, no hepatomegaly Ext: no edemabilateral groins without hematoma or bruit  Skin: warm and dry Neuro:  CNs 2-12 intact, no focal abnormalities noted  EKG:  NSR, HR 50, LBBB      ASSESSMENT AND PLAN:  Coronary artery disease:  Doing well after CTO PCI of RCA.  He denies further angina. Continue current Rx with ASA, Plavix, statin, beta blocker.  He will start cardiac rehab soon.    Mixed hyperlipidemia:  Continue statin.   Essential hypertension:  Fair control.  He notes his BP goes up in the MD office.  He monitors closely at home and BPs have been optimal.    PVCs:  He had 8% PVCs on Holter last month.  EF has been low normal.  He has problems with anxiety and poor sleep. I have asked him to FU with his PCP for this.  His HR is too slow to increase his beta blocker.  I have asked him to eliminate all caffeine.  Will check BMET and TSH today.  Disposition:  FU with Dr. Everette Rank in 6 weeks.  He asked to get PSA done today and have it forwarded to his PCP (annual FU).    Signed, Brynda Rim, MHS 05/13/2014 12:23 PM    [facility] [address] [place], [place], [place]  [address] Phone: [phone]; Fax: [phone]

## 2014-05-16 ENCOUNTER — Telehealth: Payer: Self-pay | Admitting: *Deleted

## 2014-05-16 NOTE — Telephone Encounter (Signed)
pt notified about lab results with verbal understanding  

## 2014-06-09 ENCOUNTER — Telehealth: Payer: Self-pay | Admitting: Interventional Cardiology

## 2014-06-09 NOTE — Telephone Encounter (Signed)
lmtrc

## 2014-06-09 NOTE — Telephone Encounter (Addendum)
Spoke with pts wife and pts Bp last night was 160/102. Bp this am was about the same. Pt was taking losartan 50 mg bid until about 1 week ago. Pt felt well so he decided to just take the 50 mg every evening (skip evening dose) Pt recently started to monitor BP when he started to feel fatigued and have intermittent palpitations. A few days ago pt started to check BP since he wasn't feeling well and BP has been running 160-150's/100-90's. Pt added the evening 50 mg of losartan back to his regimen and BP is still running 160-140's/90-80's, which is a slight improvement. Pt took two 50 mg tablets of losartan this am 3 hours apart because of elevated BP. Currently BP is 148/95. Pt will hold evening dose since he has already taken 50 mg X 2 today. Pt will continue to monitor BP and take losartan 50 mg bid until further recommendation from Dr. Eldridge Dace.

## 2014-06-09 NOTE — Telephone Encounter (Signed)
New problem    Pt's bp is still elevated and need to speak to nurse. Please call

## 2014-06-11 NOTE — Telephone Encounter (Signed)
Add amlodipine 5 mg daily

## 2014-06-12 ENCOUNTER — Encounter: Payer: Self-pay | Admitting: *Deleted

## 2014-06-14 MED ORDER — LOSARTAN POTASSIUM 100 MG PO TABS: ORAL_TABLET | ORAL | Status: DC

## 2014-06-14 MED ORDER — AMLODIPINE BESYLATE 5 MG PO TABS: 5.0000 mg | ORAL_TABLET | Freq: Every day | ORAL | Status: DC

## 2014-06-14 NOTE — Telephone Encounter (Signed)
Pt notified Rx sent in.

## 2014-06-14 NOTE — Telephone Encounter (Signed)
lmtrc

## 2014-06-28 ENCOUNTER — Telehealth: Payer: Self-pay | Admitting: Physician Assistant

## 2014-06-28 MED ORDER — LOSARTAN POTASSIUM 100 MG PO TABS: ORAL_TABLET | ORAL | Status: DC

## 2014-06-28 NOTE — Telephone Encounter (Signed)
The patient's wife called because he is out of his ARB.  Refill sent.  Wilburt FinlayHAGER, Gilberta Peeters PAC

## 2014-06-29 ENCOUNTER — Other Ambulatory Visit (INDEPENDENT_AMBULATORY_CARE_PROVIDER_SITE_OTHER): Payer: Managed Care, Other (non HMO) | Admitting: *Deleted

## 2014-06-29 DIAGNOSIS — E782 Mixed hyperlipidemia: Secondary | ICD-10-CM

## 2014-06-29 DIAGNOSIS — Z79899 Other long term (current) drug therapy: Secondary | ICD-10-CM

## 2014-06-29 LAB — HEPATIC FUNCTION PANEL
ALK PHOS: 45 U/L (ref 39–117)
ALT: 38 U/L (ref 0–53)
AST: 32 U/L (ref 0–37)
Albumin: 3.7 g/dL (ref 3.5–5.2)
BILIRUBIN DIRECT: 0.1 mg/dL (ref 0.0–0.3)
BILIRUBIN TOTAL: 1.1 mg/dL (ref 0.2–1.2)
Total Protein: 7.5 g/dL (ref 6.0–8.3)

## 2014-06-29 LAB — LIPID PANEL
CHOL/HDL RATIO: 4
Cholesterol: 163 mg/dL (ref 0–200)
HDL: 44.3 mg/dL (ref >39.00)
LDL CALC: 106 mg/dL — AB (ref 0–99)
NonHDL: 118.7
Triglycerides: 63 mg/dL (ref 0.0–149.0)
VLDL: 12.6 mg/dL (ref 0.0–40.0)

## 2014-06-30 ENCOUNTER — Other Ambulatory Visit: Payer: Managed Care, Other (non HMO)

## 2014-06-30 ENCOUNTER — Ambulatory Visit (INDEPENDENT_AMBULATORY_CARE_PROVIDER_SITE_OTHER): Payer: Managed Care, Other (non HMO) | Admitting: Interventional Cardiology

## 2014-06-30 ENCOUNTER — Encounter: Payer: Self-pay | Admitting: Interventional Cardiology

## 2014-06-30 VITALS — BP 136/90 | HR 54 | Ht 67.0 in | Wt 178.0 lb

## 2014-06-30 DIAGNOSIS — I2510 Atherosclerotic heart disease of native coronary artery without angina pectoris: Secondary | ICD-10-CM

## 2014-06-30 DIAGNOSIS — R002 Palpitations: Secondary | ICD-10-CM

## 2014-06-30 DIAGNOSIS — I493 Ventricular premature depolarization: Secondary | ICD-10-CM

## 2014-06-30 DIAGNOSIS — E782 Mixed hyperlipidemia: Secondary | ICD-10-CM

## 2014-06-30 DIAGNOSIS — I10 Essential (primary) hypertension: Secondary | ICD-10-CM

## 2014-06-30 MED ORDER — METOPROLOL TARTRATE 25 MG PO TABS: ORAL_TABLET | ORAL | Status: DC

## 2014-06-30 NOTE — Addendum Note (Signed)
Addended by: Sherri RadMCGHEE, HEATHER C on: 06/30/2014 02:57 PM   Modules accepted: Orders

## 2014-06-30 NOTE — Patient Instructions (Signed)
Your physician has recommended you make the following change in your medication:  1) you may take an additional metoprolol 25 mg tablet as needed for palpitions  Your physician wants you to follow-up in: 6 months with Dr. Eldridge DaceVaranasi. You will receive a reminder letter in the mail two months in advance. If you don't receive a letter, please call our office to schedule the follow-up appointment.

## 2014-06-30 NOTE — Progress Notes (Signed)
Patient ID: Bobby Munoz, male   DOB: 09-07-1956, 58 y.o.   MRN: [id number]   Cardiology Office Note    Date:  06/30/2014   ID:  Bobby Munoz, DOB 09-07-1956, MRN [id number]  PCP:  Neldon LabellaMILLER,LISA LYNN, MD       History of Present Illness: Bobby Munoz is a 58 y.o. male with a hx of 2 vessel CAD s/p PCI of the mid LAD with a DES in 02/2014 and CTO PCI of the RCA in 8/15.   He is doing much better.  Denies further angina.  Denies significant DOE.  Denies orthopnea, PND, edema.  Denies syncope.   He continues to have significant palpitations.  Holter in 7/15 demonstrated NSR, PVCs (8%).  He denies significant caffeine intake.  He does note poor sleep and increased anxiety.  Still feels palpitations.  Yesterday at rehab PVC were prominent.   Studies:  - LHC (6/15):  Mid LAD 80%, RCA occluded, EF 50%, LVEDP 14 mmHg >>> PCI:  overlapping DESs of 2.5 x 38 and 3.0 x 38 mm (Xience) to mid LAD.  Attempted PCI of RCA unsuccessful.    - Successful PCI of CTO of mid RCA with 4 overlapping DES (04/20/14)  - Nuclear (6/15):  Inf scar with peri-infarct ischemia, EF 39%; INTERMEDIATE RISK   Recent Labs/Images: 04/21/2014: Hemoglobin 12.5*  05/13/2014: Creatinine 0.9; Potassium 4.2; TSH 1.49  06/29/2014: ALT 38; HDL Cholesterol by NMR 44.30; LDL (calc) 106*     Wt Readings from Last 3 Encounters:  06/30/14 178 lb (80.74 kg)  05/13/14 176 lb (79.833 kg)  04/21/14 180 lb 8.9 oz (81.9 kg)     Past Medical History  Diagnosis Date  . Hypertension   . LBBB (left bundle branch block) 2010  . Other and unspecified angina pectoris   . Coronary artery disease   . High cholesterol   . GERD (gastroesophageal reflux disease)   . Seasonal allergies     "fall and spring"  . Allergic to cats   . Pneumonia X 1  . Arthritis     "left shoulder" (04/20/2014)    Current Outpatient Prescriptions  Medication Sig Dispense Refill  . amLODipine (NORVASC) 5 MG tablet Take 1 tablet (5 mg total) by mouth daily.  90  tablet  1  . aspirin EC 81 MG tablet Take 81 mg by mouth daily.      Marland Kitchen. atorvastatin (LIPITOR) 40 MG tablet Take 1 tablet (40 mg total) by mouth daily at 6 PM.  30 tablet  5  . CINNAMON PO Take 1 tablet by mouth daily.      . citalopram (CELEXA) 20 MG tablet Take 40 mg by mouth daily.       . clopidogrel (PLAVIX) 75 MG tablet Take 1 tablet (75 mg total) by mouth daily with breakfast.  30 tablet  11  . Coenzyme Q10 (COQ10 PO) Take 1 capsule by mouth daily.      . Cyanocobalamin (B-12 PO) Take 1 tablet by mouth daily.      . isosorbide mononitrate (IMDUR) 30 MG 24 hr tablet Take 30 mg by mouth daily.      Marland Kitchen. losartan (COZAAR) 100 MG tablet 1/2 tablet twice a day  60 tablet  5  . metoprolol tartrate (LOPRESSOR) 25 MG tablet Take 1 tablet (25 mg total) by mouth 2 (two) times daily.  60 tablet  5  . nitroGLYCERIN (NITROSTAT) 0.4 MG SL tablet Place 1 tablet (0.4 mg total) under the  tongue every 5 (five) minutes as needed for chest pain.  25 tablet  3  . Omega-3 Fatty Acids (FISH OIL) 1000 MG CPDR Take 1,000 mg by mouth 2 (two) times daily.       . pantoprazole (PROTONIX) 40 MG tablet Take 1 tablet (40 mg total) by mouth daily.  30 tablet  5   No current facility-administered medications for this visit.     Allergies:   Latex   Social History:  The patient  reports that he has never smoked. He has never used smokeless tobacco. He reports that he drinks alcohol. He reports that he does not use illicit drugs.   Family History:  The patient's family history includes Heart attack in his father; Heart failure in his father.   ROS:  Please see the history of present illness.      All other systems reviewed and negative.   PHYSICAL EXAM: VS:  BP 136/90  Pulse 54  Ht 5\' 7"  (1.702 m)  Wt 178 lb (80.74 kg)  BMI 27.87 kg/m2 Well nourished, well developed, in no acute distress HEENT: normal Neck: no JVD Cardiac:  normal S1, S2; RRR; no murmur Lungs:  clear to auscultation bilaterally, no wheezing,  rhonchi or rales Abd: soft, nontender, no hepatomegaly Ext: no edemabilateral groins without hematoma or bruit  Skin: warm and dry Neuro:  CNs 2-12 intact, no focal abnormalities noted  EKG:  NSR, HR 50, LBBB      ASSESSMENT AND PLAN:  Coronary artery disease:  Doing well after CTO PCI of RCA.  He denies further angina. Continue current Rx with ASA, Plavix, statin, beta blocker.  Continue cardiac rehab.    Mixed hyperlipidemia:  Continue statin.  LDL 106 yesterday.  Increase exercise to get LDL <100.  Essential hypertension:  Good control.  He notes his BP goes up in the MD office.  He monitors closely at home and BPs have been optimal.    PVCs:  He had 8% PVCs on Holter in 2015.  EF has been low normal.  He has problems with anxiety and poor sleep. I have asked him to FU with his PCP for this.  His HR is too slow to increase his beta blocker.  I have asked him to eliminate all caffeine. OK to take an extra 25 mg metoprolol when PVCs are bothersome.  Sx better with exercise.      Signed, Everette RankJAY VARANASI, MD, [facility] 06/30/2014 2:29 PM    [facility] [address], [place], [place]  [address] Phone: [phone]; Fax: [phone]

## 2014-07-01 ENCOUNTER — Other Ambulatory Visit: Payer: Self-pay

## 2014-08-25 ENCOUNTER — Encounter (HOSPITAL_COMMUNITY): Payer: Self-pay | Admitting: Interventional Cardiology

## 2014-09-02 ENCOUNTER — Other Ambulatory Visit: Payer: Self-pay | Admitting: Physician Assistant

## 2014-09-08 ENCOUNTER — Other Ambulatory Visit: Payer: Self-pay | Admitting: Physician Assistant

## 2014-09-08 NOTE — Telephone Encounter (Signed)
Rx refill sent to patient pharmacy   

## 2014-09-11 ENCOUNTER — Other Ambulatory Visit: Payer: Self-pay | Admitting: Interventional Cardiology

## 2014-09-12 NOTE — Telephone Encounter (Signed)
Amlodipine refilled on 06/14/14 #90 with 1 refill (6 month supply) Refill refused

## 2014-09-13 ENCOUNTER — Other Ambulatory Visit: Payer: Self-pay | Admitting: Physician Assistant

## 2014-12-11 ENCOUNTER — Other Ambulatory Visit: Payer: Self-pay | Admitting: Interventional Cardiology

## 2014-12-29 ENCOUNTER — Ambulatory Visit (INDEPENDENT_AMBULATORY_CARE_PROVIDER_SITE_OTHER): Payer: Managed Care, Other (non HMO) | Admitting: Interventional Cardiology

## 2014-12-29 ENCOUNTER — Encounter: Payer: Self-pay | Admitting: Interventional Cardiology

## 2014-12-29 VITALS — BP 128/82 | HR 51 | Ht 67.0 in | Wt 193.8 lb

## 2014-12-29 DIAGNOSIS — I493 Ventricular premature depolarization: Secondary | ICD-10-CM

## 2014-12-29 DIAGNOSIS — E782 Mixed hyperlipidemia: Secondary | ICD-10-CM | POA: Diagnosis not present

## 2014-12-29 DIAGNOSIS — I10 Essential (primary) hypertension: Secondary | ICD-10-CM | POA: Diagnosis not present

## 2014-12-29 DIAGNOSIS — I2510 Atherosclerotic heart disease of native coronary artery without angina pectoris: Secondary | ICD-10-CM | POA: Diagnosis not present

## 2014-12-29 MED ORDER — ATORVASTATIN CALCIUM 80 MG PO TABS: 80.0000 mg | ORAL_TABLET | Freq: Every day | ORAL | Status: DC

## 2014-12-29 NOTE — Progress Notes (Signed)
Patient ID: Bobby Munoz, male   DOB: 13-Sep-1956, 59 y.o.   MRN: [id number]   Cardiology Office Note    Date:  12/29/2014   ID:  Bobby, Munoz March 05, 1956, MRN [id number]  PCP:  Neldon Labella, MD       History of Present Illness: [name] is a 59 y.o. male with a hx of 2 vessel CAD s/p PCI of the mid LAD with a DES in 02/2014 and CTO PCI of the RCA in 8/15.   He is doing much better.  Denies further angina even off of isosorbide.  Denies significant DOE.  Denies orthopnea, PND, edema.  Denies syncope.   He continues to have significant palpitations.  Holter in 7/15 demonstrated NSR, PVCs (8%).  He denies significant caffeine intake.  He does note poor sleep and increased anxiety.  Still feels palpitations, but not as bothersome as they were at the last visit.    Studies:  - LHC (6/15):  Mid LAD 80%, RCA occluded, EF 50%, LVEDP 14 mmHg >>> PCI:  overlapping DESs of 2.5 x 38 and 3.0 x 38 mm (Xience) to mid LAD.  Attempted PCI of RCA unsuccessful.    - Successful PCI of CTO of mid RCA with 4 overlapping DES (04/20/14)  - Nuclear (6/15):  Inf scar with peri-infarct ischemia, EF 39%; INTERMEDIATE RISK   Recent Labs/Images: 04/21/2014: Hemoglobin 12.5* 05/13/2014: Creatinine 0.9; Potassium 4.2; TSH 1.49 06/29/2014: ALT 38; HDL-C 44.30; LDL (calc) 106*    Wt Readings from Last 3 Encounters:  12/29/14 193 lb 12.8 oz (87.907 kg)  06/30/14 178 lb (80.74 kg)  05/13/14 176 lb (79.833 kg)     Past Medical History  Diagnosis Date  . Hypertension   . LBBB (left bundle branch block) 2010  . Other and unspecified angina pectoris   . Coronary artery disease   . High cholesterol   . GERD (gastroesophageal reflux disease)   . Seasonal allergies     "fall and spring"  . Allergic to cats   . Pneumonia X 1  . Arthritis     "left shoulder" (04/20/2014)    Current Outpatient Prescriptions  Medication Sig Dispense Refill  . amLODipine (NORVASC) 5 MG tablet TAKE 1 TABLET BY MOUTH DAILY 90  tablet 0  . aspirin EC 81 MG tablet Take 81 mg by mouth daily.    Marland Kitchen atorvastatin (LIPITOR) 80 MG tablet Take 1 tablet (80 mg total) by mouth daily. 30 tablet 11  . CINNAMON PO Take 1 tablet by mouth daily.    . citalopram (CELEXA) 40 MG tablet Take 40 mg by mouth daily.    . clopidogrel (PLAVIX) 75 MG tablet Take 1 tablet (75 mg total) by mouth daily with breakfast. 30 tablet 11  . Coenzyme Q10 (COQ10 PO) Take 1 capsule by mouth daily.    . Cyanocobalamin (B-12 PO) Take 1 tablet by mouth daily.    Marland Kitchen losartan (COZAAR) 100 MG tablet 1/2 tablet twice a day 60 tablet 5  . metoprolol tartrate (LOPRESSOR) 25 MG tablet TAKE 1 TABLET BY MOUTH TWICE DAILY 60 tablet 0  . nitroGLYCERIN (NITROSTAT) 0.4 MG SL tablet Place 1 tablet (0.4 mg total) under the tongue every 5 (five) minutes as needed for chest pain. 25 tablet 3  . Omega-3 Fatty Acids (FISH OIL) 1000 MG CPDR Take 1,000 mg by mouth 2 (two) times daily.     . pantoprazole (PROTONIX) 40 MG tablet TAKE 1 TABLET BY MOUTH EVERY DAY  30 tablet 9   No current facility-administered medications for this visit.     Allergies:   Latex   Social History:  The patient  reports that he has never smoked. He has never used smokeless tobacco. He reports that he drinks alcohol. He reports that he does not use illicit drugs.   Family History:  The patient's family history includes Healthy in his sister and sister; Heart attack in his father; Heart failure in his father; Hypertension in his mother.   ROS:  Please see the history of present illness.      All other systems reviewed and negative.   PHYSICAL EXAM: VS:  BP 128/82 mmHg  Pulse 51  Ht 5\' 7"  (1.702 m)  Wt 193 lb 12.8 oz (87.907 kg)  BMI 30.35 kg/m2  SpO2 97% Well nourished, well developed, in no acute distress HEENT: normal Neck: no JVD Cardiac:  normal S1, S2; RRR; no murmur Lungs:  clear to auscultation bilaterally, no wheezing, rhonchi or rales Abd: soft, nontender, no hepatomegaly Ext: no  edemabilateral groins without hematoma or bruit  Skin: warm and dry Neuro:  CNs 2-12 intact, no focal abnormalities noted Psych: normal affect        ASSESSMENT AND PLAN:  Coronary artery disease:  Doing well after CTO PCI of RCA.  He denies further angina. Continue current Rx with ASA, Plavix, statin, beta blocker.  Continue cardiac rehab.  Off of isosorbide.  Mixed hyperlipidemia:  Continue statin.  LDL 106.  Increase atorvastatin to 80 mg daily.  Increase exercise to get LDL <100.  We discussed goals of LDL.  He would like to be more aggressive for a goal of LDL close to 70.  We can only do this with medicine.  Will check lipids and LFTs in 2 months after the change.   Essential hypertension:  Good control.  He notes his BP goes up in the MD office.  He monitors closely at home and BPs have been optimal.  Controlled today here in the office.  COntinue current meds.   PVCs:  He had 8% PVCs on Holter in 2015.  EF has been low normal.  Seems better at this time from a PVC standpoint. His HR is too slow to increase his beta blocker.  I have asked him to eliminate all caffeine. OK to take an extra 25 mg metoprolol when PVCs are bothersome.  Sx better with exercise.      Signed, Everette RankJAY VARANASI, MD, [facility] 12/29/2014 10:46 AM    [facility] [address], [place], [place]  [address] Phone: [phone](336) [phone]; Fax: [phone]

## 2014-12-29 NOTE — Patient Instructions (Signed)
Medication Instructions:  Increase Atorvastatin to 80 mg daily.  Labwork: CMET and Lipids in 2 months (02/27/15). Please do not eat or drink after midnight the night before labs are drawn.  Testing/Procedures: None  Follow-Up: Your physician wants you to follow-up in: 9 months. You will receive a reminder letter in the mail two months in advance. If you don't receive a letter, please call our office to schedule the follow-up appointment.

## 2015-01-14 IMAGING — US US ABDOMEN COMPLETE
1 series · 14 of 25 positions shown · non-contrast
Comparison: None.

CLINICAL DATA: Epigastric pain.

EXAM:
ULTRASOUND ABDOMEN COMPLETE

[Series 1: us abdomen complete · 0.24mm/px · 14 of 55 slices shown]
[im 1/55]
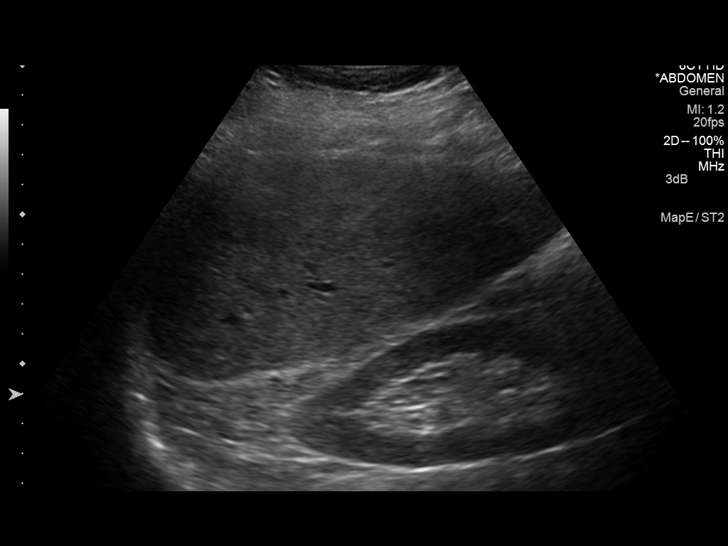
[im 5/55]
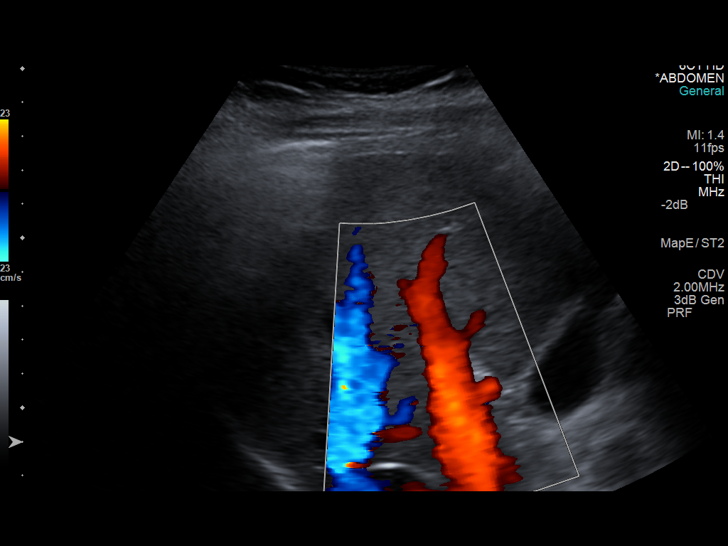
[im 10/55]
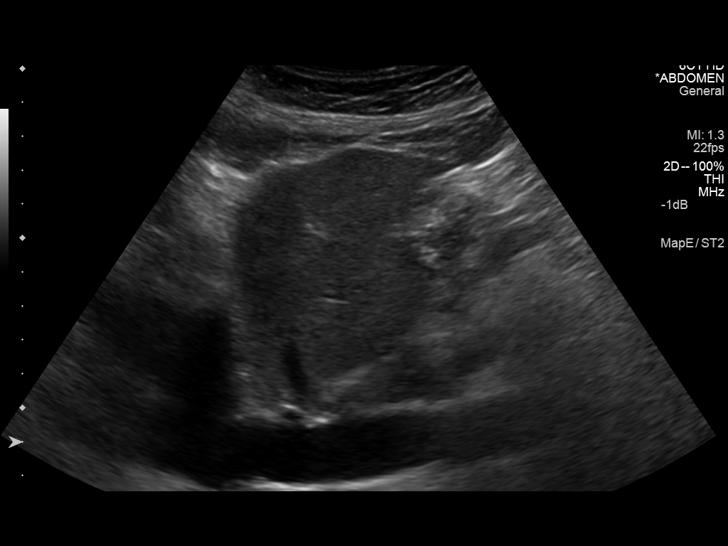
[im 14/55]
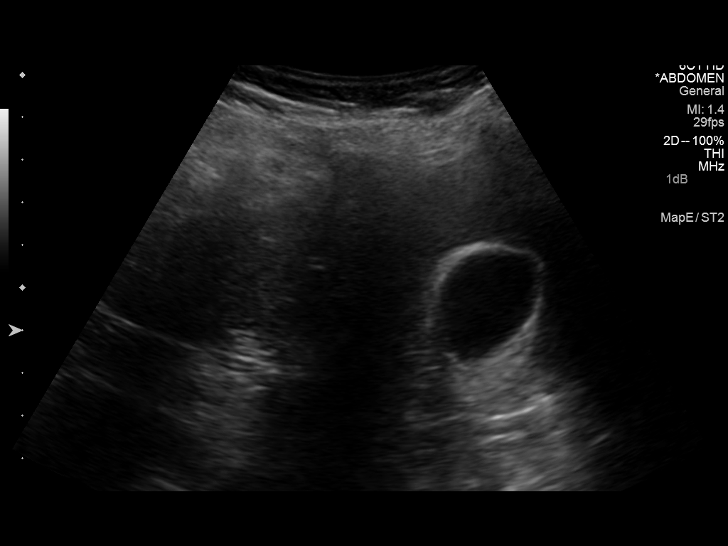
[im 19/55]
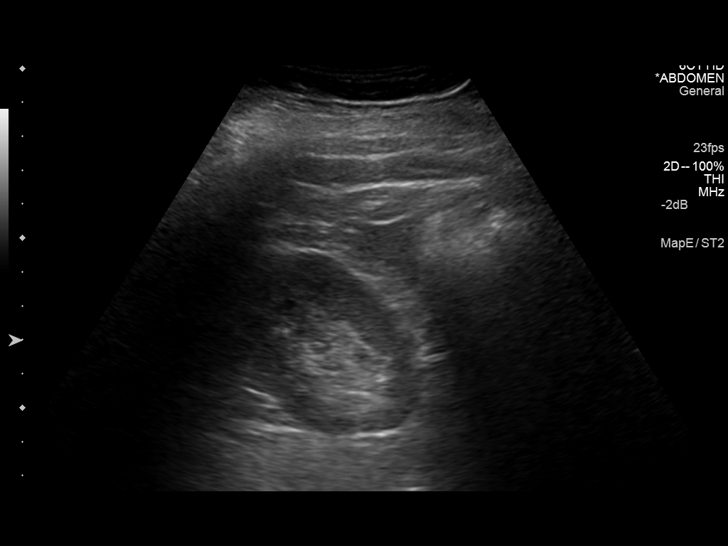
[im 21/55]
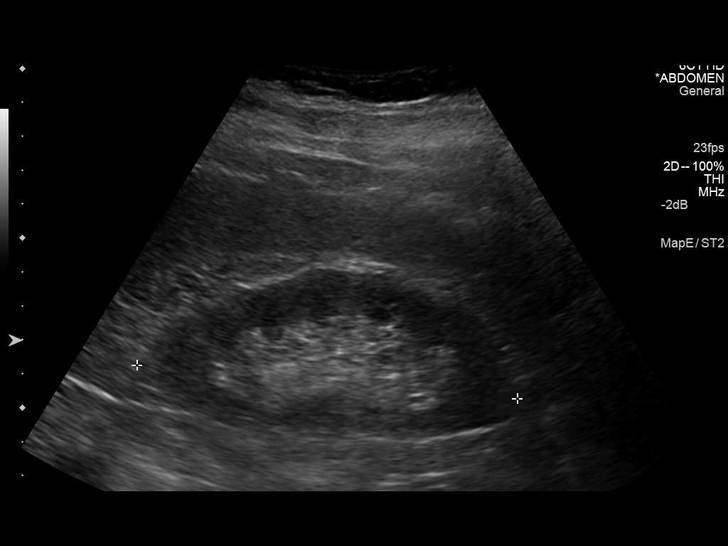
[im 25/55]
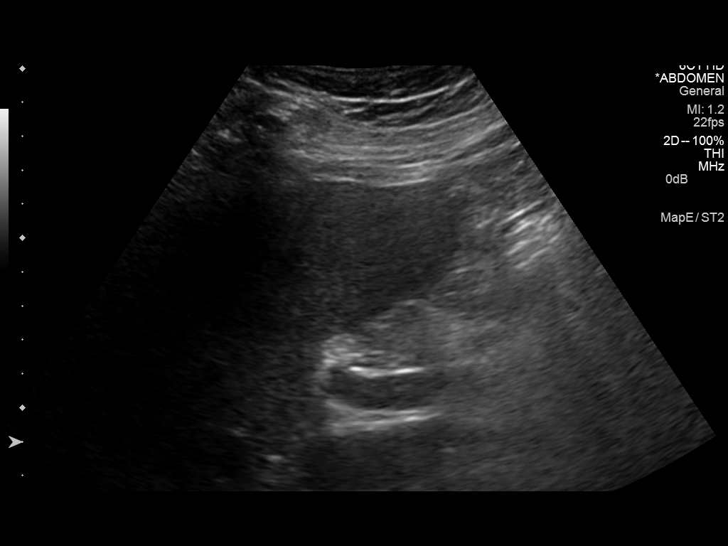
[im 30/55]
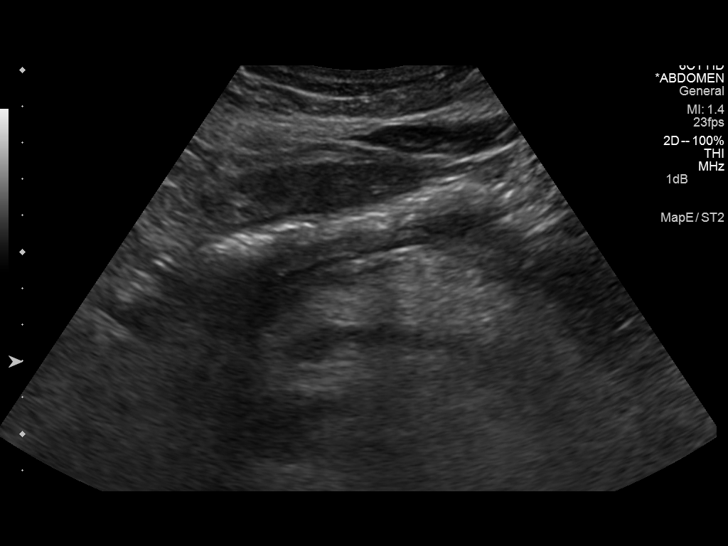
[im 34/55]
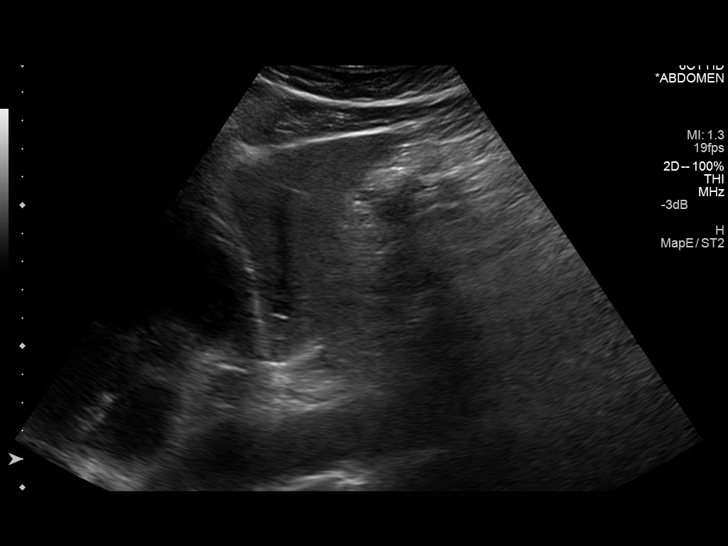
[im 37/55]
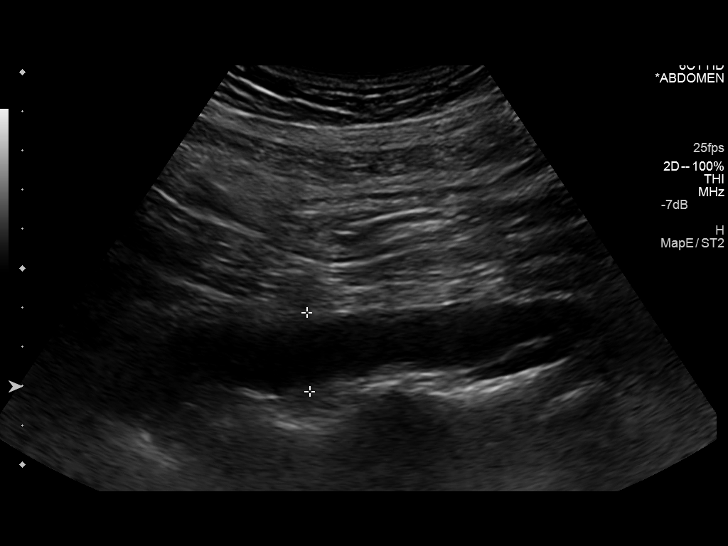
[im 41/55]
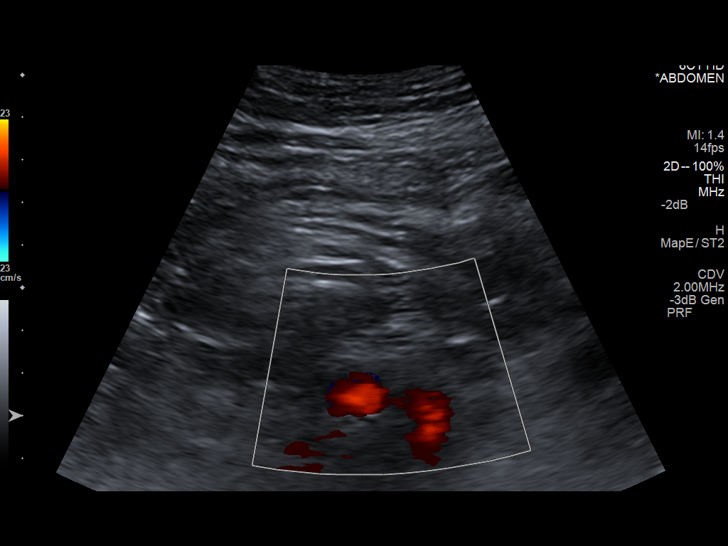
[im 46/55]
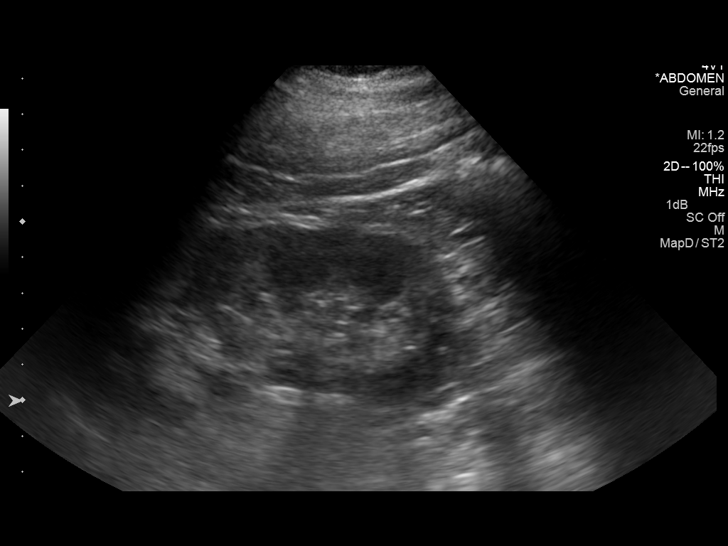
[im 50/55]
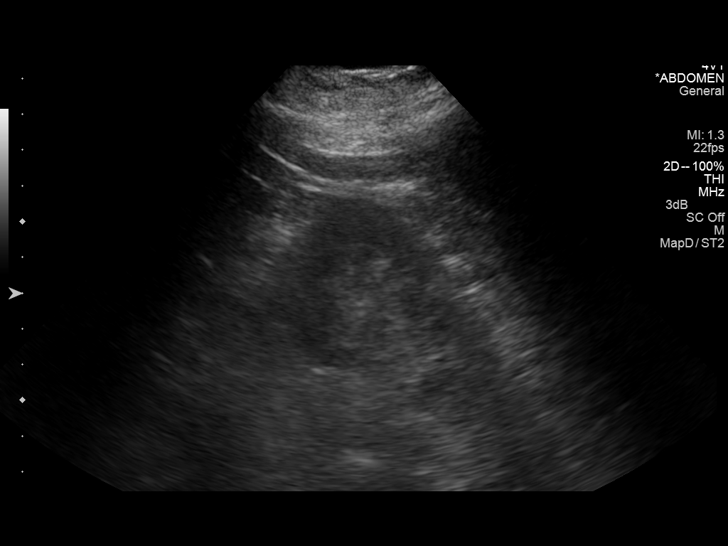
[im 55/55]
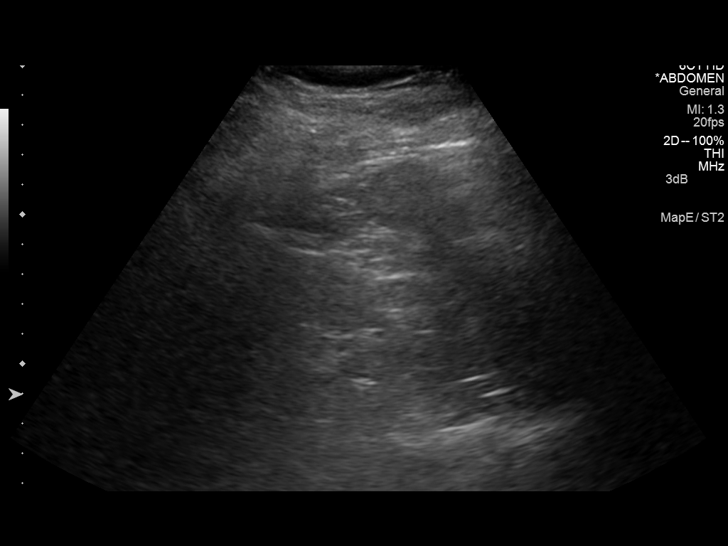

[14 of 25 positions shown; findings below may reference images not displayed]

FINDINGS: Gallbladder:

No gallstones or wall thickening visualized. No sonographic Murphy
sign noted.

Common bile duct:

Diameter: Normal at 3 mm.

Liver:

No focal lesion identified. Within normal limits in parenchymal
echogenicity.

IVC:

No abnormality visualized.

Pancreas:

No mass lesion or ductal dilation.

Spleen:

6.6 cm.  Normal echotexture.

Right Kidney:

Length: 11.3 cm. Echogenicity within normal limits. No mass or
hydronephrosis visualized.

Left Kidney:

Length: 11.7 cm. Echogenicity within normal limits. No mass or
hydronephrosis visualized.

Abdominal aorta:

No aneurysm visualized.

Other findings:

None.
IMPRESSION: Normal abdominal ultrasound. Negative for cholelithiasis or
cholecystitis.

## 2015-02-02 ENCOUNTER — Other Ambulatory Visit: Payer: Self-pay | Admitting: *Deleted

## 2015-02-02 ENCOUNTER — Telehealth: Payer: Self-pay | Admitting: Interventional Cardiology

## 2015-02-02 MED ORDER — METOPROLOL TARTRATE 25 MG PO TABS: 25.0000 mg | ORAL_TABLET | Freq: Two times a day (BID) | ORAL | Status: DC

## 2015-02-03 NOTE — Telephone Encounter (Signed)
[facility]

## 2015-02-27 ENCOUNTER — Other Ambulatory Visit (INDEPENDENT_AMBULATORY_CARE_PROVIDER_SITE_OTHER): Payer: Managed Care, Other (non HMO) | Admitting: *Deleted

## 2015-02-27 DIAGNOSIS — E782 Mixed hyperlipidemia: Secondary | ICD-10-CM | POA: Diagnosis not present

## 2015-02-27 LAB — COMPREHENSIVE METABOLIC PANEL WITH GFR
ALT: 35 U/L (ref 0–53)
AST: 28 U/L (ref 0–37)
Albumin: 4.2 g/dL (ref 3.5–5.2)
Alkaline Phosphatase: 52 U/L (ref 39–117)
BUN: 18 mg/dL (ref 6–23)
CO2: 28 meq/L (ref 19–32)
Calcium: 8.7 mg/dL (ref 8.4–10.5)
Chloride: 104 meq/L (ref 96–112)
Creatinine, Ser: 0.79 mg/dL (ref 0.40–1.50)
GFR: 106.52 mL/min
Glucose, Bld: 105 mg/dL — ABNORMAL HIGH (ref 70–99)
Potassium: 4.1 meq/L (ref 3.5–5.1)
Sodium: 137 meq/L (ref 135–145)
Total Bilirubin: 0.7 mg/dL (ref 0.2–1.2)
Total Protein: 7.1 g/dL (ref 6.0–8.3)

## 2015-02-27 LAB — LIPID PANEL
Cholesterol: 154 mg/dL (ref 0–200)
HDL: 45.4 mg/dL (ref >39.00)
LDL Cholesterol: 78 mg/dL (ref 0–99)
NONHDL: 108.6
Total CHOL/HDL Ratio: 3
Triglycerides: 155 mg/dL — ABNORMAL HIGH (ref 0.0–149.0)
VLDL: 31 mg/dL (ref 0.0–40.0)

## 2015-02-27 NOTE — Addendum Note (Signed)
Addended by: Tonita Phoenix on: 02/27/2015 08:46 AM   Modules accepted: Orders

## 2015-02-28 ENCOUNTER — Other Ambulatory Visit: Payer: Self-pay | Admitting: Physician Assistant

## 2015-03-07 ENCOUNTER — Other Ambulatory Visit: Payer: Self-pay | Admitting: Interventional Cardiology

## 2015-06-20 ENCOUNTER — Other Ambulatory Visit: Payer: Self-pay | Admitting: Physician Assistant

## 2015-06-30 ENCOUNTER — Other Ambulatory Visit: Payer: Self-pay | Admitting: Interventional Cardiology

## 2015-06-30 ENCOUNTER — Other Ambulatory Visit: Payer: Self-pay

## 2015-06-30 MED ORDER — PANTOPRAZOLE SODIUM 40 MG PO TBEC: 40.0000 mg | DELAYED_RELEASE_TABLET | Freq: Every day | ORAL | Status: DC

## 2015-08-15 ENCOUNTER — Other Ambulatory Visit: Payer: Self-pay | Admitting: Interventional Cardiology

## 2015-08-19 ENCOUNTER — Emergency Department (HOSPITAL_COMMUNITY): Payer: Managed Care, Other (non HMO)

## 2015-08-19 ENCOUNTER — Encounter (HOSPITAL_COMMUNITY): Payer: Self-pay | Admitting: Emergency Medicine

## 2015-08-19 ENCOUNTER — Emergency Department (HOSPITAL_COMMUNITY)
Admission: EM | Admit: 2015-08-19 | Discharge: 2015-08-19 | Disposition: A | Discharge status: Home / Self Care | Payer: Managed Care, Other (non HMO) | Attending: Emergency Medicine | Admitting: Emergency Medicine

## 2015-08-19 DIAGNOSIS — Y998 Other external cause status: Secondary | ICD-10-CM | POA: Insufficient documentation

## 2015-08-19 DIAGNOSIS — Z79899 Other long term (current) drug therapy: Secondary | ICD-10-CM | POA: Diagnosis not present

## 2015-08-19 DIAGNOSIS — E7800 Pure hypercholesterolemia, unspecified: Secondary | ICD-10-CM | POA: Insufficient documentation

## 2015-08-19 DIAGNOSIS — Z9889 Other specified postprocedural states: Secondary | ICD-10-CM | POA: Insufficient documentation

## 2015-08-19 DIAGNOSIS — Z91040 Latex allergy status: Secondary | ICD-10-CM | POA: Insufficient documentation

## 2015-08-19 DIAGNOSIS — Y9389 Activity, other specified: Secondary | ICD-10-CM | POA: Diagnosis not present

## 2015-08-19 DIAGNOSIS — K219 Gastro-esophageal reflux disease without esophagitis: Secondary | ICD-10-CM | POA: Diagnosis not present

## 2015-08-19 DIAGNOSIS — W11XXXA Fall on and from ladder, initial encounter: Secondary | ICD-10-CM | POA: Diagnosis not present

## 2015-08-19 DIAGNOSIS — Z7982 Long term (current) use of aspirin: Secondary | ICD-10-CM | POA: Diagnosis not present

## 2015-08-19 DIAGNOSIS — Z9861 Coronary angioplasty status: Secondary | ICD-10-CM | POA: Insufficient documentation

## 2015-08-19 DIAGNOSIS — Y9289 Other specified places as the place of occurrence of the external cause: Secondary | ICD-10-CM | POA: Diagnosis not present

## 2015-08-19 DIAGNOSIS — S79911A Unspecified injury of right hip, initial encounter: Secondary | ICD-10-CM | POA: Diagnosis present

## 2015-08-19 DIAGNOSIS — Z7902 Long term (current) use of antithrombotics/antiplatelets: Secondary | ICD-10-CM | POA: Insufficient documentation

## 2015-08-19 DIAGNOSIS — Z8701 Personal history of pneumonia (recurrent): Secondary | ICD-10-CM | POA: Insufficient documentation

## 2015-08-19 DIAGNOSIS — I25119 Atherosclerotic heart disease of native coronary artery with unspecified angina pectoris: Secondary | ICD-10-CM | POA: Insufficient documentation

## 2015-08-19 DIAGNOSIS — M19012 Primary osteoarthritis, left shoulder: Secondary | ICD-10-CM | POA: Insufficient documentation

## 2015-08-19 DIAGNOSIS — S300XXA Contusion of lower back and pelvis, initial encounter: Secondary | ICD-10-CM | POA: Diagnosis not present

## 2015-08-19 DIAGNOSIS — I10 Essential (primary) hypertension: Secondary | ICD-10-CM | POA: Diagnosis not present

## 2015-08-19 MED ORDER — HYDROCODONE-ACETAMINOPHEN 5-325 MG PO TABS: 1.0000 | ORAL_TABLET | ORAL | Status: DC | PRN

## 2015-08-19 MED ORDER — HYDROCODONE-ACETAMINOPHEN 5-325 MG PO TABS
2.0000 | ORAL_TABLET | Freq: Once | ORAL | Status: AC
  Administered 2015-08-19: 2 via ORAL
  Filled 2015-08-19: qty 2

## 2015-08-19 NOTE — ED Notes (Signed)
Patient reports falling approx 8 ft off of ladder onto right side, complaining of right lower back pain. No loc. Patient ambulatory. No numbness or tingling to lower extremities.

## 2015-08-19 NOTE — ED Provider Notes (Signed)
CSN: [id number]     Arrival date & time 08/19/15  1948 History   First MD Initiated Contact with Patient 08/19/15 2118     Chief Complaint  Patient presents with  . Fall     (Consider location/radiation/quality/duration/timing/severity/associated sxs/prior Treatment) HPI  Bobby Munoz is a 59 y.o. male who presents for evaluation of pain in right hip after fall from ladder. He was putting up decorations slipped and fell, landing on his right buttocks. Feels like he hit his head in the fall. He denies headache, neck pain or back pain. He has been able to ambulate. No loss of bowel or bladder continence. He tried heat and topical application of analgesia, without relief. No shortness of breath or chest pain. There are no other no modifying factors.    Past Medical History  Diagnosis Date  . Hypertension   . LBBB (left bundle branch block) 2010  . Other and unspecified angina pectoris   . Coronary artery disease   . High cholesterol   . GERD (gastroesophageal reflux disease)   . Seasonal allergies     "fall and spring"  . Allergic to cats   . Pneumonia X 1  . Arthritis     "left shoulder" (04/20/2014)   Past Surgical History  Procedure Laterality Date  . Tonsillectomy    . Cardiovascular stress test  2011  . Cardiac catheterization  ~ 2000; 03/08/2014  . Coronary angioplasty with stent placement  03/09/2014; 04/20/2014    "2 + 4"  . Cardiac catheterization  04/20/2014    PCI of a chronic total occlusion in the mid right coronary artery.  There were 4 overlapping stents placed   . Left heart catheterization with coronary angiogram N/A 03/08/2014    Procedure: LEFT HEART CATHETERIZATION WITH CORONARY ANGIOGRAM;  Surgeon: Corky CraftsJayadeep S Varanasi, MD;  [facility];  Service: Cardiovascular;  Laterality: N/A;  . Percutaneous coronary stent intervention (pci-s) N/A 03/09/2014    Procedure: PERCUTANEOUS CORONARY STENT INTERVENTION (PCI-S);  Surgeon: Corky CraftsJayadeep S Varanasi, MD;  [facility];  Service: Cardiovascular;  Laterality: N/A;  . Percutaneous coronary stent intervention (pci-s) N/A 04/20/2014    Procedure: PERCUTANEOUS CORONARY STENT INTERVENTION (PCI-S);  Surgeon: Corky CraftsJayadeep S Varanasi, MD;  [facility];  Service: Cardiovascular;  Laterality: N/A;  . Cardiac catheterization  04/20/2014    Procedure: LEFT HEART CATH;  Surgeon: Corky CraftsJayadeep S Varanasi, MD;  [facility];  Service: Cardiovascular;;   Family History  Problem Relation Age of Onset  . Heart failure Father   . Heart attack Father   . Hypertension Mother   . Healthy Sister   . Healthy Sister    Social History  Substance Use Topics  . Smoking status: Never Smoker   . Smokeless tobacco: Never Used  . Alcohol Use: Yes     Comment: 04/20/2014 "used to have couple glasses of wine/wk; none since 02/2014"    Review of Systems  All other systems reviewed and are negative.     Allergies  Latex  Home Medications   Prior to Admission medications   Medication Sig Start Date End Date Taking? Authorizing Provider  amLODipine (NORVASC) 5 MG tablet TAKE 1 TABLET BY MOUTH DAILY 03/08/15   Corky CraftsJayadeep S Varanasi, MD  aspirin EC 81 MG tablet Take 81 mg by mouth daily.    Historical Provider, MD  atorvastatin (LIPITOR) 80 MG tablet Take 1 tablet (80 mg total) by mouth daily. 12/29/14   Donnie CoffinJayadeep S  Eldridge Dace, MD  CINNAMON PO Take 1 tablet by mouth daily.    Historical Provider, MD  citalopram (CELEXA) 40 MG tablet Take 40 mg by mouth daily.    Historical Provider, MD  clopidogrel (PLAVIX) 75 MG tablet TAKE 1 TABLET BY MOUTH EVERY DAY WITH BREAKFAST 08/15/15   Corky Crafts, MD  Coenzyme Q10 (COQ10 PO) Take 1 capsule by mouth daily.    Historical Provider, MD  Cyanocobalamin (B-12 PO) Take 1 tablet by mouth daily.    Historical Provider, MD  losartan (COZAAR) 100 MG tablet TAKE HALF TABLET BY MOUTH TWICE DAILY 06/20/15   Corky Crafts, MD  metoprolol tartrate (LOPRESSOR) 25 MG tablet Take 1  tablet (25 mg total) by mouth 2 (two) times daily. 02/02/15   Corky Crafts, MD  nitroGLYCERIN (NITROSTAT) 0.4 MG SL tablet Place 1 tablet (0.4 mg total) under the tongue every 5 (five) minutes as needed for chest pain. 03/21/14   Dyann Kief, PA-C  Omega-3 Fatty Acids (FISH OIL) 1000 MG CPDR Take 1,000 mg by mouth 2 (two) times daily.  02/28/14   Corky Crafts, MD  pantoprazole (PROTONIX) 40 MG tablet Take 1 tablet (40 mg total) by mouth daily. 06/30/15   Corky Crafts, MD   BP 129/82 mmHg  Pulse 69  Temp(Src) 99.7 F (37.6 C)  Resp 16  Ht  (1.702 m)  Wt 170 lb (77.111 kg)  BMI 26.62 kg/m2  SpO2 94% Physical Exam  Constitutional: He is oriented to person, place, and time. He appears well-developed and well-nourished.  HENT:  Head: Normocephalic and atraumatic.  Right Ear: External ear normal.  Left Ear: External ear normal.  No visible or palpable contusion of the head.  Eyes: Conjunctivae and EOM are normal. Pupils are equal, round, and reactive to light.  Neck: Normal range of motion and phonation normal. Neck supple.  Cardiovascular: Normal rate, regular rhythm and normal heart sounds.   Pulmonary/Chest: Effort normal and breath sounds normal. No respiratory distress. He exhibits no tenderness and no bony tenderness.  Abdominal: Soft. There is no tenderness.  Musculoskeletal: Normal range of motion.  Mild tenderness right posterior pelvis region, without crepitation, deformity, laceration or abrasion. No tenderness of the cervical, thoracic or lumbar spines.  Neurological: He is alert and oriented to person, place, and time. No cranial nerve deficit or sensory deficit. He exhibits normal muscle tone. Coordination normal.  Skin: Skin is warm, dry and intact.  Psychiatric: He has a normal mood and affect. His behavior is normal. Judgment and thought content normal.  Nursing note and vitals reviewed.   ED Course  Procedures (including critical care  time)  Medications  HYDROcodone-acetaminophen (NORCO/VICODIN) 5-325 MG per tablet 2 tablet (not administered)    Patient Vitals for the past 24 hrs:  BP Temp Pulse Resp SpO2 Height Weight  08/19/15 1957 129/82 mmHg 99.7 F (37.6 C) 69 16 94 %  (1.702 m) 170 lb (77.111 kg)    9:38 PM Reevaluation with update and discussion. After initial assessment and treatment, an updated evaluation reveals no further complains. Findings discussed with patient and wife, all questions answered. Naraya Stoneberg L    Labs Review Labs Reviewed - No data to display  Imaging Review Dg Hip Unilat With Pelvis 2-3 Views Right  08/19/2015  CLINICAL DATA:  Posterior right hip pain. Fall from ladder landing on the right side. EXAM: DG HIP (WITH OR WITHOUT PELVIS) 2-3V RIGHT COMPARISON:  None available. FINDINGS: The right  hip is located. No acute fracture is present. Edematous changes are present in the soft tissues over the right hip. Pelvis is intact. IMPRESSION: 1. No acute osseous abnormality. 2. Edematous changes over the right hip. Electronically Signed   By: Marin Roberts M.D.   On: 08/19/2015 20:57   I have personally reviewed and evaluated these images and lab results as part of my medical decision-making.   EKG Interpretation None      MDM   Final diagnoses:  Contusion of pelvis, initial encounter    Contusion without evident fracture or spine injury.   Nursing Notes Reviewed/ Care Coordinated Applicable Imaging Reviewed Interpretation of Laboratory Data incorporated into ED treatment  The patient appears reasonably screened and/or stabilized for discharge and I doubt any other medical condition or other [facility] requiring further screening, evaluation, or treatment in the ED at this time prior to discharge.  Plan: Home Medications- Norco; Home Treatments- rest; return here if the recommended treatment, does not improve the symptoms; Recommended follow up- PCP prn     Mancel Bale, MD 08/19/15 2140

## 2015-08-19 NOTE — ED Notes (Signed)
Pt is in x-ray and will be brought back to E42

## 2015-08-19 NOTE — Discharge Instructions (Signed)

## 2015-08-19 NOTE — ED Notes (Signed)
Pt. lost his balance and fell from a ladder this afternoon , no LOC /ambulatory , reports pain at right buttocks , pain increases with movement . Alert and oriented/ respirations unlabored .

## 2015-09-19 ENCOUNTER — Other Ambulatory Visit: Payer: Self-pay

## 2015-09-19 MED ORDER — LOSARTAN POTASSIUM 100 MG PO TABS: ORAL_TABLET | ORAL | Status: DC

## 2015-09-19 NOTE — Telephone Encounter (Signed)
Bobby CraftsJayadeep S Varanasi, MD at 12/29/2014 10:45 AM  losartan (COZAAR) 100 MG tablet1/2 tablet twice a day Patient Instructions     Medication Instructions:  Increase Atorvastatin to 80 mg daily.

## 2015-10-15 ENCOUNTER — Other Ambulatory Visit: Payer: Self-pay | Admitting: Interventional Cardiology

## 2015-11-10 ENCOUNTER — Other Ambulatory Visit: Payer: Self-pay | Admitting: Interventional Cardiology

## 2015-11-25 ENCOUNTER — Other Ambulatory Visit: Payer: Self-pay | Admitting: Interventional Cardiology

## 2015-12-28 ENCOUNTER — Other Ambulatory Visit: Payer: Self-pay | Admitting: Interventional Cardiology

## 2016-01-09 ENCOUNTER — Other Ambulatory Visit: Payer: Self-pay

## 2016-01-09 ENCOUNTER — Other Ambulatory Visit: Payer: Self-pay | Admitting: Interventional Cardiology

## 2016-01-09 MED ORDER — CLOPIDOGREL BISULFATE 75 MG PO TABS: 75.0000 mg | ORAL_TABLET | Freq: Every day | ORAL | Status: DC

## 2016-01-09 MED ORDER — ATORVASTATIN CALCIUM 80 MG PO TABS: 80.0000 mg | ORAL_TABLET | Freq: Every day | ORAL | Status: DC

## 2016-02-01 ENCOUNTER — Other Ambulatory Visit: Payer: Self-pay | Admitting: Interventional Cardiology

## 2016-02-06 ENCOUNTER — Other Ambulatory Visit: Payer: Self-pay

## 2016-02-06 MED ORDER — METOPROLOL TARTRATE 25 MG PO TABS: 25.0000 mg | ORAL_TABLET | Freq: Two times a day (BID) | ORAL | Status: DC

## 2016-02-06 NOTE — Telephone Encounter (Signed)
Bobby CraftsJayadeep S Varanasi, MD at 12/29/2014 10:45 AM  metoprolol tartrate (LOPRESSOR) 25 MG tabletTAKE 1 TABLET BY MOUTH TWICE DAILY ASSESSMENT AND PLAN:  Coronary artery disease: Doing well after CTO PCI of RCA. He denies further angina. Continue current Rx with ASA, Plavix, statin, beta blocker. Continue cardiac rehab. Off of isosorbide.   Patient Instructions     Medication Instructions:  Increase Atorvastatin to 80 mg daily.

## 2016-02-09 ENCOUNTER — Other Ambulatory Visit: Payer: Self-pay | Admitting: *Deleted

## 2016-02-09 MED ORDER — CLOPIDOGREL BISULFATE 75 MG PO TABS: 75.0000 mg | ORAL_TABLET | Freq: Every day | ORAL | Status: DC

## 2016-02-15 ENCOUNTER — Ambulatory Visit (INDEPENDENT_AMBULATORY_CARE_PROVIDER_SITE_OTHER): Payer: Managed Care, Other (non HMO) | Admitting: Interventional Cardiology

## 2016-02-15 ENCOUNTER — Encounter: Payer: Self-pay | Admitting: Interventional Cardiology

## 2016-02-15 VITALS — BP 130/89 | HR 60 | Ht 67.0 in | Wt 204.0 lb

## 2016-02-15 DIAGNOSIS — E669 Obesity, unspecified: Secondary | ICD-10-CM | POA: Insufficient documentation

## 2016-02-15 DIAGNOSIS — I10 Essential (primary) hypertension: Secondary | ICD-10-CM | POA: Diagnosis not present

## 2016-02-15 DIAGNOSIS — I447 Left bundle-branch block, unspecified: Secondary | ICD-10-CM | POA: Diagnosis not present

## 2016-02-15 DIAGNOSIS — I2510 Atherosclerotic heart disease of native coronary artery without angina pectoris: Secondary | ICD-10-CM

## 2016-02-15 DIAGNOSIS — E782 Mixed hyperlipidemia: Secondary | ICD-10-CM

## 2016-02-15 LAB — COMPREHENSIVE METABOLIC PANEL
ALBUMIN: 4.1 g/dL (ref 3.6–5.1)
ALK PHOS: 50 U/L (ref 40–115)
ALT: 24 U/L (ref 9–46)
AST: 20 U/L (ref 10–35)
BUN: 19 mg/dL (ref 7–25)
CHLORIDE: 106 mmol/L (ref 98–110)
CO2: 26 mmol/L (ref 20–31)
CREATININE: 0.85 mg/dL (ref 0.70–1.25)
Calcium: 8.7 mg/dL (ref 8.6–10.3)
Glucose, Bld: 114 mg/dL — ABNORMAL HIGH (ref 65–99)
POTASSIUM: 5 mmol/L (ref 3.5–5.3)
Sodium: 140 mmol/L (ref 135–146)
TOTAL PROTEIN: 6.7 g/dL (ref 6.1–8.1)
Total Bilirubin: 0.8 mg/dL (ref 0.2–1.2)

## 2016-02-15 LAB — LIPID PANEL
CHOL/HDL RATIO: 3.4 ratio (ref <=5.0)
CHOLESTEROL: 158 mg/dL (ref 125–200)
HDL: 46 mg/dL (ref >=40)
LDL Cholesterol: 81 mg/dL (ref <130)
TRIGLYCERIDES: 157 mg/dL — AB (ref <150)
VLDL: 31 mg/dL — AB (ref <30)

## 2016-02-15 MED ORDER — NITROGLYCERIN 0.4 MG SL SUBL: 0.4000 mg | SUBLINGUAL_TABLET | SUBLINGUAL | Status: DC | PRN

## 2016-02-15 NOTE — Patient Instructions (Signed)
Medication Instructions:  Same-no changes  Labwork: Today-Lipids and CMET  Testing/Procedures: None  Follow-Up: Your physician wants you to follow-up in: 1 year. You will receive a reminder letter in the mail two months in advance. If you don't receive a letter, please call our office to schedule the follow-up appointment.     If you need a refill on your cardiac medications before your next appointment, please call your pharmacy.   

## 2016-02-15 NOTE — Progress Notes (Signed)
Cardiology Office Note   Date:  02/15/2016   ID:  Bobby Munoz, DOB 06/02/1956, MRN [id number]  PCP:  Neldon LabellaMILLER,LISA LYNN, MD    No chief complaint on file.  F/u CAD  Wt Readings from Last 3 Encounters:  02/15/16 204 lb (92.534 kg)  08/19/15 170 lb (77.111 kg)  12/29/14 193 lb 12.8 oz (87.907 kg)       History of Present Illness: Bobby Munoz is a 60 y.o. male   with a hx of 2 vessel CAD s/p PCI of the mid LAD with a DES in 02/2014 and CTO PCI of the RCA in 8/15. He is doing much better. Denies further angina even off of isosorbide. Denies significant DOE. Denies orthopnea, PND, edema. Denies syncope. He continues to have significant palpitations. Holter in 7/15 demonstrated NSR, PVCs (8%). He denies significant caffeine intake. He does note poor sleep and increased anxiety. Felt palpitations in the past, but they have resolved since the last visit.    Studies: - LHC (6/15): Mid LAD 80%, RCA occluded, EF 50%, LVEDP 14 mmHg >>> PCI: overlapping DESs of 2.5 x 38 and 3.0 x 38 mm (Xience) to mid LAD. Attempted PCI of RCA unsuccessful.  - Successful PCI of CTO of mid RCA with 4 overlapping DES (04/20/14) - Nuclear (6/15): Inf scar with peri-infarct ischemia, EF 39%; INTERMEDIATE RISK  He has gained significant weight after losing a bunch immediately after her stents. He feels he is eating healthy. He is going to try to increase the intensity of his exercise. He denies any symptoms like what he had prior to his revascularization.    Past Medical History  Diagnosis Date  . Hypertension   . LBBB (left bundle branch block) 2010  . Other and unspecified angina pectoris   . Coronary artery disease   . High cholesterol   . GERD (gastroesophageal reflux disease)   . Seasonal allergies     "fall and spring"  . Allergic to cats   . Pneumonia X 1  . Arthritis     "left shoulder" (04/20/2014)    Past Surgical History  Procedure Laterality Date  . Tonsillectomy    .  Cardiovascular stress test  2011  . Cardiac catheterization  ~ 2000; 03/08/2014  . Coronary angioplasty with stent placement  03/09/2014; 04/20/2014    "2 + 4"  . Cardiac catheterization  04/20/2014    PCI of a chronic total occlusion in the mid right coronary artery.  There were 4 overlapping stents placed   . Left heart catheterization with coronary angiogram N/A 03/08/2014    Procedure: LEFT HEART CATHETERIZATION WITH CORONARY ANGIOGRAM;  Surgeon: Corky CraftsJayadeep S Teliyah Royal, MD;  [facility];  Service: Cardiovascular;  Laterality: N/A;  . Percutaneous coronary stent intervention (pci-s) N/A 03/09/2014    Procedure: PERCUTANEOUS CORONARY STENT INTERVENTION (PCI-S);  Surgeon: Corky CraftsJayadeep S Makyla Bye, MD;  [facility];  Service: Cardiovascular;  Laterality: N/A;  . Percutaneous coronary stent intervention (pci-s) N/A 04/20/2014    Procedure: PERCUTANEOUS CORONARY STENT INTERVENTION (PCI-S);  Surgeon: Corky CraftsJayadeep S Adna Nofziger, MD;  [facility];  Service: Cardiovascular;  Laterality: N/A;  . Cardiac catheterization  04/20/2014    Procedure: LEFT HEART CATH;  Surgeon: Corky CraftsJayadeep S Stephanieann Popescu, MD;  [facility];  Service: Cardiovascular;;     Current Outpatient Prescriptions  Medication Sig Dispense Refill  . amLODipine (NORVASC) 5 MG tablet TAKE 1 TABLET BY MOUTH DAILY 30 tablet 0  . aspirin  EC 81 MG tablet Take 81 mg by mouth daily.    Marland Kitchen atorvastatin (LIPITOR) 80 MG tablet TAKE 1 TABLET BY MOUTH DAILY 30 tablet 0  . citalopram (CELEXA) 40 MG tablet Take 40 mg by mouth daily.    . clopidogrel (PLAVIX) 75 MG tablet Take 1 tablet (75 mg total) by mouth daily. KEEP OV. 30 tablet 1  . Coenzyme Q10 (COQ10 PO) Take 1 capsule by mouth daily.    . Cyanocobalamin (B-12 PO) Take 1 tablet by mouth daily.    Marland Kitchen losartan (COZAAR) 100 MG tablet TAKE 1/2 TABLET BY MOUTH TWICE DAILY 30 tablet 0  . metoprolol tartrate (LOPRESSOR) 25 MG tablet Take 1 tablet (25 mg total) by mouth 2 (two) times daily. 60  tablet 0  . nitroGLYCERIN (NITROSTAT) 0.4 MG SL tablet Place 0.4 mg under the tongue every 5 (five) minutes as needed for chest pain (X3 DOSES BEFORE CALLING 911).    . Omega-3 Fatty Acids (FISH OIL) 1000 MG CPDR Take 1,000 mg by mouth 2 (two) times daily.     . pantoprazole (PROTONIX) 40 MG tablet Take 1 tablet (40 mg total) by mouth daily. 30 tablet 9   No current facility-administered medications for this visit.    Allergies:   Latex    Social History:  The patient  reports that he has never smoked. He has never used smokeless tobacco. He reports that he drinks alcohol. He reports that he does not use illicit drugs.   Family History:  The patient's family history includes Healthy in his sister and sister; Heart attack in his father; Heart failure in his father; Hypertension in his mother.    ROS:  Please see the history of present illness.   Otherwise, review of systems are positive for easy bruising.   All other systems are reviewed and negative.    PHYSICAL EXAM: VS:  BP 130/89 mmHg  Pulse 60  Ht  (1.702 m)  Wt 204 lb (92.534 kg)  BMI 31.94 kg/m2 , BMI Body mass index is 31.94 kg/(m^2). GEN: Well nourished, well developed, in no acute distress HEENT: normal Neck: no JVD, carotid bruits, or masses Cardiac: RRR; no murmurs, rubs, or gallops,no edema  Respiratory:  clear to auscultation bilaterally, normal work of breathing GI: soft, nontender, nondistended, + BS MS: no deformity or atrophy Skin: warm and dry, no rash Neuro:  Strength and sensation are intact Psych: euthymic mood, full affect   EKG:   The ekg ordered today demonstrates NSR. LBBB   Recent Labs: 02/27/2015: ALT 35; BUN 18; Creatinine, Ser 0.79; Potassium 4.1; Sodium 137   Lipid Panel    Component Value Date/Time   CHOL 154 02/27/2015 0846   TRIG 155.0* 02/27/2015 0846   HDL 45.40 02/27/2015 0846   CHOLHDL 3 02/27/2015 0846   VLDL 31.0 02/27/2015 0846   LDLCALC 78 02/27/2015 0846     Other  studies Reviewed: Additional studies/ records that were reviewed today with results demonstrating: Cath records reviewed.   ASSESSMENT AND PLAN:  1. CAD: Doing well after CTO PCI of RCA. He denies further angina. Continue current Rx with ASA, Plavix, statin, beta blocker.Exercises 3-5 days/week on the treadmill.  He has gained 30 lbs since his last viist.   2. Hyperlipidemia: LDL 78 in 2016.  WIll recheck lipids today.  He would be willing to take a PCS K9 inhibitor if needed. 3. HTN:  Good control. He notes his BP goes up in the MD office. He  monitors closely at home and BPs have been optimal. Controlled today here in the office. COntinue current meds.  4. PVCs: No sx of PVCs recently.  He had 8% PVCs on Holter in 2015. 5. Obesity: We spoke about the importance of trying to keep his weight down.   Current medicines are reviewed at length with the patient today.  The patient concerns regarding his medicines were addressed.  The following changes have been made:  No change  Labs/ tests ordered today include:   Orders Placed This Encounter  Procedures  . EKG 12-Lead    Recommend 150 minutes/week of aerobic exercise Low fat, low carb, high fiber diet recommended  Disposition:   FU in 1 year   Signed, Lance Muss, MD  02/15/2016 11:11 AM    [facility] [address] [place], [place], [place]  [address] Phone: [phone]; Fax: [phone]

## 2016-02-22 ENCOUNTER — Other Ambulatory Visit: Payer: Self-pay | Admitting: *Deleted

## 2016-02-22 ENCOUNTER — Other Ambulatory Visit: Payer: Self-pay | Admitting: Interventional Cardiology

## 2016-02-22 DIAGNOSIS — Z79899 Other long term (current) drug therapy: Secondary | ICD-10-CM

## 2016-02-22 DIAGNOSIS — E782 Mixed hyperlipidemia: Secondary | ICD-10-CM

## 2016-02-22 MED ORDER — EZETIMIBE 10 MG PO TABS: 10.0000 mg | ORAL_TABLET | Freq: Every day | ORAL | Status: DC

## 2016-03-01 ENCOUNTER — Other Ambulatory Visit: Payer: Self-pay | Admitting: Interventional Cardiology

## 2016-04-19 ENCOUNTER — Other Ambulatory Visit: Payer: Self-pay

## 2016-04-19 MED ORDER — CLOPIDOGREL BISULFATE 75 MG PO TABS: 75.0000 mg | ORAL_TABLET | Freq: Every day | ORAL | 11 refills | Status: DC

## 2016-05-09 ENCOUNTER — Other Ambulatory Visit: Payer: Self-pay | Admitting: Interventional Cardiology

## 2016-06-01 ENCOUNTER — Ambulatory Visit (INDEPENDENT_AMBULATORY_CARE_PROVIDER_SITE_OTHER): Payer: Managed Care, Other (non HMO) | Admitting: Physician Assistant

## 2016-06-01 VITALS — BP 142/84 | HR 64 | Temp 99.8°F | Resp 18 | Ht 67.0 in | Wt 195.4 lb

## 2016-06-01 DIAGNOSIS — J069 Acute upper respiratory infection, unspecified: Secondary | ICD-10-CM

## 2016-06-01 MED ORDER — DOXYCYCLINE HYCLATE 100 MG PO CAPS: 100.0000 mg | ORAL_CAPSULE | Freq: Two times a day (BID) | ORAL | 0 refills | Status: AC

## 2016-06-01 MED ORDER — HYDROCODONE-HOMATROPINE 5-1.5 MG/5ML PO SYRP: 2.5000 mL | ORAL_SOLUTION | Freq: Every day | ORAL | 0 refills | Status: AC

## 2016-06-01 MED ORDER — DOXYCYCLINE HYCLATE 100 MG PO CAPS: 100.0000 mg | ORAL_CAPSULE | Freq: Two times a day (BID) | ORAL | 0 refills | Status: DC

## 2016-06-01 NOTE — Patient Instructions (Signed)
     IF you received an x-ray today, you will receive an invoice from [facility]. Please contact [facility] at [phone] with questions or concerns regarding your invoice.   IF you received labwork today, you will receive an invoice from [facility]. Please contact [facility] at [phone] with questions or concerns regarding your invoice.   Our billing staff will not be able to assist you with questions regarding bills from these companies.  You will be contacted with the lab results as soon as they are available. The fastest way to get your results is to activate your My Chart account. Instructions are located on the last page of this paperwork. If you have not heard from us regarding the results in 2 weeks, please contact this office.      

## 2016-06-01 NOTE — Progress Notes (Signed)
06/01/2016 2:27 PM   DOB: 08/20/1956 / MRN: [id number]  SUBJECTIVE:  Bobby Munoz is a 60 y.o. male presenting for myalgia and fatigue that starte 4 days ago.  This was followed by sore throat and cough.  He also complains of nasal congestion.  Denies chest pain, SOB, leg swelling, dizziness. Never smoker.  Denies a history of asthma.     He is allergic to latex.   He  has a past medical history of Allergic to cats; Arthritis; Coronary artery disease; GERD (gastroesophageal reflux disease); High cholesterol; Hypertension; LBBB (left bundle branch block) (2010); Other and unspecified angina pectoris; Pneumonia (X 1); and Seasonal allergies.    He  reports that he has never smoked. He has never used smokeless tobacco. He reports that he drinks alcohol. He reports that he does not use drugs. He  reports that he currently engages in sexual activity. The patient  has a past surgical history that includes Tonsillectomy; Cardiovascular stress test (2011); Cardiac catheterization (~ 2000; 03/08/2014); Coronary angioplasty with stent (03/09/2014; 04/20/2014); Cardiac catheterization (04/20/2014); left heart catheterization with coronary angiogram (N/A, 03/08/2014); percutaneous coronary stent intervention (pci-s) (N/A, 03/09/2014); percutaneous coronary stent intervention (pci-s) (N/A, 04/20/2014); and Cardiac catheterization (04/20/2014).  His family history includes Healthy in his sister and sister; Heart attack in his father; Heart failure in his father; Hypertension in his mother.  ROS   PER HPI  The problem list and medications were reviewed and updated by myself where necessary and exist elsewhere in the encounter.   OBJECTIVE:  BP (!) 142/84   Pulse 64   Temp 99.8 F (37.7 C) (Oral)   Resp 18   Ht 5\' 7"  (1.702 m)   Wt 195 lb 6.4 oz (88.6 kg)   SpO2 97%   BMI 30.60 kg/m   Physical Exam  Constitutional: He is oriented to person, place, and time. He appears well-developed. He does not appear ill.   HENT:  Right Ear: Tympanic membrane normal.  Left Ear: Tympanic membrane normal.  Nose: Nose normal.  Mouth/Throat: Uvula is midline, oropharynx is clear and moist and mucous membranes are normal.  Eyes: Conjunctivae and EOM are normal. Pupils are equal, round, and reactive to light.  Cardiovascular: Normal rate and regular rhythm.   Pulmonary/Chest: Effort normal and breath sounds normal.  Abdominal: He exhibits no distension.  Musculoskeletal: Normal range of motion.  Neurological: He is alert and oriented to person, place, and time. No cranial nerve deficit. Coordination normal.  Skin: Skin is warm and dry. He is not diaphoretic.  Psychiatric: He has a normal mood and affect.  Nursing note and vitals reviewed.   No results found for this or any previous visit (from the past 72 hour(s)).  No results found.  ASSESSMENT AND PLAN  Brennen was seen today for sore throat and cough.  Diagnoses and all orders for this visit:  Acute URI: Likely viral.  Advised he hold the abx and fill only if he is worsening after day 10-12.   -     HYDROcodone-homatropine (HYCODAN) 5-1.5 MG/5ML syrup; Take 2.5-5 mLs by mouth at bedtime. -     doxycycline (VIBRAMYCIN) 100 MG capsule; Take 1 capsule (100 mg total) by mouth 2 (two) times daily.  Other orders -     Cancel: TSH -     Discontinue: doxycycline (VIBRAMYCIN) 100 MG capsule; Take 1 capsule (100 mg total) by mouth 2 (two) times daily.    The patient is advised to call or return  to clinic if he does not see an improvement in symptoms, or to seek the care of the closest emergency department if he worsens with the above plan.   Deliah BostonMichael Zarin Hagmann, MHS, PA-C [facility] 06/01/2016 2:27 PM

## 2016-06-10 ENCOUNTER — Other Ambulatory Visit: Payer: Self-pay | Admitting: Interventional Cardiology

## 2016-06-10 MED ORDER — EZETIMIBE 10 MG PO TABS: 10.0000 mg | ORAL_TABLET | Freq: Every day | ORAL | 8 refills | Status: DC

## 2017-02-12 ENCOUNTER — Other Ambulatory Visit: Payer: Self-pay | Admitting: Interventional Cardiology

## 2017-02-20 ENCOUNTER — Other Ambulatory Visit: Payer: Self-pay | Admitting: Interventional Cardiology

## 2017-03-16 ENCOUNTER — Other Ambulatory Visit: Payer: Self-pay | Admitting: Interventional Cardiology

## 2017-03-31 ENCOUNTER — Ambulatory Visit (INDEPENDENT_AMBULATORY_CARE_PROVIDER_SITE_OTHER): Payer: Managed Care, Other (non HMO) | Admitting: Interventional Cardiology

## 2017-03-31 ENCOUNTER — Encounter: Payer: Self-pay | Admitting: Interventional Cardiology

## 2017-03-31 VITALS — BP 154/90 | HR 57 | Ht 67.0 in | Wt 208.1 lb

## 2017-03-31 DIAGNOSIS — I2510 Atherosclerotic heart disease of native coronary artery without angina pectoris: Secondary | ICD-10-CM | POA: Diagnosis not present

## 2017-03-31 DIAGNOSIS — E782 Mixed hyperlipidemia: Secondary | ICD-10-CM | POA: Diagnosis not present

## 2017-03-31 DIAGNOSIS — I10 Essential (primary) hypertension: Secondary | ICD-10-CM | POA: Diagnosis not present

## 2017-03-31 DIAGNOSIS — I447 Left bundle-branch block, unspecified: Secondary | ICD-10-CM | POA: Diagnosis not present

## 2017-03-31 NOTE — Patient Instructions (Signed)
Medication Instructions:  Your physician recommends that you continue on your current medications as directed. Please refer to the Current Medication list given to you today.   Labwork: LABS TODAY: CMET, LIPIDS  Testing/Procedures: None ordered  Follow-Up: Your physician wants you to follow-up in: 1 year with Dr. Varanasi. You will receive a reminder letter in the mail two months in advance. If you don't receive a letter, please call our office to schedule the follow-up appointment.   Any Other Special Instructions Will Be Listed Below (If Applicable).     If you need a refill on your cardiac medications before your next appointment, please call your pharmacy.   

## 2017-03-31 NOTE — Progress Notes (Signed)
Cardiology Office Note   Date:  03/31/2017   ID:  Bobby Munoz, Bobby Munoz 03/24/1956, MRN [id number]  PCP:  Sigmund Hazel, MD    Chief Complaint  Patient presents with  . Follow-up  CAD   Wt Readings from Last 3 Encounters:  03/31/17 208 lb 1.9 oz (94.4 kg)  06/01/16 195 lb 6.4 oz (88.6 kg)  02/15/16 204 lb (92.5 kg)       History of Present Illness: Bobby Munoz is a 61 y.o. male  with a hx of 2 vessel CAD s/p PCI of the mid LAD with a DES in 02/2014 and CTO PCI of the RCA in 8/15.  Total of 6 DES, 2 in LAD and 4 in the RCA.  - LHC (6/15): Mid LAD 80%, RCA occluded, EF 50%, LVEDP 14 mmHg >>> PCI: overlapping DESs of 2.5 x 38 and 3.0 x 38 mm (Xience) to mid LAD. Attempted PCI of RCA unsuccessful.  - Successful PCI of CTO of mid RCA with 4 overlapping DES (04/20/14) - Nuclear (6/15): Inf scar with peri-infarct ischemia, EF 39%; INTERMEDIATE RISK  Denies : Chest pain. Dizziness. Leg edema. Nitroglycerin use. Orthopnea. Palpitations. Paroxysmal nocturnal dyspnea. Shortness of breath. Syncope.   He has gained weight since the last visit. He does go to the gym 3-4 times a week.  No resistance training.      Past Medical History:  Diagnosis Date  . Allergic to cats   . Arthritis    "left shoulder" (04/20/2014)  . Coronary artery disease   . GERD (gastroesophageal reflux disease)   . High cholesterol   . Hypertension   . LBBB (left bundle branch block) 2010  . Other and unspecified angina pectoris   . Pneumonia X 1  . Seasonal allergies    "fall and spring"    Past Surgical History:  Procedure Laterality Date  . CARDIAC CATHETERIZATION  ~ 2000; 03/08/2014  . CARDIAC CATHETERIZATION  04/20/2014   PCI of a chronic total occlusion in the mid right coronary artery.  There were 4 overlapping stents placed   . CARDIAC CATHETERIZATION  04/20/2014   Procedure: LEFT HEART CATH;  Surgeon: Corky Crafts, MD;  Location: [facility] CATH LAB;  Service: Cardiovascular;;  .  CARDIOVASCULAR STRESS TEST  2011  . CORONARY ANGIOPLASTY WITH STENT PLACEMENT  03/09/2014; 04/20/2014   "2 + 4"  . LEFT HEART CATHETERIZATION WITH CORONARY ANGIOGRAM N/A 03/08/2014   Procedure: LEFT HEART CATHETERIZATION WITH CORONARY ANGIOGRAM;  Surgeon: Corky Crafts, MD;  Location: [facility] CATH LAB;  Service: Cardiovascular;  Laterality: N/A;  . PERCUTANEOUS CORONARY STENT INTERVENTION (PCI-S) N/A 03/09/2014   Procedure: PERCUTANEOUS CORONARY STENT INTERVENTION (PCI-S);  Surgeon: Corky Crafts, MD;  Location: [facility] CATH LAB;  Service: Cardiovascular;  Laterality: N/A;  . PERCUTANEOUS CORONARY STENT INTERVENTION (PCI-S) N/A 04/20/2014   Procedure: PERCUTANEOUS CORONARY STENT INTERVENTION (PCI-S);  Surgeon: Corky Crafts, MD;  Location: [facility] CATH LAB;  Service: Cardiovascular;  Laterality: N/A;  . TONSILLECTOMY       Current Outpatient Prescriptions  Medication Sig Dispense Refill  . amLODipine (NORVASC) 5 MG tablet TAKE 1 TABLET BY MOUTH DAILY 30 tablet 1  . atorvastatin (LIPITOR) 80 MG tablet TAKE 1 TABLET BY MOUTH EVERY DAY 30 tablet 0  . citalopram (CELEXA) 40 MG tablet Take 40 mg by mouth daily.    . clopidogrel (PLAVIX) 75 MG tablet Take 1 tablet (75 mg total) by mouth daily. KEEP OV. 30 tablet 11  .  Coenzyme Q10 (COQ10 PO) Take 1 capsule by mouth daily.    . Cyanocobalamin (B-12 PO) Take 1 tablet by mouth daily.    Marland Kitchen. ezetimibe (ZETIA) 10 MG tablet TAKE 1 TABLET BY MOUTH DAILY 30 tablet 0  . losartan (COZAAR) 100 MG tablet TAKE 1/2 TABLET BY MOUTH TWICE DAILY 30 tablet 1  . metoprolol tartrate (LOPRESSOR) 25 MG tablet TAKE 1 TABLET(25 MG) BY MOUTH TWICE DAILY 60 tablet 1  . nitroGLYCERIN (NITROSTAT) 0.4 MG SL tablet Place 1 tablet (0.4 mg total) under the tongue every 5 (five) minutes as needed for chest pain (X3 DOSES BEFORE CALLING 911). 25 tablet 3  . Omega-3 Fatty Acids (FISH OIL) 1000 MG CPDR Take 1,000 mg by mouth 2 (two) times daily.     . pantoprazole (PROTONIX) 40 MG tablet  TAKE 1 TABLET(40 MG) BY MOUTH DAILY 30 tablet 1   No current facility-administered medications for this visit.     Allergies:   Latex    Social History:  The patient  reports that he has never smoked. He has never used smokeless tobacco. He reports that he drinks alcohol. He reports that he does not use drugs.   Family History:  The patient's family history includes Healthy in his sister and sister; Heart attack in his father; Heart failure in his father; Hypertension in his mother.    ROS:  Please see the history of present illness.   Otherwise, review of systems are positive for weight gain.   All other systems are reviewed and negative.    PHYSICAL EXAM: VS:  BP (!) 154/90   Pulse (!) 57   Ht 5\' 7"  (1.702 m)   Wt 208 lb 1.9 oz (94.4 kg)   BMI 32.60 kg/m  , BMI Body mass index is 32.6 kg/m. GEN: Well nourished, well developed, in no acute distress  HEENT: normal  Neck: no JVD, carotid bruits, or masses Cardiac: RRR; no murmurs, rubs, or gallops,no edema  Respiratory:  clear to auscultation bilaterally, normal work of breathing GI: soft, nontender, nondistended, + BS MS: no deformity or atrophy  Skin: warm and dry, no rash Neuro:  Strength and sensation are intact Psych: euthymic mood, full affect   EKG:   The ekg ordered today demonstrates NSR, LBBB- no change from 2017   Recent Labs: No results found for requested labs within last 8760 hours.   Lipid Panel    Component Value Date/Time   CHOL 158 02/15/2016 1119   TRIG 157 (H) 02/15/2016 1119   HDL 46 02/15/2016 1119   CHOLHDL 3.4 02/15/2016 1119   VLDL 31 (H) 02/15/2016 1119   LDLCALC 81 02/15/2016 1119     Other studies Reviewed: Additional studies/ records that were reviewed today with results demonstrating: cath results.   ASSESSMENT AND PLAN:  1. CAD: No angina on medical therapy.  COntinue current medical therapy. Continue regular exercise. We spoke about trying to incorporate some weight training  in to help with weight loss.  2. Hyperlipidemia: Lipids controlled in 2017. Will recheck today. 3. HTN: Slightly increased.  He needs to check blood pressure outside of the doctor's office. This should also improve with some weight loss. There is blood pressure does improve, could stop beta blocker. He is interested in stopping some of his medicines. 4. He has had PVCs in the past. He has had 8% PVCs on Holter monitor in 2015. 5. LBBB: Chronic.   Current medicines are reviewed at length with the patient today.  The patient concerns regarding his medicines were addressed.  The following changes have been made:  No change  Labs/ tests ordered today include:  No orders of the defined types were placed in this encounter.   Recommend 150 minutes/week of aerobic exercise Low fat, low carb, high fiber diet recommended  Disposition:   FU in 1 year   Signed, Lance Muss, MD  03/31/2017 11:11 AM    [facility] [address] [place], [place], [place]  [address] Phone: [phone]; Fax: [phone]

## 2017-04-01 LAB — COMPREHENSIVE METABOLIC PANEL
ALBUMIN: 4.3 g/dL (ref 3.6–4.8)
ALT: 29 IU/L (ref 0–44)
AST: 26 IU/L (ref 0–40)
Albumin/Globulin Ratio: 1.6 (ref 1.2–2.2)
Alkaline Phosphatase: 55 IU/L (ref 39–117)
BUN / CREAT RATIO: 17 (ref 10–24)
BUN: 14 mg/dL (ref 8–27)
Bilirubin Total: 1.2 mg/dL (ref 0.0–1.2)
CO2: 20 mmol/L (ref 20–29)
Calcium: 9.3 mg/dL (ref 8.6–10.2)
Chloride: 99 mmol/L (ref 96–106)
Creatinine, Ser: 0.84 mg/dL (ref 0.76–1.27)
GFR, EST AFRICAN AMERICAN: 109 mL/min/{1.73_m2} (ref >59)
GFR, EST NON AFRICAN AMERICAN: 95 mL/min/{1.73_m2} (ref >59)
GLOBULIN, TOTAL: 2.7 g/dL (ref 1.5–4.5)
GLUCOSE: 102 mg/dL — AB (ref 65–99)
Potassium: 5 mmol/L (ref 3.5–5.2)
SODIUM: 138 mmol/L (ref 134–144)
TOTAL PROTEIN: 7 g/dL (ref 6.0–8.5)

## 2017-04-01 LAB — LIPID PANEL
CHOL/HDL RATIO: 2.9 ratio (ref 0.0–5.0)
Cholesterol, Total: 149 mg/dL (ref 100–199)
HDL: 51 mg/dL (ref >39)
LDL CALC: 63 mg/dL (ref 0–99)
Triglycerides: 173 mg/dL — ABNORMAL HIGH (ref 0–149)
VLDL CHOLESTEROL CAL: 35 mg/dL (ref 5–40)

## 2017-05-18 ENCOUNTER — Other Ambulatory Visit: Payer: Self-pay | Admitting: Interventional Cardiology

## 2017-06-19 ENCOUNTER — Other Ambulatory Visit: Payer: Self-pay | Admitting: Interventional Cardiology

## 2017-07-06 ENCOUNTER — Other Ambulatory Visit: Payer: Self-pay | Admitting: Interventional Cardiology

## 2017-12-11 ENCOUNTER — Other Ambulatory Visit: Payer: Self-pay | Admitting: Interventional Cardiology

## 2017-12-27 ENCOUNTER — Observation Stay (HOSPITAL_COMMUNITY)
Admission: EM | Admit: 2017-12-27 | Discharge: 2017-12-29 | Disposition: A | Discharge status: Home / Self Care | Payer: Managed Care, Other (non HMO) | Attending: Internal Medicine | Admitting: Internal Medicine

## 2017-12-27 ENCOUNTER — Encounter (HOSPITAL_COMMUNITY): Payer: Self-pay | Admitting: Emergency Medicine

## 2017-12-27 DIAGNOSIS — I447 Left bundle-branch block, unspecified: Secondary | ICD-10-CM | POA: Insufficient documentation

## 2017-12-27 DIAGNOSIS — K219 Gastro-esophageal reflux disease without esophagitis: Secondary | ICD-10-CM | POA: Diagnosis not present

## 2017-12-27 DIAGNOSIS — Z8249 Family history of ischemic heart disease and other diseases of the circulatory system: Secondary | ICD-10-CM | POA: Diagnosis not present

## 2017-12-27 DIAGNOSIS — I152 Hypertension secondary to endocrine disorders: Secondary | ICD-10-CM | POA: Diagnosis present

## 2017-12-27 DIAGNOSIS — Z6830 Body mass index (BMI) 30.0-30.9, adult: Secondary | ICD-10-CM | POA: Diagnosis not present

## 2017-12-27 DIAGNOSIS — E1159 Type 2 diabetes mellitus with other circulatory complications: Secondary | ICD-10-CM | POA: Diagnosis present

## 2017-12-27 DIAGNOSIS — Z7902 Long term (current) use of antithrombotics/antiplatelets: Secondary | ICD-10-CM | POA: Diagnosis not present

## 2017-12-27 DIAGNOSIS — Z955 Presence of coronary angioplasty implant and graft: Secondary | ICD-10-CM | POA: Diagnosis not present

## 2017-12-27 DIAGNOSIS — M19012 Primary osteoarthritis, left shoulder: Secondary | ICD-10-CM | POA: Diagnosis not present

## 2017-12-27 DIAGNOSIS — R001 Bradycardia, unspecified: Secondary | ICD-10-CM | POA: Diagnosis not present

## 2017-12-27 DIAGNOSIS — Z79899 Other long term (current) drug therapy: Secondary | ICD-10-CM | POA: Insufficient documentation

## 2017-12-27 DIAGNOSIS — Z91040 Latex allergy status: Secondary | ICD-10-CM | POA: Diagnosis not present

## 2017-12-27 DIAGNOSIS — I959 Hypotension, unspecified: Secondary | ICD-10-CM | POA: Diagnosis not present

## 2017-12-27 DIAGNOSIS — E782 Mixed hyperlipidemia: Secondary | ICD-10-CM | POA: Diagnosis not present

## 2017-12-27 DIAGNOSIS — E7800 Pure hypercholesterolemia, unspecified: Secondary | ICD-10-CM | POA: Insufficient documentation

## 2017-12-27 DIAGNOSIS — E669 Obesity, unspecified: Secondary | ICD-10-CM | POA: Diagnosis not present

## 2017-12-27 DIAGNOSIS — R55 Syncope and collapse: Secondary | ICD-10-CM | POA: Diagnosis not present

## 2017-12-27 DIAGNOSIS — E785 Hyperlipidemia, unspecified: Secondary | ICD-10-CM | POA: Insufficient documentation

## 2017-12-27 DIAGNOSIS — R6510 Systemic inflammatory response syndrome (SIRS) of non-infectious origin without acute organ dysfunction: Secondary | ICD-10-CM

## 2017-12-27 DIAGNOSIS — I2510 Atherosclerotic heart disease of native coronary artery without angina pectoris: Secondary | ICD-10-CM | POA: Diagnosis not present

## 2017-12-27 DIAGNOSIS — I10 Essential (primary) hypertension: Secondary | ICD-10-CM | POA: Insufficient documentation

## 2017-12-27 DIAGNOSIS — Z87898 Personal history of other specified conditions: Secondary | ICD-10-CM

## 2017-12-27 DIAGNOSIS — R61 Generalized hyperhidrosis: Secondary | ICD-10-CM | POA: Diagnosis not present

## 2017-12-27 HISTORY — DX: Personal history of other specified conditions: Z87.898

## 2017-12-27 LAB — BASIC METABOLIC PANEL
ANION GAP: 11 (ref 5–15)
BUN: 19 mg/dL (ref 6–20)
CO2: 22 mmol/L (ref 22–32)
Calcium: 8.9 mg/dL (ref 8.9–10.3)
Chloride: 106 mmol/L (ref 101–111)
Creatinine, Ser: 1.08 mg/dL (ref 0.61–1.24)
GFR calc Af Amer: >60 mL/min (ref >60)
GFR calc non Af Amer: >60 mL/min (ref >60)
GLUCOSE: 99 mg/dL (ref 65–99)
Potassium: 4 mmol/L (ref 3.5–5.1)
Sodium: 139 mmol/L (ref 135–145)

## 2017-12-27 LAB — I-STAT TROPONIN, ED: Troponin i, poc: 0 ng/mL (ref 0.00–0.08)

## 2017-12-27 LAB — CBC
HCT: 42.4 % (ref 39.0–52.0)
HEMOGLOBIN: 14.4 g/dL (ref 13.0–17.0)
MCH: 32.5 pg (ref 26.0–34.0)
MCHC: 34 g/dL (ref 30.0–36.0)
MCV: 95.7 fL (ref 78.0–100.0)
Platelets: 195 10*3/uL (ref 150–400)
RBC: 4.43 MIL/uL (ref 4.22–5.81)
RDW: 12.2 % (ref 11.5–15.5)
WBC: 11.2 10*3/uL — ABNORMAL HIGH (ref 4.0–10.5)

## 2017-12-27 MED ORDER — ONDANSETRON HCL 4 MG/2ML IJ SOLN: 4.0000 mg | Freq: Four times a day (QID) | INTRAMUSCULAR | Status: DC | PRN

## 2017-12-27 MED ORDER — SODIUM CHLORIDE 0.9% FLUSH
3.0000 mL | Freq: Two times a day (BID) | INTRAVENOUS | Status: DC
  Administered 2017-12-28 (×2): 3 mL via INTRAVENOUS

## 2017-12-27 MED ORDER — ACETAMINOPHEN 325 MG PO TABS: 650.0000 mg | ORAL_TABLET | Freq: Four times a day (QID) | ORAL | Status: DC | PRN

## 2017-12-27 MED ORDER — CITALOPRAM HYDROBROMIDE 20 MG PO TABS
40.0000 mg | ORAL_TABLET | Freq: Every day | ORAL | Status: DC
  Administered 2017-12-28 – 2017-12-29 (×2): 40 mg via ORAL
  Filled 2017-12-27 (×2): qty 2

## 2017-12-27 MED ORDER — ENOXAPARIN SODIUM 40 MG/0.4ML ~~LOC~~ SOLN
40.0000 mg | Freq: Every day | SUBCUTANEOUS | Status: DC
  Administered 2017-12-28: 40 mg via SUBCUTANEOUS
  Filled 2017-12-27: qty 0.4

## 2017-12-27 MED ORDER — SENNOSIDES-DOCUSATE SODIUM 8.6-50 MG PO TABS: 1.0000 | ORAL_TABLET | Freq: Every evening | ORAL | Status: DC | PRN

## 2017-12-27 MED ORDER — ATORVASTATIN CALCIUM 80 MG PO TABS
80.0000 mg | ORAL_TABLET | Freq: Every day | ORAL | Status: DC
  Administered 2017-12-28: 80 mg via ORAL
  Filled 2017-12-27: qty 1

## 2017-12-27 MED ORDER — ONDANSETRON HCL 4 MG PO TABS: 4.0000 mg | ORAL_TABLET | Freq: Four times a day (QID) | ORAL | Status: DC | PRN

## 2017-12-27 MED ORDER — SODIUM CHLORIDE 0.9 % IV BOLUS
1000.0000 mL | Freq: Once | INTRAVENOUS | Status: AC
  Administered 2017-12-27: 1000 mL via INTRAVENOUS

## 2017-12-27 MED ORDER — ACETAMINOPHEN 650 MG RE SUPP: 650.0000 mg | Freq: Four times a day (QID) | RECTAL | Status: DC | PRN

## 2017-12-27 MED ORDER — SODIUM CHLORIDE 0.9 % IV SOLN
INTRAVENOUS | Status: AC
  Administered 2017-12-28 (×2): via INTRAVENOUS

## 2017-12-27 MED ORDER — EZETIMIBE 10 MG PO TABS
10.0000 mg | ORAL_TABLET | Freq: Every day | ORAL | Status: DC
  Administered 2017-12-28 – 2017-12-29 (×2): 10 mg via ORAL
  Filled 2017-12-27 (×2): qty 1

## 2017-12-27 MED ORDER — BISACODYL 5 MG PO TBEC: 5.0000 mg | DELAYED_RELEASE_TABLET | Freq: Every day | ORAL | Status: DC | PRN

## 2017-12-27 MED ORDER — SODIUM CHLORIDE 0.9 % IV BOLUS
500.0000 mL | Freq: Once | INTRAVENOUS | Status: AC
  Administered 2017-12-27: 500 mL via INTRAVENOUS

## 2017-12-27 MED ORDER — HYDROCODONE-ACETAMINOPHEN 5-325 MG PO TABS: 1.0000 | ORAL_TABLET | ORAL | Status: DC | PRN

## 2017-12-27 MED ORDER — CLOPIDOGREL BISULFATE 75 MG PO TABS
75.0000 mg | ORAL_TABLET | Freq: Every day | ORAL | Status: DC
  Administered 2017-12-28 – 2017-12-29 (×2): 75 mg via ORAL
  Filled 2017-12-27 (×2): qty 1

## 2017-12-27 MED ORDER — PANTOPRAZOLE SODIUM 40 MG PO TBEC
40.0000 mg | DELAYED_RELEASE_TABLET | Freq: Every day | ORAL | Status: DC
  Administered 2017-12-28 – 2017-12-29 (×2): 40 mg via ORAL
  Filled 2017-12-27 (×2): qty 1

## 2017-12-27 NOTE — ED Provider Notes (Signed)
[facility] EMERGENCY DEPARTMENT Provider Note   CSN: [id number] Arrival date & time: 12/27/17  2028     History   Chief Complaint Chief Complaint  Patient presents with  . Loss of Consciousness    HPI Bobby Munoz is a 62 y.o. male.  Patient with history of left bundle branch block, high blood pressure, 6 cardiac stents follows with local cardiologist reflux presents after syncopal episode in general shaking lasting 2 minutes.no history of similar. Patient was seated and then walked for 5 steps and suddenly passed out. Patient denies feeling lightheaded. Patient does feel dehydrated and has not been drinking regular liquids today. No chest pain or shortness of breath. No recent surgeries or blood clot history.nonsmoker.     Past Medical History:  Diagnosis Date  . Allergic to cats   . Arthritis    "left shoulder" (04/20/2014)  . Coronary artery disease   . GERD (gastroesophageal reflux disease)   . High cholesterol   . Hypertension   . LBBB (left bundle branch block) 2010  . Other and unspecified angina pectoris   . Pneumonia X 1  . Seasonal allergies    "fall and spring"    Patient Active Problem List   Diagnosis Date Noted  . Syncope 12/27/2017  . Obesity 02/15/2016  . PVC's (premature ventricular contractions) 06/30/2014  . Atherosclerosis of native coronary artery of native heart with angina pectoris (HCC) 04/13/2014  . Coronary artery disease 03/21/2014  . Ventricular bigeminy 03/10/2014    Class: Acute  . Nonspecific abnormal unspecified cardiovascular function study 03/09/2014  . Other and unspecified angina pectoris   . LBBB (left bundle branch block) 02/14/2014  . Mixed hyperlipidemia 02/14/2014  . HTN (hypertension) 09/25/2012    Past Surgical History:  Procedure Laterality Date  . CARDIAC CATHETERIZATION  ~ 2000; 03/08/2014  . CARDIAC CATHETERIZATION  04/20/2014   PCI of a chronic total occlusion in the mid right coronary artery.   There were 4 overlapping stents placed   . CARDIAC CATHETERIZATION  04/20/2014   Procedure: LEFT HEART CATH;  Surgeon: Corky Crafts, MD;  Location[facility] CATH LAB;  Service: Cardiovascular;;  . CARDIOVASCULAR STRESS TEST  2011  . CORONARY ANGIOPLASTY WITH STENT PLACEMENT  03/09/2014; 04/20/2014   "2 + 4"  . LEFT HEART CATHETERIZATION WITH CORONARY ANGIOGRAM N/A 03/08/2014   Procedure: LEFT HEART CATHETERIZATION WITH CORONARY ANGIOGRAM;  Surgeon: Corky Crafts, MD;  Location: [facility] CATH LAB;  Service: Cardiovascular;  Laterality: N/A;  . PERCUTANEOUS CORONARY STENT INTERVENTION (PCI-S) N/A 03/09/2014   Procedure: PERCUTANEOUS CORONARY STENT INTERVENTION (PCI-S);  Surgeon: Corky Crafts, MD;  Location: [facility] CATH LAB;  Service: Cardiovascular;  Laterality: N/A;  . PERCUTANEOUS CORONARY STENT INTERVENTION (PCI-S) N/A 04/20/2014   Procedure: PERCUTANEOUS CORONARY STENT INTERVENTION (PCI-S);  Surgeon: Corky Crafts, MD;  Location: [facility] CATH LAB;  Service: Cardiovascular;  Laterality: N/A;  . TONSILLECTOMY          Home Medications    Prior to Admission medications   Medication Sig Start Date End Date Taking? Authorizing Provider  amLODipine (NORVASC) 5 MG tablet TAKE 1 TABLET BY MOUTH DAILY Patient taking differently: TAKE 1 TABLET (5 MG) BY MOUTH DAILY 07/07/17  Yes Corky Crafts, MD  atorvastatin (LIPITOR) 80 MG tablet Take 1 tablet (80 mg total) by mouth daily. Patient taking differently: Take 80 mg by mouth at bedtime.  06/20/17  Yes Corky Crafts, MD  citalopram (CELEXA) 40 MG tablet Take 40  mg by mouth daily.   Yes [provider]  clopidogrel (PLAVIX) 75 MG tablet Take 1 tablet (75 mg total) by mouth daily. Please make yearly appt with Dr. Eldridge DaceVaranasi for July. 1st attempt 12/11/17  Yes Corky CraftsVaranasi, Jayadeep S, MD  ezetimibe (ZETIA) 10 MG tablet TAKE 1 TABLET BY MOUTH EVERY DAY. MUST BE SEEN FOR FUTURE REFILLS Patient taking differently: TAKE 1 TABLET (10 MG)  BY  MOUTH EVERY DAY. MUST BE SEEN FOR FUTURE REFILLS 06/20/17  Yes Corky CraftsVaranasi, Jayadeep S, MD  ibuprofen (ADVIL,MOTRIN) 200 MG tablet Take 400 mg by mouth every 6 (six) hours as needed for headache (pain).   Yes [provider]  loratadine (CLARITIN) 10 MG tablet Take 10 mg by mouth at bedtime.   Yes [provider]  losartan (COZAAR) 100 MG tablet TAKE 1/2 TABLET BY MOUTH TWICE DAILY Patient taking differently: TAKE 1/2 TABLET (50 MG) BY MOUTH TWICE DAILY 06/20/17  Yes Corky CraftsVaranasi, Jayadeep S, MD  metoprolol tartrate (LOPRESSOR) 25 MG tablet TAKE 1 TABLET(25 MG) BY MOUTH TWICE DAILY 06/20/17  Yes Corky CraftsVaranasi, Jayadeep S, MD  nitroGLYCERIN (NITROSTAT) 0.4 MG SL tablet Place 1 tablet (0.4 mg total) under the tongue every 5 (five) minutes as needed for chest pain (X3 DOSES BEFORE CALLING 911). 02/15/16  Yes Corky CraftsVaranasi, Jayadeep S, MD  pantoprazole (PROTONIX) 40 MG tablet TAKE 1 TABLET(40 MG) BY MOUTH DAILY 02/20/17  Yes Corky CraftsVaranasi, Jayadeep S, MD    Family History Family History  Problem Relation Age of Onset  . Heart failure Father   . Heart attack Father   . Hypertension Mother   . Healthy Sister   . Healthy Sister     Social History Social History   Tobacco Use  . Smoking status: Never Smoker  . Smokeless tobacco: Never Used  Substance Use Topics  . Alcohol use: Yes    Comment: 04/20/2014 "used to have couple glasses of wine/wk; none since 02/2014"  . Drug use: No     Allergies   Shellfish allergy and Latex   Review of Systems Review of Systems  Constitutional: Negative for chills and fever.  HENT: Negative for congestion.   Eyes: Negative for visual disturbance.  Respiratory: Negative for shortness of breath.   Cardiovascular: Negative for chest pain.  Gastrointestinal: Negative for abdominal pain and vomiting.  Genitourinary: Negative for dysuria and flank pain.  Musculoskeletal: Negative for back pain, neck pain and neck stiffness.  Skin: Negative for rash.  Neurological:  Positive for seizures and syncope. Negative for light-headedness and headaches.     Physical Exam Updated Vital Signs BP 104/69   Pulse 64   Temp (!) 97 F (36.1 C) (Temporal)   Resp (!) 21   Ht 5\' 7"  (1.702 m)   Wt 87.1 kg (192 lb)   SpO2 98%   BMI 30.07 kg/m   Physical Exam  Constitutional: He is oriented to person, place, and time. He appears well-developed and well-nourished.  HENT:  Head: Normocephalic and atraumatic.  Dry mucous membranes  Eyes: Conjunctivae are normal. Right eye exhibits no discharge. Left eye exhibits no discharge.  Neck: Normal range of motion. Neck supple. No tracheal deviation present.  Cardiovascular: Normal rate and regular rhythm.  Pulmonary/Chest: Effort normal and breath sounds normal.  Abdominal: Soft. He exhibits no distension. There is no tenderness. There is no guarding.  Musculoskeletal: He exhibits no edema.  Neurological: He is alert and oriented to person, place, and time. He has normal strength. No cranial nerve deficit or  sensory deficit. GCS eye subscore is 4. GCS verbal subscore is 5. GCS motor subscore is 6.  Skin: Skin is warm. No rash noted.  Psychiatric: He has a normal mood and affect.  Nursing note and vitals reviewed.    ED Treatments / Results  Labs (all labs ordered are listed, but only abnormal results are displayed) Labs Reviewed  CBC - Abnormal; Notable for the following components:      Result Value   WBC 11.2 (*)    All other components within normal limits  BASIC METABOLIC PANEL  URINALYSIS, ROUTINE W REFLEX MICROSCOPIC  I-STAT TROPONIN, ED  CBG MONITORING, ED    EKG EKG Interpretation  Date/Time:  Saturday December 27 2017 20:33:01 EDT Ventricular Rate:  60 PR Interval:    QRS Duration: 147 QT Interval:  479 QTC Calculation: 479 R Axis:   -21 Text Interpretation:  Atrial fibrillation Left bundle branch block Confirmed by Blane Ohara [phone]) on 12/27/2017 11:06:19 PM   Radiology No results  found.  Procedures Procedures (including critical care time)  Medications Ordered in ED Medications  sodium chloride 0.9 % bolus 1,000 mL (1,000 mLs Intravenous New Bag/Given 12/27/17 2316)  sodium chloride 0.9 % bolus 500 mL (0 mLs Intravenous Stopped 12/27/17 2310)     Initial Impression / Assessment and Plan / ED Course  I have reviewed the triage vital signs and the nursing notes.  Pertinent labs & imaging results that were available during my care of the patient were reviewed by me and considered in my medical decision making (see chart for details).    Patient presents after syncopal episode with 1-2 minutes of myoclonic jerking generalized. With cardiac history and new event plan for observation/telemetry for further workup by the hospitalist. Screen blood work unremarkable. Patient feels improved and blood pressure improved with IV fluid bolus and oral fluids. Discussed with chart hospital for admission.  The patients results and plan were reviewed and discussed.   Any x-rays performed were independently reviewed by myself.   Differential diagnosis were considered with the presenting HPI.  Medications  sodium chloride 0.9 % bolus 1,000 mL (1,000 mLs Intravenous New Bag/Given 12/27/17 2316)  atorvastatin (LIPITOR) tablet 80 mg (has no administration in time range)  citalopram (CELEXA) tablet 40 mg (has no administration in time range)  clopidogrel (PLAVIX) tablet 75 mg (has no administration in time range)  ezetimibe (ZETIA) tablet 10 mg (has no administration in time range)  pantoprazole (PROTONIX) EC tablet 40 mg (has no administration in time range)  sodium chloride flush (NS) 0.9 % injection 3 mL (has no administration in time range)  enoxaparin (LOVENOX) injection 40 mg (has no administration in time range)  0.9 %  sodium chloride infusion (has no administration in time range)  acetaminophen (TYLENOL) tablet 650 mg (has no administration in time range)    Or   acetaminophen (TYLENOL) suppository 650 mg (has no administration in time range)  HYDROcodone-acetaminophen (NORCO/VICODIN) 5-325 MG per tablet 1-2 tablet (has no administration in time range)  ondansetron (ZOFRAN) tablet 4 mg (has no administration in time range)    Or  ondansetron (ZOFRAN) injection 4 mg (has no administration in time range)  senna-docusate (Senokot-S) tablet 1 tablet (has no administration in time range)  bisacodyl (DULCOLAX) EC tablet 5 mg (has no administration in time range)  sodium chloride 0.9 % bolus 500 mL (0 mLs Intravenous Stopped 12/27/17 2310)    Vitals:   12/27/17 2245 12/27/17 2300 12/27/17 2315 12/27/17 2345  BP: 109/72 115/75 104/69 118/74  Pulse: 70 60 64 65  Resp: 15 (!) 9 (!) 21 19  Temp:      TempSrc:      SpO2: 95% 98% 98% 96%  Weight:      Height:        Final diagnoses:  Syncope  SIRS (systemic inflammatory response syndrome) (HCC)    Admission/ observation were discussed with the admitting physician, patient and/or family and they are comfortable with the plan.   Final Clinical Impressions(s) / ED Diagnoses   Final diagnoses:  None    ED Discharge Orders    None       Blane Ohara, MD 12/27/17 2353

## 2017-12-27 NOTE — ED Triage Notes (Signed)
Pt BIB [facility] for syncope. Pt had two syncopal episodes prior to EMS arrival. One from standing and one while sitting. Pt denies injury. Has history of 6 stents. Pt did endorse dizziness earlier while trying to stand

## 2017-12-27 NOTE — H&P (Signed)
History and Physical    Bobby Munoz EAV:[id number] DOB: [birth year] DOA: 12/27/2017  PCP: Sigmund Hazel, MD   Patient coming from: Home  Chief Complaint: Syncope   HPI: Bobby Munoz is a 62 y.o. male with medical history significant for coronary artery disease with stents, hypertension, chronic left bundle branch block, and hyperlipidemia, now presenting to the emergency department after syncope at home.  Patient reports that he woke in his usual state of health and was having an uneventful day until he stood up at home, walked a couple steps, became acutely lightheaded and diaphoretic, and then experienced a transient loss in consciousness.  This was witnessed by his wife reports that he fell back onto his elbows and did not hit his head.  She describes helping him into a chair where he had shaking of all extremities, lasting up to 1 minute before he regained awareness.  Patient remembers becoming acutely lightheaded and diaphoretic, feeling as though he would pass out, but does not recall the shaking episode or being in the chair.  Symptoms slowly improved but he continued to feel lightheaded on arrival to the ED.  Denies any associated chest pain, palpitations, or shortness of breath.  He has never experienced these symptoms previously.  Denies any recent change in his medications.  Reports that he had not eaten or drank anything today and had remarked to his wife that he felt dehydrated.  No recent fevers or chills, no vomiting, and no diarrhea.  ED Course: Upon arrival to the ED, patient is found to be slightly hypothermic, bradycardic with heart rate 51, blood pressure 89/65, and normal O2 saturations on room air.  EKG features a chronic left bundle branch block and further interpretation limited by significant artifact.  Chemistry panel is unremarkable and CBC is notable for mild leukocytosis.  Troponin is undetectable.  Patient was given 1.5 L of normal saline in the ED, reports feeling much  better, has improvement in blood pressure, and will be observed on the telemetry unit for ongoing evaluation and management of transient loss of consciousness.  Review of Systems:  All other systems reviewed and apart from HPI, are negative.  Past Medical History:  Diagnosis Date  . Allergic to cats   . Arthritis    "left shoulder" (04/20/2014)  . Coronary artery disease   . GERD (gastroesophageal reflux disease)   . High cholesterol   . Hypertension   . LBBB (left bundle branch block) 2010  . Other and unspecified angina pectoris   . Pneumonia X 1  . Seasonal allergies    "fall and spring"    Past Surgical History:  Procedure Laterality Date  . CARDIAC CATHETERIZATION  ~ 2000; 03/08/2014  . CARDIAC CATHETERIZATION  04/20/2014   PCI of a chronic total occlusion in the mid right coronary artery.  There were 4 overlapping stents placed   . CARDIAC CATHETERIZATION  04/20/2014   Procedure: LEFT HEART CATH;  Surgeon: Corky Crafts, MD;  Location: [facility] CATH LAB;  Service: Cardiovascular;;  . CARDIOVASCULAR STRESS TEST  2011  . CORONARY ANGIOPLASTY WITH STENT PLACEMENT  03/09/2014; 04/20/2014   "2 + 4"  . LEFT HEART CATHETERIZATION WITH CORONARY ANGIOGRAM N/A 03/08/2014   Procedure: LEFT HEART CATHETERIZATION WITH CORONARY ANGIOGRAM;  Surgeon: Corky Crafts, MD;  Location: [facility] CATH LAB;  Service: Cardiovascular;  Laterality: N/A;  . PERCUTANEOUS CORONARY STENT INTERVENTION (PCI-S) N/A 03/09/2014   Procedure: PERCUTANEOUS CORONARY STENT INTERVENTION (PCI-S);  Surgeon: Corky Crafts, MD;  Location: [facility] CATH LAB;  Service: Cardiovascular;  Laterality: N/A;  . PERCUTANEOUS CORONARY STENT INTERVENTION (PCI-S) N/A 04/20/2014   Procedure: PERCUTANEOUS CORONARY STENT INTERVENTION (PCI-S);  Surgeon: Corky CraftsJayadeep S Varanasi, MD;  [facility];  Service: Cardiovascular;  Laterality: N/A;  . TONSILLECTOMY       reports that he has never smoked. He has never used smokeless tobacco. He  reports that he drinks alcohol. He reports that he does not use drugs.  Allergies  Allergen Reactions  . Shellfish Allergy Nausea And Vomiting  . Latex Itching, Swelling and Rash    Family History  Problem Relation Age of Onset  . Heart failure Father   . Heart attack Father   . Hypertension Mother   . Healthy Sister   . Healthy Sister      Prior to Admission medications   Medication Sig Start Date End Date Taking? Authorizing Provider  amLODipine (NORVASC) 5 MG tablet TAKE 1 TABLET BY MOUTH DAILY Patient taking differently: TAKE 1 TABLET (5 MG) BY MOUTH DAILY 07/07/17  Yes Corky CraftsVaranasi, Jayadeep S, MD  atorvastatin (LIPITOR) 80 MG tablet Take 1 tablet (80 mg total) by mouth daily. Patient taking differently: Take 80 mg by mouth at bedtime.  06/20/17  Yes Corky CraftsVaranasi, Jayadeep S, MD  citalopram (CELEXA) 40 MG tablet Take 40 mg by mouth daily.   Yes [provider]  clopidogrel (PLAVIX) 75 MG tablet Take 1 tablet (75 mg total) by mouth daily. Please make yearly appt with Dr. Eldridge DaceVaranasi for July. 1st attempt 12/11/17  Yes Corky CraftsVaranasi, Jayadeep S, MD  ezetimibe (ZETIA) 10 MG tablet TAKE 1 TABLET BY MOUTH EVERY DAY. MUST BE SEEN FOR FUTURE REFILLS Patient taking differently: TAKE 1 TABLET (10 MG)  BY MOUTH EVERY DAY. MUST BE SEEN FOR FUTURE REFILLS 06/20/17  Yes Corky CraftsVaranasi, Jayadeep S, MD  ibuprofen (ADVIL,MOTRIN) 200 MG tablet Take 400 mg by mouth every 6 (six) hours as needed for headache (pain).   Yes [provider]  loratadine (CLARITIN) 10 MG tablet Take 10 mg by mouth at bedtime.   Yes [provider]  losartan (COZAAR) 100 MG tablet TAKE 1/2 TABLET BY MOUTH TWICE DAILY Patient taking differently: TAKE 1/2 TABLET (50 MG) BY MOUTH TWICE DAILY 06/20/17  Yes Corky CraftsVaranasi, Jayadeep S, MD  metoprolol tartrate (LOPRESSOR) 25 MG tablet TAKE 1 TABLET(25 MG) BY MOUTH TWICE DAILY 06/20/17  Yes Corky CraftsVaranasi, Jayadeep S, MD  nitroGLYCERIN (NITROSTAT) 0.4 MG SL tablet Place 1 tablet (0.4 mg  total) under the tongue every 5 (five) minutes as needed for chest pain (X3 DOSES BEFORE CALLING 911). 02/15/16  Yes Corky CraftsVaranasi, Jayadeep S, MD  pantoprazole (PROTONIX) 40 MG tablet TAKE 1 TABLET(40 MG) BY MOUTH DAILY 02/20/17  Yes Corky CraftsVaranasi, Jayadeep S, MD    Physical Exam: Vitals:   12/27/17 2145 12/27/17 2245 12/27/17 2300 12/27/17 2315  BP: 102/64 109/72 115/75 104/69  Pulse: 62 70 60 64  Resp: (!) 21 15 (!) 9 (!) 21  Temp:      TempSrc:      SpO2: 97% 95% 98% 98%  Weight:      Height:          Constitutional: NAD, calm  Eyes: PERTLA, lids and conjunctivae normal ENMT: Mucous membranes are moist. Posterior pharynx clear of any exudate or lesions.   Neck: normal, supple, no masses, no thyromegaly Respiratory: clear to auscultation bilaterally, no wheezing, no crackles. Normal respiratory effort.   Cardiovascular: S1 & S2 heard, regular rate and rhythm. No  extremity edema. No significant JVD. Abdomen: No distension, no tenderness, soft. Bowel sounds normal.  Musculoskeletal: no clubbing / cyanosis. No joint deformity upper and lower extremities.    Skin: no significant rashes, lesions, ulcers. Warm, dry, well-perfused. Neurologic: CN 2-12 grossly intact. Sensation intact. Strength 5/5 in all 4 limbs.  Psychiatric: Alert and oriented x 3. Calm, cooperative.     Labs on Admission: I have personally reviewed following labs and imaging studies  CBC: Recent Labs  Lab 12/27/17 2052  WBC 11.2*  HGB 14.4  HCT 42.4  MCV 95.7  PLT 195   Basic Metabolic Panel: Recent Labs  Lab 12/27/17 2052  NA 139  K 4.0  CL 106  CO2 22  GLUCOSE 99  BUN 19  CREATININE 1.08  CALCIUM 8.9   GFR: Estimated Creatinine Clearance: 74.7 mL/min (by C-G formula based on SCr of 1.08 mg/dL). Liver Function Tests: No results for input(s): AST, ALT, ALKPHOS, BILITOT, PROT, ALBUMIN in the last 168 hours. No results for input(s): LIPASE, AMYLASE in the last 168 hours. No results for input(s): AMMONIA  in the last 168 hours. Coagulation Profile: No results for input(s): INR, PROTIME in the last 168 hours. Cardiac Enzymes: No results for input(s): CKTOTAL, CKMB, CKMBINDEX, TROPONINI in the last 168 hours. BNP (last 3 results) No results for input(s): PROBNP in the last 8760 hours. HbA1C: No results for input(s): HGBA1C in the last 72 hours. CBG: No results for input(s): GLUCAP in the last 168 hours. Lipid Profile: No results for input(s): CHOL, HDL, LDLCALC, TRIG, CHOLHDL, LDLDIRECT in the last 72 hours. Thyroid Function Tests: No results for input(s): TSH, T4TOTAL, FREET4, T3FREE, THYROIDAB in the last 72 hours. Anemia Panel: No results for input(s): VITAMINB12, FOLATE, FERRITIN, TIBC, IRON, RETICCTPCT in the last 72 hours. Urine analysis: No results found for: COLORURINE, APPEARANCEUR, LABSPEC, PHURINE, GLUCOSEU, HGBUR, BILIRUBINUR, KETONESUR, PROTEINUR, UROBILINOGEN, NITRITE, LEUKOCYTESUR Sepsis Labs: @LABRCNTIP (procalcitonin:4,lacticidven:4) )No results found for this or any previous visit (from the past 240 hour(s)).   Radiological Exams on Admission: No results found.  EKG: Independently reviewed. Chronic LBBB, suspected sinus rhythm but interpretation limited by artifact, will repeat.   Assessment/Plan  1. Syncope  - Presents following syncope at home, followed by brief shaking of all extremities  - Preceded by lightheadedness and diaphoresis; no chest pain, palpitations, or SOB  - Hypotensive on arrival; EKG interpretation limited by artifact, will repeat  - Orthostatic vitals not documented but were reportedly negative in ED  - BP improved after 1.5 L NS in ED  - Continue cardiac monitoring, check echocardiogram, check MRI and EEG given reported shaking    2. CAD  - No anginal complaints  - Continue statin and Plavix  - Hold beta-blocker and ARB initially given hypotension on arrival    3. Hypertension  - Hypotensive on arrival, improved with IVF  - Hold  Lopressor, Norvasc, and losartan initially     DVT prophylaxis: Lovenox  Code Status: Full  Family Communication: Wife updated at bedside Consults called: None Admission status: Observation    Briscoe Deutscher, MD [facility] Pager [phone]  If 7PM-7AM, please contact night-coverage [email or web address] Password [facility]  12/27/2017, 11:44 PM

## 2017-12-28 ENCOUNTER — Observation Stay (HOSPITAL_COMMUNITY): Payer: Managed Care, Other (non HMO)

## 2017-12-28 ENCOUNTER — Other Ambulatory Visit: Payer: Self-pay

## 2017-12-28 ENCOUNTER — Observation Stay (HOSPITAL_BASED_OUTPATIENT_CLINIC_OR_DEPARTMENT_OTHER): Payer: Managed Care, Other (non HMO)

## 2017-12-28 DIAGNOSIS — R55 Syncope and collapse: Secondary | ICD-10-CM | POA: Diagnosis not present

## 2017-12-28 LAB — CBC
HCT: 40.9 % (ref 39.0–52.0)
Hemoglobin: 13.6 g/dL (ref 13.0–17.0)
MCH: 31.7 pg (ref 26.0–34.0)
MCHC: 33.3 g/dL (ref 30.0–36.0)
MCV: 95.3 fL (ref 78.0–100.0)
PLATELETS: 206 10*3/uL (ref 150–400)
RBC: 4.29 MIL/uL (ref 4.22–5.81)
RDW: 12.3 % (ref 11.5–15.5)
WBC: 10.9 10*3/uL — AB (ref 4.0–10.5)

## 2017-12-28 LAB — URINALYSIS, ROUTINE W REFLEX MICROSCOPIC
BILIRUBIN URINE: NEGATIVE
GLUCOSE, UA: NEGATIVE mg/dL
HGB URINE DIPSTICK: NEGATIVE
Ketones, ur: 20 mg/dL — AB
LEUKOCYTES UA: NEGATIVE
Nitrite: NEGATIVE
PROTEIN: NEGATIVE mg/dL
Specific Gravity, Urine: 1.023 (ref 1.005–1.030)
pH: 5 (ref 5.0–8.0)

## 2017-12-28 LAB — BASIC METABOLIC PANEL
Anion gap: 10 (ref 5–15)
BUN: 16 mg/dL (ref 6–20)
CHLORIDE: 108 mmol/L (ref 101–111)
CO2: 20 mmol/L — ABNORMAL LOW (ref 22–32)
CREATININE: 0.86 mg/dL (ref 0.61–1.24)
Calcium: 8.3 mg/dL — ABNORMAL LOW (ref 8.9–10.3)
GFR calc Af Amer: >60 mL/min (ref >60)
GFR calc non Af Amer: >60 mL/min (ref >60)
Glucose, Bld: 84 mg/dL (ref 65–99)
Potassium: 3.8 mmol/L (ref 3.5–5.1)
SODIUM: 138 mmol/L (ref 135–145)

## 2017-12-28 LAB — ECHOCARDIOGRAM COMPLETE
Height: 67 in
WEIGHTICAEL: 3145.6 [oz_av]

## 2017-12-28 LAB — GLUCOSE, CAPILLARY: Glucose-Capillary: 94 mg/dL (ref 65–99)

## 2017-12-28 LAB — HIV ANTIBODY (ROUTINE TESTING W REFLEX): HIV Screen 4th Generation wRfx: NONREACTIVE

## 2017-12-28 NOTE — Progress Notes (Signed)
Pt takes Claritin at home 1 x day at night, thanks, Glenna FellowsMike F RN.

## 2017-12-28 NOTE — Plan of Care (Signed)
  Problem: Coping: Goal: Level of anxiety will decrease Outcome: Completed/Met   Problem: Pain Managment: Goal: General experience of comfort will improve Outcome: Completed/Met   Problem: Skin Integrity: Goal: Risk for impaired skin integrity will decrease Outcome: Completed/Met

## 2017-12-28 NOTE — Progress Notes (Signed)
*  PRELIMINARY RESULTS* Echocardiogram 2D Echocardiogram has been performed.  Bobby Munoz, Bobby Munoz 12/28/2017, 2:02 PM

## 2017-12-28 NOTE — Progress Notes (Signed)
[facility]                                                                              Patient Demographics  Bobby Munoz, is a 63 y.o. male, DOB - Jul 29, 1956, ONG:[id number]  Admit date - 12/27/2017   Admitting Physician Briscoe Deutscher, MD  Outpatient Primary MD for the patient is Sigmund Hazel, MD  Outpatient specialists:   LOS - 0  days    Chief Complaint  Patient presents with  . Loss of Consciousness       Brief summary  Bobby Munoz is a 62 y.o. male with medical history significant for coronary artery disease with stents, hypertension, chronic left bundle branch block, and hyperlipidemia, presenting with syncope at home, preceded by unspecified period of lightheadedness and diaphoresis, and associated with brief period of shaking of all extremities and borderline hypotension at the ED which improved after 1.5 L normal saline bolus  Assessment & Plan    Principal Problem:   Syncope Active Problems:   HTN (hypertension)   Coronary artery disease  #1 syncope: MRI negative EEG and 2D echocardiogram pending Continue cardiac monitoring awaiting results Cardiology consulted, Dr Shirlee Latch  #2 hypertension: Hypotension on arrival to the ED improved with IV fluid bolus Hold antihypertensive regimen, reintroduce as needed   #3 CAD: No anginal  Complaints Continue statin and Plavix     Code Status:Full code DVT Prophylaxis:  Lovenox  Family Communication: Discussed in detail with the patient, all imaging results, lab results explained to the patient    Disposition Plan: Home  Time Spent in minutes  35 minutes  Procedures:    Consultants:   Cardiology  Antimicrobials:      Medications  Scheduled Meds: . atorvastatin  80 mg Oral q1800  . citalopram  40 mg Oral Daily  . clopidogrel  75 mg Oral Daily  . enoxaparin (LOVENOX) injection  40 mg Subcutaneous Daily  . ezetimibe  10 mg Oral Daily  . pantoprazole  40 mg Oral Daily  . sodium  chloride flush  3 mL Intravenous Q12H   Continuous Infusions: PRN Meds:.acetaminophen **OR** acetaminophen, bisacodyl, HYDROcodone-acetaminophen, ondansetron **OR** ondansetron (ZOFRAN) IV, senna-docusate   Antibiotics   Anti-infectives (From admission, onward)   None        Subjective:   Bobby Munoz was seen and examined today.  Patient denies dizziness, chest pain, shortness of breath, abdominal pain, N/V/D/C, new weakness, numbess, tingling. No acute events overnight.    Objective:   Vitals:   12/28/17 0000 12/28/17 0201 12/28/17 0556 12/28/17 1118  BP: 121/81 (!) 142/86 120/80 117/73  Pulse: 71 76 67 63  Resp: 14 18 18 18   Temp:  98 F (36.7 C) 97.9 F (36.6 C) 98 F (36.7 C)  TempSrc:  Oral Oral Oral  SpO2: 99% 97% 98% 96%  Weight:  89.2 kg (196 lb 9.6 oz)    Height:  5\' 7"  (1.702 m)      Intake/Output Summary (Last 24 hours) at 12/28/2017 1145 Last data filed at 12/28/2017 0833 Gross per 24 hour  Intake 2233.15 ml  Output 800 ml  Net 1433.15 ml  Wt Readings from Last 3 Encounters:  12/28/17 89.2 kg (196 lb 9.6 oz)  03/31/17 94.4 kg (208 lb 1.9 oz)  06/01/16 88.6 kg (195 lb 6.4 oz)     Exam  General: NAD  HEENT: NCAT,  PERRL,MMM  Neck: SUPPLE, (-) JVD  Cardiovascular: RRR, (-) GALLOP, (-) MURMUR  Respiratory: CTA  Gastrointestinal: SOFT, (-) DISTENSION, BS(+), (_) TENDERNESS  Ext: (-) CYANOSIS, (-) EDEMA  Neuro: A, OX 3  Skin:(-) RASH  Psych:NORMAL AFFECT/MOOD   Data Reviewed:  I have personally reviewed following labs and imaging studies  Micro Results No results found for this or any previous visit (from the past 240 hour(s)).  Radiology Reports X-ray Chest Pa And Lateral  Result Date: 12/28/2017 CLINICAL DATA:  Syncope.  Systemic inflammatory response syndrome. EXAM: CHEST - 2 VIEW COMPARISON:  Radiograph 02/01/2014 FINDINGS: The cardiomediastinal contours are normal. Coronary stents visualized. Mild chronic bronchitic  changes, stable. Pulmonary vasculature is normal. No consolidation, pleural effusion, or pneumothorax. No acute osseous abnormalities are seen. Multiple small soft tissue calcifications project over the left chest wall, unchanged from prior. IMPRESSION: No acute findings.  Mild chronic bronchitic change. Electronically Signed   By: Rubye Oaks M.D.   On: 12/28/2017 00:24   [name] Contrast  Result Date: 12/28/2017 CLINICAL DATA:  Syncopal episode. History of hypertension, hypercholesterolemia. EXAM: MRI HEAD WITHOUT CONTRAST TECHNIQUE: Multiplanar, multiecho pulse sequences of the brain and surrounding structures were obtained without intravenous contrast. COMPARISON:  CT HEAD August 12, 2009 FINDINGS: INTRACRANIAL CONTENTS: No reduced diffusion to suggest acute ischemia. No susceptibility artifact to suggest hemorrhage. No parenchymal brain volume loss. No hydrocephalus. Mild asymmetry of the lateral ventricles, normal variant. No suspicious parenchymal signal, masses, mass effect. No abnormal extra-axial fluid collections. No extra-axial masses. VASCULAR: Normal major intracranial vascular flow voids present at skull base. SKULL AND UPPER CERVICAL SPINE: No abnormal sellar expansion. No suspicious calvarial bone marrow signal. Craniocervical junction maintained. SINUSES/ORBITS: Mild paranasal sinus mucosal thickening. Mastoid air cells are well aerated.The included ocular globes and orbital contents are non-suspicious. OTHER: None. IMPRESSION: Normal noncontrast MRI head. Electronically Signed   By: Awilda Metro M.D.   On: 12/28/2017 01:30    Lab Data:  CBC: Recent Labs  Lab 12/27/17 2052 12/28/17 0253  WBC 11.2* 10.9*  HGB 14.4 13.6  HCT 42.4 40.9  MCV 95.7 95.3  PLT 195 206   Basic Metabolic Panel: Recent Labs  Lab 12/27/17 2052 12/28/17 0253  NA 139 138  K 4.0 3.8  CL 106 108  CO2 22 20*  GLUCOSE 99 84  BUN 19 16  CREATININE 1.08 0.86  CALCIUM 8.9 8.3*    GFR: Estimated Creatinine Clearance: 94.9 mL/min (by C-G formula based on SCr of 0.86 mg/dL). Liver Function Tests: No results for input(s): AST, ALT, ALKPHOS, BILITOT, PROT, ALBUMIN in the last 168 hours. No results for input(s): LIPASE, AMYLASE in the last 168 hours. No results for input(s): AMMONIA in the last 168 hours. Coagulation Profile: No results for input(s): INR, PROTIME in the last 168 hours. Cardiac Enzymes: No results for input(s): CKTOTAL, CKMB, CKMBINDEX, TROPONINI in the last 168 hours. BNP (last 3 results) No results for input(s): PROBNP in the last 8760 hours. HbA1C: No results for input(s): HGBA1C in the last 72 hours. CBG: Recent Labs  Lab 12/28/17 0601  GLUCAP 94   Lipid Profile: No results for input(s): CHOL, HDL, LDLCALC, TRIG, CHOLHDL, LDLDIRECT in the last 72 hours. Thyroid Function Tests: No results for  input(s): TSH, T4TOTAL, FREET4, T3FREE, THYROIDAB in the last 72 hours. Anemia Panel: No results for input(s): VITAMINB12, FOLATE, FERRITIN, TIBC, IRON, RETICCTPCT in the last 72 hours. Urine analysis:    Component Value Date/Time   COLORURINE YELLOW 12/28/2017 0209   APPEARANCEUR CLEAR 12/28/2017 0209   LABSPEC 1.023 12/28/2017 0209   PHURINE 5.0 12/28/2017 0209   GLUCOSEU NEGATIVE 12/28/2017 0209   HGBUR NEGATIVE 12/28/2017 0209   BILIRUBINUR NEGATIVE 12/28/2017 0209   KETONESUR 20 (A) 12/28/2017 0209   PROTEINUR NEGATIVE 12/28/2017 0209   NITRITE NEGATIVE 12/28/2017 0209   LEUKOCYTESUR NEGATIVE 12/28/2017 [id number]     Jackie PlumGeorge Osei-Bonsu M.D. [facility] 12/28/2017, 11:45 AM  Pager: [phone] Between 7am to 7pm - call Pager - [phone]  After 7pm go to [email or web address] - password [email or web address]  Call night coverage person covering after 7pm

## 2017-12-29 ENCOUNTER — Other Ambulatory Visit: Payer: Self-pay | Admitting: Medical

## 2017-12-29 ENCOUNTER — Observation Stay (HOSPITAL_COMMUNITY): Payer: Managed Care, Other (non HMO)

## 2017-12-29 DIAGNOSIS — R001 Bradycardia, unspecified: Secondary | ICD-10-CM

## 2017-12-29 DIAGNOSIS — E860 Dehydration: Secondary | ICD-10-CM | POA: Diagnosis not present

## 2017-12-29 DIAGNOSIS — I10 Essential (primary) hypertension: Secondary | ICD-10-CM

## 2017-12-29 DIAGNOSIS — I493 Ventricular premature depolarization: Secondary | ICD-10-CM

## 2017-12-29 DIAGNOSIS — R55 Syncope and collapse: Principal | ICD-10-CM

## 2017-12-29 DIAGNOSIS — I959 Hypotension, unspecified: Secondary | ICD-10-CM | POA: Diagnosis not present

## 2017-12-29 DIAGNOSIS — I2510 Atherosclerotic heart disease of native coronary artery without angina pectoris: Secondary | ICD-10-CM | POA: Diagnosis not present

## 2017-12-29 NOTE — Progress Notes (Signed)
Await EEG and cardiology consult that was called yesterday. Clearly sounds orthostatic as patient not drinking much water and low BP upon EMS arrival.  [facility]  Will ask for hall ambulation.  Marlin CanaryJessica Rahcel Shutes DO

## 2017-12-29 NOTE — Progress Notes (Signed)
Nurse reviewed dc instructions with pt and family, all questions entertained and answered, denies cp sob or dizziness

## 2017-12-29 NOTE — Procedures (Signed)
HPI:  62 y/o with syncope  TECHNICAL SUMMARY:  A multichannel referential and bipolar montage EEG using the standard international 10-20 system was performed on the patient described as awake.  The dominant background activity consists of 9 hertz activity seen most prominantly over the posterior head region.  The backgound activity is reactive to eye opening and closing procedures.  Low voltage fast (beta) activity is distributed symmetrically and maximally over the anterior head regions.  ACTIVATION:  Stepwise photic stimulation at 4-20 flashes per second was performed and did not elicit any abnormal waveforms, although there was fairly significant electrode artifact in the left hemisphere during this portion of the tracing.  Hyperventilation was performed for 3 minutes with good patient effort and produced no changes in the background activity.  EPILEPTIFORM ACTIVITY:  There were no spikes, sharp waves or paroxysmal activity.  SLEEP: No sleep is noted.  CARDIAC:  The EKG lead revealed a bradycardic rhythm at 60 bpm.   IMPRESSION:  This is a normal EEG for the patients stated age.  There were no focal, hemispheric or lateralizing features.  No epileptiform activity was recorded.  A normal EEG does not exclude the diagnosis of a seizure disorder and if seizure remains high on the list of differential diagnosis, an ambulatory EEG may be of value.  Clinical correlation is required.

## 2017-12-29 NOTE — Discharge Summary (Signed)
Physician Discharge Summary  Bobby Munoz:[id number] DOB: Jun 11, 1956 DOA: 12/27/2017  PCP: Sigmund Hazel, MD  Admit date: 12/27/2017 Discharge date: 12/29/2017   Recommendations for Outpatient Follow-Up:   1. Outpatient holter monitor 2. Have held BP meds (can restart norvasc/ACE if needed--- NOT metoprolol)   Discharge Diagnosis:   Principal Problem:   Syncope Active Problems:   HTN (hypertension)   Coronary artery disease   Discharge disposition:  Home.  Discharge Condition: Improved.  Diet recommendation: Low sodium, heart healthy  Wound care: None.   History of Present Illness:   Bobby Munoz is a 62 y.o. male with medical history significant for coronary artery disease with stents, hypertension, chronic left bundle branch block, and hyperlipidemia, now presenting to the emergency department after syncope at home.  Patient reports that he woke in his usual state of health and was having an uneventful day until he stood up at home, walked a couple steps, became acutely lightheaded and diaphoretic, and then experienced a transient loss in consciousness.  This was witnessed by his wife reports that he fell back onto his elbows and did not hit his head.  She describes helping him into a chair where he had shaking of all extremities, lasting up to 1 minute before he regained awareness.  Patient remembers becoming acutely lightheaded and diaphoretic, feeling as though he would pass out, but does not recall the shaking episode or being in the chair.  Symptoms slowly improved but he continued to feel lightheaded on arrival to the ED.  Denies any associated chest pain, palpitations, or shortness of breath.  He has never experienced these symptoms previously.  Denies any recent change in his medications.  Reports that he had not eaten or drank anything today and had remarked to his wife that he felt dehydrated.  No recent fevers or chills, no vomiting, and no  diarrhea.     Hospital Course by Problem:   1. Syncope -- orthostatic (had not been drinking much water) - Presents following syncope at home, followed by brief shaking of all extremities (appeared to be orthostatic) - Preceded by lightheadedness and diaphoresis; no chest pain, palpitations, or SOB  - Hypotensive on arrival - Orthostatic vitals not documented until after IVF  - BP improved after 1.5 L NS in ED  - MRI ok, EEG normal -appreciate cardiology eval-- BB held and plan for event monitor as an outpatient  2. CAD  - No anginal complaints  - Continue statin and Plavix  - Hold BP meds initially given hypotension and low BP currently  3. Hypertension  - Hypotensive on arrival, improved with IVF  - Hold Lopressor, Norvasc, and losartan--- outpatient can resume norvasc and losartan--- hold BB       Medical Consultants:    cards   Discharge Exam:   Vitals:   12/28/17 2012 12/29/17 0540  BP: 137/76 113/84  Pulse: 61 (!) 55  Resp: 15   Temp: 98.5 F (36.9 C) 98 F (36.7 C)  SpO2: 96% 97%   Vitals:   12/28/17 1118 12/28/17 2012 12/29/17 0540 12/29/17 0541  BP: 117/73 137/76 113/84   Pulse: 63 61 (!) 55   Resp: 18 15    Temp: 98 F (36.7 C) 98.5 F (36.9 C) 98 F (36.7 C)   TempSrc: Oral Oral Oral   SpO2: 96% 96% 97%   Weight:    88.7 kg (195 lb 9.6 oz)  Height:        Gen:  NAD Cardiovascular:  RRR, No M/R/G Respiratory: Lungs CTAB Gastrointestinal: Abdomen soft, NT/ND with normal active bowel sounds. Extremities: No C/E/C   The results of significant diagnostics from this hospitalization (including imaging, microbiology, ancillary and laboratory) are listed below for reference.     Procedures and Diagnostic Studies:   X-ray Chest Pa And Lateral  Result Date: 12/28/2017 CLINICAL DATA:  Syncope.  Systemic inflammatory response syndrome. EXAM: CHEST - 2 VIEW COMPARISON:  Radiograph 02/01/2014 FINDINGS: The cardiomediastinal contours are  normal. Coronary stents visualized. Mild chronic bronchitic changes, stable. Pulmonary vasculature is normal. No consolidation, pleural effusion, or pneumothorax. No acute osseous abnormalities are seen. Multiple small soft tissue calcifications project over the left chest wall, unchanged from prior. IMPRESSION: No acute findings.  Mild chronic bronchitic change. Electronically Signed   By: Rubye Oaks M.D.   On: 12/28/2017 00:24   [name] Contrast  Result Date: 12/28/2017 CLINICAL DATA:  Syncopal episode. History of hypertension, hypercholesterolemia. EXAM: MRI HEAD WITHOUT CONTRAST TECHNIQUE: Multiplanar, multiecho pulse sequences of the brain and surrounding structures were obtained without intravenous contrast. COMPARISON:  CT HEAD August 12, 2009 FINDINGS: INTRACRANIAL CONTENTS: No reduced diffusion to suggest acute ischemia. No susceptibility artifact to suggest hemorrhage. No parenchymal brain volume loss. No hydrocephalus. Mild asymmetry of the lateral ventricles, normal variant. No suspicious parenchymal signal, masses, mass effect. No abnormal extra-axial fluid collections. No extra-axial masses. VASCULAR: Normal major intracranial vascular flow voids present at skull base. SKULL AND UPPER CERVICAL SPINE: No abnormal sellar expansion. No suspicious calvarial bone marrow signal. Craniocervical junction maintained. SINUSES/ORBITS: Mild paranasal sinus mucosal thickening. Mastoid air cells are well aerated.The included ocular globes and orbital contents are non-suspicious. OTHER: None. IMPRESSION: Normal noncontrast MRI head. Electronically Signed   By: Awilda Metro M.D.   On: 12/28/2017 01:30     Labs:   Basic Metabolic Panel: Recent Labs  Lab 12/27/17 2052 12/28/17 0253  NA 139 138  K 4.0 3.8  CL 106 108  CO2 22 20*  GLUCOSE 99 84  BUN 19 16  CREATININE 1.08 0.86  CALCIUM 8.9 8.3*   GFR Estimated Creatinine Clearance: 94.6 mL/min (by C-G formula based on SCr of 0.86  mg/dL). Liver Function Tests: No results for input(s): AST, ALT, ALKPHOS, BILITOT, PROT, ALBUMIN in the last 168 hours. No results for input(s): LIPASE, AMYLASE in the last 168 hours. No results for input(s): AMMONIA in the last 168 hours. Coagulation profile No results for input(s): INR, PROTIME in the last 168 hours.  CBC: Recent Labs  Lab 12/27/17 2052 12/28/17 0253  WBC 11.2* 10.9*  HGB 14.4 13.6  HCT 42.4 40.9  MCV 95.7 95.3  PLT 195 206   Cardiac Enzymes: No results for input(s): CKTOTAL, CKMB, CKMBINDEX, TROPONINI in the last 168 hours. BNP: Invalid input(s): POCBNP CBG: Recent Labs  Lab 12/28/17 0601  GLUCAP 94   D-Dimer No results for input(s): DDIMER in the last 72 hours. Hgb A1c No results for input(s): HGBA1C in the last 72 hours. Lipid Profile No results for input(s): CHOL, HDL, LDLCALC, TRIG, CHOLHDL, LDLDIRECT in the last 72 hours. Thyroid function studies No results for input(s): TSH, T4TOTAL, T3FREE, THYROIDAB in the last 72 hours.  Invalid input(s): FREET3 Anemia work up No results for input(s): VITAMINB12, FOLATE, FERRITIN, TIBC, IRON, RETICCTPCT in the last 72 hours. Microbiology No results found for this or any previous visit (from the past 240 hour(s)).   Discharge Instructions:   Discharge Instructions    Diet - low  sodium heart healthy   Complete by:  As directed    Discharge instructions   Complete by:  As directed    Hold BP medications- norvasc, metoprolol and cozaar until seen by PCP   Increase activity slowly   Complete by:  As directed      Allergies as of 12/29/2017      Reactions   Shellfish Allergy Nausea And Vomiting   Latex Itching, Swelling, Rash      Medication List    STOP taking these medications   amLODipine 5 MG tablet Commonly known as:  NORVASC   losartan 100 MG tablet Commonly known as:  COZAAR   metoprolol tartrate 25 MG tablet Commonly known as:  LOPRESSOR     TAKE these medications    atorvastatin 80 MG tablet Commonly known as:  LIPITOR Take 1 tablet (80 mg total) by mouth daily. What changed:  when to take this   citalopram 40 MG tablet Commonly known as:  CELEXA Take 40 mg by mouth daily.   clopidogrel 75 MG tablet Commonly known as:  PLAVIX Take 1 tablet (75 mg total) by mouth daily. Please make yearly appt with Dr. Eldridge DaceVaranasi for July. 1st attempt   ezetimibe 10 MG tablet Commonly known as:  ZETIA TAKE 1 TABLET BY MOUTH EVERY DAY. MUST BE SEEN FOR FUTURE REFILLS What changed:  See the new instructions.   ibuprofen 200 MG tablet Commonly known as:  ADVIL,MOTRIN Take 400 mg by mouth every 6 (six) hours as needed for headache (pain).   loratadine 10 MG tablet Commonly known as:  CLARITIN Take 10 mg by mouth at bedtime.   nitroGLYCERIN 0.4 MG SL tablet Commonly known as:  NITROSTAT Place 1 tablet (0.4 mg total) under the tongue every 5 (five) minutes as needed for chest pain (X3 DOSES BEFORE CALLING 911).   pantoprazole 40 MG tablet Commonly known as:  PROTONIX TAKE 1 TABLET(40 MG) BY MOUTH DAILY      Follow-up Information    Sigmund HazelMiller, Lisa, MD Follow up in 1 week(s).   Specialty:  Family Medicine Why:  for BP check and resumption of BP medications Contact information: [address] [place] [address] [phone]        Corky CraftsVaranasi, Jayadeep S, MD Follow up.   Specialties:  Cardiology, Radiology, Interventional Cardiology Why:  Continue to follow-up annually - will be due for a visit 03/2018 Contact information: [address] [place] [address] [phone]        Holter monitor Follow up on 01/01/2018.   Why:  Please arrive 15 minutes early for your 12:30pm appointment to pick up your cardiac monitor Contact information: [facility] [address] [place] [address]       Dyann KiefLenze, Michele M, PA-C Follow up on 01/13/2018.   Specialty:  Cardiology Why:  Please arrive 15  minutes early for your 11:30am appointment.  Contact information: [address] [place] [address] [phone]            Time coordinating discharge: 35 min  Signed:  Joseph ArtJessica U Xzaviar Maloof   [facility] 12/29/2017, 2:44 PM

## 2017-12-29 NOTE — Consult Note (Addendum)
Cardiology Consultation:   Patient ID: KHYRE GERMOND; [id number]; 1956/02/12   Admit date: 12/27/2017 Date of Consult: 12/29/2017  Primary Care Provider: Sigmund Hazel, MD Primary Cardiologist: Lance Muss, MD Primary Electrophysiologist:  None   Patient Profile:   Bobby Munoz is a 62 y.o. male with a PMH of CAD with multiple DES to LAD and RCA, HLD, chronic LBBB, and GERD who is being seen today for the evaluation of syncope at the request of Dr. Benjamine Mola.  History of Present Illness:   Bobby Munoz was in his usual state of health until the evening of 12/27/17 when he stood up from sitting in his recliner, took a few steps, felt lightheaded and diaphoretic, and subsequently lost consciousness for ~2 minutes. He was noted to have some shaking after initial syncope by his wife which resolved spontaneously after 2 minutes. EMS was activated and the patient was brought to the ED for further evaluation.   Patient was last evaluated by Dr. Eldridge Dace 03/2017 and was thought to be doing well from a cardiac standpoint. He was without anginal complaints or syncope. No medication changes occurred at that visit and he was recommended to continue exercising and follow-up in 1 year. His last ischemic evaluation was a staged PCI [facility] 04/2014 where he had 4 overlapping DES's placed to RCA for management of CTO. Also with 2 overlapping DES's to LAD placed on prior [facility] 02/2014. Echocardiogram this admission with EF 55-60%, normal LV diastolic function, no wall motion abnormalities, and mild MR.   He currently feels back to his usual self. On the day of the syncopal event he woke up, drank coffee, went to the gym where he exercised on a treadmill for an hour, and then ran errands. He did not eat or drink anything until 5pm that evening. He denies any pre/post syncopal chest pain. He denied nausea/vomiting, tongue biting, or loss of bowel/bladder during syncopal event. He noted some continued lightheadedness following  the episode which resolved after arrival to the ED and he received IVFs. He has never had a syncopal event in the past. He denies current palpitations but said he has worn an event monitor in the past for evaluation of PVCs.   Hospital course: BP on arrival to the ED was 89/65 and HR 51; VSS over the past 24 hours. Labs notable for electrolytes wnl, Cr 1.08 > 0.86 (baseline 0.8), Hgb 14.4, PLT 195, WBC 11.2, Trop negative x1, HIV negative, UA negative with the exception of +ketones. EKG with sinus rhythm with PVCs, chronic LBBB. CXR without acute findings. MRI brain was normal. Echo with EF 55-60%, normal LV diastolic function, no wall motion abnormalities, and mild MR. EEG negative. He received IVF with improvement in BP, lightheadedness, and AKI. Cardiology asked to evaluate for further recommendations.   Past Medical History:  Diagnosis Date  . Allergic to cats   . Arthritis    "left shoulder" (04/20/2014)  . Coronary artery disease   . GERD (gastroesophageal reflux disease)   . High cholesterol   . Hypertension   . LBBB (left bundle branch block) 2010  . Other and unspecified angina pectoris   . Pneumonia X 1  . Seasonal allergies    "fall and spring"    Past Surgical History:  Procedure Laterality Date  . CARDIAC CATHETERIZATION  ~ 2000; 03/08/2014  . CARDIAC CATHETERIZATION  04/20/2014   PCI of a chronic total occlusion in the mid right coronary artery.  There were 4 overlapping stents placed   .  CARDIAC CATHETERIZATION  04/20/2014   Procedure: LEFT HEART CATH;  Surgeon: Corky CraftsJayadeep S Varanasi, MD;  [facility];  Service: Cardiovascular;;  . CARDIOVASCULAR STRESS TEST  2011  . CORONARY ANGIOPLASTY WITH STENT PLACEMENT  03/09/2014; 04/20/2014   "2 + 4"  . LEFT HEART CATHETERIZATION WITH CORONARY ANGIOGRAM N/A 03/08/2014   Procedure: LEFT HEART CATHETERIZATION WITH CORONARY ANGIOGRAM;  Surgeon: Corky CraftsJayadeep S Varanasi, MD;  [facility];  Service: Cardiovascular;  Laterality: N/A;   . PERCUTANEOUS CORONARY STENT INTERVENTION (PCI-S) N/A 03/09/2014   Procedure: PERCUTANEOUS CORONARY STENT INTERVENTION (PCI-S);  Surgeon: Corky CraftsJayadeep S Varanasi, MD;  [facility];  Service: Cardiovascular;  Laterality: N/A;  . PERCUTANEOUS CORONARY STENT INTERVENTION (PCI-S) N/A 04/20/2014   Procedure: PERCUTANEOUS CORONARY STENT INTERVENTION (PCI-S);  Surgeon: Corky CraftsJayadeep S Varanasi, MD;  [facility];  Service: Cardiovascular;  Laterality: N/A;  . TONSILLECTOMY       Home Medications:  Prior to Admission medications   Medication Sig Start Date End Date Taking? Authorizing Provider  amLODipine (NORVASC) 5 MG tablet TAKE 1 TABLET BY MOUTH DAILY Patient taking differently: TAKE 1 TABLET (5 MG) BY MOUTH DAILY 07/07/17  Yes Corky CraftsVaranasi, Jayadeep S, MD  atorvastatin (LIPITOR) 80 MG tablet Take 1 tablet (80 mg total) by mouth daily. Patient taking differently: Take 80 mg by mouth at bedtime.  06/20/17  Yes Corky CraftsVaranasi, Jayadeep S, MD  citalopram (CELEXA) 40 MG tablet Take 40 mg by mouth daily.   Yes [provider]  clopidogrel (PLAVIX) 75 MG tablet Take 1 tablet (75 mg total) by mouth daily. Please make yearly appt with Dr. Eldridge DaceVaranasi for July. 1st attempt 12/11/17  Yes Corky CraftsVaranasi, Jayadeep S, MD  ezetimibe (ZETIA) 10 MG tablet TAKE 1 TABLET BY MOUTH EVERY DAY. MUST BE SEEN FOR FUTURE REFILLS Patient taking differently: TAKE 1 TABLET (10 MG)  BY MOUTH EVERY DAY. MUST BE SEEN FOR FUTURE REFILLS 06/20/17  Yes Corky CraftsVaranasi, Jayadeep S, MD  ibuprofen (ADVIL,MOTRIN) 200 MG tablet Take 400 mg by mouth every 6 (six) hours as needed for headache (pain).   Yes [provider]  loratadine (CLARITIN) 10 MG tablet Take 10 mg by mouth at bedtime.   Yes [provider]  losartan (COZAAR) 100 MG tablet TAKE 1/2 TABLET BY MOUTH TWICE DAILY Patient taking differently: TAKE 1/2 TABLET (50 MG) BY MOUTH TWICE DAILY 06/20/17  Yes Corky CraftsVaranasi, Jayadeep S, MD  metoprolol tartrate (LOPRESSOR) 25 MG  tablet TAKE 1 TABLET(25 MG) BY MOUTH TWICE DAILY 06/20/17  Yes Corky CraftsVaranasi, Jayadeep S, MD  nitroGLYCERIN (NITROSTAT) 0.4 MG SL tablet Place 1 tablet (0.4 mg total) under the tongue every 5 (five) minutes as needed for chest pain (X3 DOSES BEFORE CALLING 911). 02/15/16  Yes Corky CraftsVaranasi, Jayadeep S, MD  pantoprazole (PROTONIX) 40 MG tablet TAKE 1 TABLET(40 MG) BY MOUTH DAILY 02/20/17  Yes Corky CraftsVaranasi, Jayadeep S, MD    Inpatient Medications: Scheduled Meds: . atorvastatin  80 mg Oral q1800  . citalopram  40 mg Oral Daily  . clopidogrel  75 mg Oral Daily  . enoxaparin (LOVENOX) injection  40 mg Subcutaneous Daily  . ezetimibe  10 mg Oral Daily  . pantoprazole  40 mg Oral Daily  . sodium chloride flush  3 mL Intravenous Q12H   Continuous Infusions:  PRN Meds: acetaminophen **OR** acetaminophen, bisacodyl, HYDROcodone-acetaminophen, ondansetron **OR** ondansetron (ZOFRAN) IV, senna-docusate  Allergies:    Allergies  Allergen Reactions  . Shellfish Allergy Nausea And Vomiting  . Latex Itching, Swelling and  Rash    Social History:   Social History   Socioeconomic History  . Marital status: Married    Spouse name: Not on file  . Number of children: Not on file  . Years of education: Not on file  . Highest education level: Not on file  Occupational History  . Not on file  Social Needs  . Financial resource strain: Not on file  . Food insecurity:    Worry: Not on file    Inability: Not on file  . Transportation needs:    Medical: Not on file    Non-medical: Not on file  Tobacco Use  . Smoking status: Never Smoker  . Smokeless tobacco: Never Used  Substance and Sexual Activity  . Alcohol use: Yes    Comment: 04/20/2014 "used to have couple glasses of wine/wk; none since 02/2014"  . Drug use: No  . Sexual activity: Yes  Lifestyle  . Physical activity:    Days per week: Not on file    Minutes per session: Not on file  . Stress: Not on file  Relationships  . Social connections:     Talks on phone: Not on file    Gets together: Not on file    Attends religious service: Not on file    Active member of club or organization: Not on file    Attends meetings of clubs or organizations: Not on file    Relationship status: Not on file  . Intimate partner violence:    Fear of current or ex partner: Not on file    Emotionally abused: Not on file    Physically abused: Not on file    Forced sexual activity: Not on file  Other Topics Concern  . Not on file  Social History Narrative  . Not on file    Family History:    Family History  Problem Relation Age of Onset  . Heart failure Father   . Heart attack Father   . Hypertension Mother   . Healthy Sister   . Healthy Sister      ROS:  Please see the history of present illness.   All other ROS reviewed and negative.     Physical Exam/Data:   Vitals:   12/28/17 1118 12/28/17 2012 12/29/17 0540 12/29/17 0541  BP: 117/73 137/76 113/84   Pulse: 63 61 (!) 55   Resp: 18 15    Temp: 98 F (36.7 C) 98.5 F (36.9 C) 98 F (36.7 C)   TempSrc: Oral Oral Oral   SpO2: 96% 96% 97%   Weight:    195 lb 9.6 oz (88.7 kg)  Height:        Intake/Output Summary (Last 24 hours) at 12/29/2017 1301 Last data filed at 12/29/2017 1000 Gross per 24 hour  Intake 580 ml  Output 1300 ml  Net -720 ml   Filed Weights   12/27/17 2033 12/28/17 0201 12/29/17 0541  Weight: 192 lb (87.1 kg) 196 lb 9.6 oz (89.2 kg) 195 lb 9.6 oz (88.7 kg)   Body mass index is 30.64 kg/m.  General:  Well nourished, well developed, laying in bed in no acute distress HEENT: sclera anicteric, MMM  Neck: no JVD Vascular: No carotid bruits; distal pulses 2+ bilaterally Cardiac:  normal S1, S2; RRR; no murmurs, gallops, or rubs Lungs:  clear to auscultation bilaterally, no wheezing, rhonchi or rales  Abd: NABS, soft, nontender, nondistended Ext: no edema Musculoskeletal:  No deformities, BUE and BLE strength normal and equal  Skin: warm and dry  Neuro:   CNs 2-12 intact, no focal abnormalities noted Psych:  Normal affect   EKG:  The EKG was personally reviewed and demonstrates:  Sinus rhythm with PACs/PVCs and LBBB (chronic) Telemetry:  Telemetry was personally reviewed and demonstrates:  Sinus rhythm with PACs/PVCs, chronic LBBB  Relevant CV Studies: Echocardiogram 12/28/17: Study Conclusions  - Left ventricle: The cavity size was normal. Systolic function was   normal. The estimated ejection fraction was in the range of 55%   to 60%. Wall motion was normal; there were no regional wall   motion abnormalities. Left ventricular diastolic function   parameters were normal. - Aortic valve: Transvalvular velocity was within the normal range.   There was no stenosis. There was no regurgitation. - Mitral valve: Transvalvular velocity was within the normal range.   There was no evidence for stenosis. There was mild regurgitation.   Valve area by pressure half-time: 1.68 cm^2. - Right ventricle: The cavity size was normal. Wall thickness was   normal. Systolic function was normal. - Atrial septum: No defect or patent foramen ovale was identified   by color flow Doppler. - Tricuspid valve: There was trivial regurgitation. - Pulmonary arteries: Systolic pressure was within the normal   range. PA peak pressure: 28 mm Hg (S).  Laboratory Data:  Chemistry Recent Labs  Lab 12/27/17 2052 12/28/17 0253  NA 139 138  K 4.0 3.8  CL 106 108  CO2 22 20*  GLUCOSE 99 84  BUN 19 16  CREATININE 1.08 0.86  CALCIUM 8.9 8.3*  GFRNONAA >60 >60  GFRAA >60 >60  ANIONGAP 11 10    No results for input(s): PROT, ALBUMIN, AST, ALT, ALKPHOS, BILITOT in the last 168 hours. Hematology Recent Labs  Lab 12/27/17 2052 12/28/17 0253  WBC 11.2* 10.9*  RBC 4.43 4.29  HGB 14.4 13.6  HCT 42.4 40.9  MCV 95.7 95.3  MCH 32.5 31.7  MCHC 34.0 33.3  RDW 12.2 12.3  PLT 195 206   Cardiac EnzymesNo results for input(s): TROPONINI in the last 168 hours.    Recent Labs  Lab 12/27/17 2229  TROPIPOC 0.00    BNPNo results for input(s): BNP, PROBNP in the last 168 hours.  DDimer No results for input(s): DDIMER in the last 168 hours.  Radiology/Studies:  X-ray Chest Pa And Lateral  Result Date: 12/28/2017 CLINICAL DATA:  Syncope.  Systemic inflammatory response syndrome. EXAM: CHEST - 2 VIEW COMPARISON:  Radiograph 02/01/2014 FINDINGS: The cardiomediastinal contours are normal. Coronary stents visualized. Mild chronic bronchitic changes, stable. Pulmonary vasculature is normal. No consolidation, pleural effusion, or pneumothorax. No acute osseous abnormalities are seen. Multiple small soft tissue calcifications project over the left chest wall, unchanged from prior. IMPRESSION: No acute findings.  Mild chronic bronchitic change. Electronically Signed   By: Rubye Oaks M.D.   On: 12/28/2017 00:24   [name] Contrast  Result Date: 12/28/2017 CLINICAL DATA:  Syncopal episode. History of hypertension, hypercholesterolemia. EXAM: MRI HEAD WITHOUT CONTRAST TECHNIQUE: Multiplanar, multiecho pulse sequences of the brain and surrounding structures were obtained without intravenous contrast. COMPARISON:  CT HEAD August 12, 2009 FINDINGS: INTRACRANIAL CONTENTS: No reduced diffusion to suggest acute ischemia. No susceptibility artifact to suggest hemorrhage. No parenchymal brain volume loss. No hydrocephalus. Mild asymmetry of the lateral ventricles, normal variant. No suspicious parenchymal signal, masses, mass effect. No abnormal extra-axial fluid collections. No extra-axial masses. VASCULAR: Normal major intracranial vascular flow voids present at skull base. SKULL AND UPPER CERVICAL  SPINE: No abnormal sellar expansion. No suspicious calvarial bone marrow signal. Craniocervical junction maintained. SINUSES/ORBITS: Mild paranasal sinus mucosal thickening. Mastoid air cells are well aerated.The included ocular globes and orbital contents are non-suspicious.  OTHER: None. IMPRESSION: Normal noncontrast MRI head. Electronically Signed   By: Awilda Metro M.D.   On: 12/28/2017 01:30    Assessment and Plan:   1. Syncope: patient with brief syncopal episode shortly after standing up. Without CP complaints pre/post syncopal event. Found to be hypotensive on arrival to the ED. Labs and urine studies consistent with poor po intake given ketones on UA and AKI.  Echo reassuring with EF 55-60%, normal LV function, no wall motion abnormalities, and no significant valvular abnormalities. EEG negative. Symptoms, VS, and labs improved with IVF resuscitation. Suspect syncope due to orthostatic hypotension.  - Home amlodipine, metoprolol, and losartan on hold for hypotension and AKI - Okay to resume losartan and amlodipine  - Hold metoprolol at discharge  - No further cardiac testing/monitoring at this time. Will arrange outpatient 48 hour cardiac monitor and follow-up in the cardiology office.   2. CAD s/p multiple PCI/DES to RCA and LAD: appears stable. Patient is without anginal complaints. Echo detailed above. - Continue plavix and zetia  3. HTN: hypotensive on arrival; improved with IVF. Home antihypertensives on hold - Okay to resume losartan and amlodipine  - Hold metoprolol at discharge   4. HLD: Last LDL 63 03/2017; at goal of <16 - Continue zetia  Okay for discharge today from a cardiology standpoint.   For questions or updates, please contact [facility] Please consult [email or web address] for contact info under Cardiology/STEMI.   Signed, Beatriz Stallion, PA-C  12/29/2017 1:01 PM [phone]  The patient was seen, examined and discussed with Duane Boston , PA-C and I agree with the above.   63 y.o. male with a PMH of CAD with multiple DES to LAD and RCA, HLD, chronic LBBB, and GERD who is being seen today for the evaluation of syncope. Happened on evening of 12/27/17 when he stood up from sitting in his recliner, took a few steps, felt  lightheaded and diaphoretic, and subsequently lost consciousness for ~2 minutes. Recovered spontaneously in few minutes. He was  Hypotensive and bradycardic on arrival, appeared dehydrated. Now feeling better, however telemetry shows sinus bradycardia with very frequent PACs in a pattern of bigeminy, few PVCs and pauses up to 3 seconds.  Plan: Hold metoprolol Discharge home We will arrange for a 48 hour Holter monitor to evaluate possible significant bradycardia and pauses once metoprolol out of system - we will arrange for a cardiology follow up  Tobias Alexander, MD 12/29/2017

## 2017-12-29 NOTE — Discharge Instructions (Signed)
Continue to take your amlodipine and losartan as previously prescribed after seen by PCP for BP check   Do not take your metoprolol until instructed to restart by someone from the Cardiology office.

## 2017-12-29 NOTE — Progress Notes (Signed)
EEG completed, results pending. 

## 2017-12-29 NOTE — Plan of Care (Signed)
  Problem: Clinical Measurements: Goal: Respiratory complications will improve Outcome: Completed/Met   Problem: Activity: Goal: Risk for activity intolerance will decrease Outcome: Completed/Met   Problem: Elimination: Goal: Will not experience complications related to bowel motility Outcome: Completed/Met Goal: Will not experience complications related to urinary retention Outcome: Completed/Met   Problem: Safety: Goal: Ability to remain free from injury will improve Outcome: Completed/Met

## 2018-01-01 ENCOUNTER — Ambulatory Visit (INDEPENDENT_AMBULATORY_CARE_PROVIDER_SITE_OTHER): Payer: Managed Care, Other (non HMO)

## 2018-01-01 ENCOUNTER — Telehealth: Payer: Self-pay | Admitting: *Deleted

## 2018-01-01 ENCOUNTER — Other Ambulatory Visit: Payer: Self-pay | Admitting: Medical

## 2018-01-01 DIAGNOSIS — R55 Syncope and collapse: Secondary | ICD-10-CM

## 2018-01-01 DIAGNOSIS — I493 Ventricular premature depolarization: Secondary | ICD-10-CM

## 2018-01-01 NOTE — Telephone Encounter (Signed)
Patient discharged from hospital Monday, 12/29/17.  Patient told to hold Amlodipine, Losartan until he follow up with PCP on 01/06/18.  Patient also told to hold Metoprolol until he follow up with Cardiology, 01/13/18.  Patient states his blood pressure has been elevated and he is having more palpitations.  He is concerned about waiting so long for further instructions.  Please contact.

## 2018-01-02 ENCOUNTER — Encounter: Payer: Self-pay | Admitting: Interventional Cardiology

## 2018-01-02 MED ORDER — AMLODIPINE BESYLATE 5 MG PO TABS: 5.0000 mg | ORAL_TABLET | Freq: Every day | ORAL | 1 refills | Status: DC

## 2018-01-02 NOTE — Telephone Encounter (Signed)
OK to resume amlodipine 5 mg daily only.  48 Hour Holter was to be ordered when he left the hospital.  Stay off beta blocker.

## 2018-01-02 NOTE — Telephone Encounter (Signed)
Patient returning your call.

## 2018-01-02 NOTE — Telephone Encounter (Signed)
Left message for patient to call back  

## 2018-01-02 NOTE — Telephone Encounter (Signed)
Cleotis NipperShobowale, Lashonda D at 01/02/2018 11:28 AM   Status: Signed    Patient returning your call

## 2018-01-02 NOTE — Addendum Note (Signed)
Addended by: Daleen BoURRIE, Sultan Pargas I on: 01/02/2018 11:55 AM   Modules accepted: Orders

## 2018-01-02 NOTE — Telephone Encounter (Signed)
Patient returning call. Patient states that his BP has been elevated since he was discharged from the hospital. Patient was seen in the hospital for syncope. Patient's amlodipine, losartan, and metoprolol were all d/c'd. Patient reports BP readings of 160/90 and 145/89. Patient reports HR 60s. Instructed patient to restart amlodipine 5 mg QD only. Instructed patient to remain off of his losartan and metoprolol for now. Patient is currently wearing his holter monitor. Patient has an appointment with his PCP on Tuesday. Instructed patient to continue to monitor his BP and stay hydrated. Patient verbalized understanding and thanked me for the call.

## 2018-01-02 NOTE — Telephone Encounter (Signed)
This encounter was created in error - please disregard.

## 2018-01-13 ENCOUNTER — Ambulatory Visit: Payer: Managed Care, Other (non HMO) | Admitting: Physician Assistant

## 2018-01-15 ENCOUNTER — Encounter: Payer: Self-pay | Admitting: Cardiology

## 2018-01-15 ENCOUNTER — Ambulatory Visit (INDEPENDENT_AMBULATORY_CARE_PROVIDER_SITE_OTHER): Payer: Managed Care, Other (non HMO) | Admitting: Cardiology

## 2018-01-15 VITALS — BP 130/82 | HR 64 | Ht 67.0 in | Wt 196.0 lb

## 2018-01-15 DIAGNOSIS — R001 Bradycardia, unspecified: Secondary | ICD-10-CM | POA: Diagnosis not present

## 2018-01-15 MED ORDER — LOSARTAN POTASSIUM 100 MG PO TABS: 50.0000 mg | ORAL_TABLET | Freq: Two times a day (BID) | ORAL | 3 refills | Status: DC

## 2018-01-15 NOTE — Progress Notes (Signed)
01/15/2018 Bobby Munoz   [birth year]  [id number]  Primary Physician Sigmund Hazel, MD Primary Cardiologist: Dr. Eldridge Dace   Reason for Visit/CC: Hospital follow-up for bradycardia and syncope  HPI:  Bobby Munoz is a 62 y.o. male who is being seen today for hospital follow-up for bradycardia and syncope.  He has a history of CAD and has undergone previous stenting to the LAD and RCA.  Also has history of hypertension, hyperlipidemia, chronic left bundle branch block and GERD.  He recently presented to the hospital on December 27, 2017 after having a syncopal event.  The patient reportedly had just returned home from the gym and was getting up from a seated position, took a few steps and felt lightheaded and diaphoretic and subsequently lost consciousness for roughly 2 minutes.  He was brought to the hospital by EMS.  In the ED, he was noted to be hypotensive and bradycardic.  He was also felt to be dehydrated and was given IV fluids.  In retrospect the patient admits that he was not staying well-hydrated.  He had just gone to the gym and also spent some time outdoors without adequate hydration.  His home amlodipine, metoprolol and losartan were all held for hypotension and acute kidney injury.  He was given IV fluids.  His metoprolol was held due to bradycardia.  He was also noticed to have a 3.1-second pause on telemetry.  His bradycardia improved after discontinuation of metoprolol.  Echocardiogram showed normal left ventricular EF, normal wall motion and no significant valvular abnormalities.  He denied chest pain.  Prior to discharge, his amlodipine was resumed.  The patient was discharged home with a 48-hour cardiac monitor.  This showed normal sinus rhythm with an average heart rate of 61 bpm.  Slowest heart rate was 44 bpm but this was in the early morning hours during sleep.  There were no pauses.  He presents back to clinic today for post hospital follow-up.  He is accompanied by his wife.  He  reports that he  has done well since discharge.  He denies any recurrent syncope.  No dizziness, lightheadedness.  No dyspnea or chest pain.  Heart rate is 62 bpm.  Blood pressure is 130/82.  He has been checking his blood pressure at home and systolic blood pressures have averaged in the 130s to 140s.  He is requesting to go back on losartan.  SCr creatinine day of discharge was normal at 0.86.  Current Meds  Medication Sig  . amLODipine (NORVASC) 5 MG tablet Take 1 tablet (5 mg total) by mouth daily.  Marland Kitchen atorvastatin (LIPITOR) 80 MG tablet Take 1 tablet (80 mg total) by mouth daily. (Patient taking differently: Take 80 mg by mouth at bedtime. )  . citalopram (CELEXA) 40 MG tablet Take 40 mg by mouth daily.  . clopidogrel (PLAVIX) 75 MG tablet Take 1 tablet (75 mg total) by mouth daily. Please make yearly appt with Dr. Eldridge Dace for July. 1st attempt  . ezetimibe (ZETIA) 10 MG tablet TAKE 1 TABLET BY MOUTH EVERY DAY. MUST BE SEEN FOR FUTURE REFILLS (Patient taking differently: TAKE 1 TABLET (10 MG)  BY MOUTH EVERY DAY. MUST BE SEEN FOR FUTURE REFILLS)  . ibuprofen (ADVIL,MOTRIN) 200 MG tablet Take 400 mg by mouth every 6 (six) hours as needed for headache (pain).  Marland Kitchen loratadine (CLARITIN) 10 MG tablet Take 10 mg by mouth at bedtime.  . nitroGLYCERIN (NITROSTAT) 0.4 MG SL tablet Place 1 tablet (0.4 mg total) under  the tongue every 5 (five) minutes as needed for chest pain (X3 DOSES BEFORE CALLING 911).  . pantoprazole (PROTONIX) 40 MG tablet TAKE 1 TABLET(40 MG) BY MOUTH DAILY   Allergies  Allergen Reactions  . Shellfish Allergy Nausea And Vomiting  . Latex Itching, Swelling and Rash   Past Medical History:  Diagnosis Date  . Allergic to cats   . Arthritis    "left shoulder" (04/20/2014)  . Coronary artery disease   . GERD (gastroesophageal reflux disease)   . High cholesterol   . Hypertension   . LBBB (left bundle branch block) 2010  . Other and unspecified angina pectoris   . Pneumonia X 1   . Seasonal allergies    "fall and spring"   Family History  Problem Relation Age of Onset  . Heart failure Father   . Heart attack Father   . Hypertension Mother   . Healthy Sister   . Healthy Sister    Past Surgical History:  Procedure Laterality Date  . CARDIAC CATHETERIZATION  ~ 2000; 03/08/2014  . CARDIAC CATHETERIZATION  04/20/2014   PCI of a chronic total occlusion in the mid right coronary artery.  There were 4 overlapping stents placed   . CARDIAC CATHETERIZATION  04/20/2014   Procedure: LEFT HEART CATH;  Surgeon: Corky Crafts, MD;  Location: [facility] CATH LAB;  Service: Cardiovascular;;  . CARDIOVASCULAR STRESS TEST  2011  . CORONARY ANGIOPLASTY WITH STENT PLACEMENT  03/09/2014; 04/20/2014   "2 + 4"  . LEFT HEART CATHETERIZATION WITH CORONARY ANGIOGRAM N/A 03/08/2014   Procedure: LEFT HEART CATHETERIZATION WITH CORONARY ANGIOGRAM;  Surgeon: Corky Crafts, MD;  Location: [facility] CATH LAB;  Service: Cardiovascular;  Laterality: N/A;  . PERCUTANEOUS CORONARY STENT INTERVENTION (PCI-S) N/A 03/09/2014   Procedure: PERCUTANEOUS CORONARY STENT INTERVENTION (PCI-S);  Surgeon: Corky Crafts, MD;  Location: [facility] CATH LAB;  Service: Cardiovascular;  Laterality: N/A;  . PERCUTANEOUS CORONARY STENT INTERVENTION (PCI-S) N/A 04/20/2014   Procedure: PERCUTANEOUS CORONARY STENT INTERVENTION (PCI-S);  Surgeon: Corky Crafts, MD;  Location: [facility] CATH LAB;  Service: Cardiovascular;  Laterality: N/A;  . TONSILLECTOMY     Social History   Socioeconomic History  . Marital status: Married    Spouse name: Not on file  . Number of children: Not on file  . Years of education: Not on file  . Highest education level: Not on file  Occupational History  . Not on file  Social Needs  . Financial resource strain: Not on file  . Food insecurity:    Worry: Not on file    Inability: Not on file  . Transportation needs:    Medical: Not on file    Non-medical: Not on file  Tobacco Use  . Smoking  status: Never Smoker  . Smokeless tobacco: Never Used  Substance and Sexual Activity  . Alcohol use: Yes    Comment: 04/20/2014 "used to have couple glasses of wine/wk; none since 02/2014"  . Drug use: No  . Sexual activity: Yes  Lifestyle  . Physical activity:    Days per week: Not on file    Minutes per session: Not on file  . Stress: Not on file  Relationships  . Social connections:    Talks on phone: Not on file    Gets together: Not on file    Attends religious service: Not on file    Active member of club or organization: Not on file    Attends meetings of clubs or organizations:  Not on file    Relationship status: Not on file  . Intimate partner violence:    Fear of current or ex partner: Not on file    Emotionally abused: Not on file    Physically abused: Not on file    Forced sexual activity: Not on file  Other Topics Concern  . Not on file  Social History Narrative  . Not on file     Review of Systems: General: negative for chills, fever, night sweats or weight changes.  Cardiovascular: negative for chest pain, dyspnea on exertion, edema, orthopnea, palpitations, paroxysmal nocturnal dyspnea or shortness of breath Dermatological: negative for rash Respiratory: negative for cough or wheezing Urologic: negative for hematuria Abdominal: negative for nausea, vomiting, diarrhea, bright red blood per rectum, melena, or hematemesis Neurologic: negative for visual changes, syncope, or dizziness All other systems reviewed and are otherwise negative except as noted above.   Physical Exam:  Blood pressure 130/82, pulse 64, height  (1.702 m), weight 196 lb (88.9 kg), SpO2 98 %.  General appearance: alert, cooperative and no distress Neck: no carotid bruit and no JVD Lungs: clear to auscultation bilaterally Heart: regular rate and rhythm, S1, S2 normal, no murmur, click, rub or gallop Extremities: extremities normal, atraumatic, no cyanosis or edema Pulses: 2+ and  symmetric Skin: Skin color, texture, turgor normal. No rashes or lesions Neurologic: Grossly normal  EKG Not performed -- personally reviewed   ASSESSMENT AND PLAN:   1. Syncope: felt secondary to orthostatic hypotension 2/2 dehydration + bradycardia. No recurrence after IVF resuscitation. Echo normal. bradycardia resolved after BB discontinued. Pt now staying well hydrated.   2. Bradycardia: resolved after metoprolol was discontinued. 48 hr monitor showed NSR with average HR in the 60s. Slowest HR was in the mid 40s but during early morning hours during sleep. HR currently 62 bpm. Will avoid any further use of AV nodal blocking agents.   3. CAD: stable w/o CP. Continue current regimen. No BBs due to bradycardia.   4. Hypertension: 130/82 today. SBPs at home in the 130s-140s. We will resume previous losartan dose. He will continue to monitor BP closely at home and will call if any issues.    Follow-Up Keep plans to f/u with Dr. Eldridge Dace for his yearly visit in July.   Brittainy Delmer Islam, MHS [facility] 01/15/2018 12:34 PM

## 2018-01-15 NOTE — Patient Instructions (Signed)
Medication Instructions:  Your physician has recommended you make the following change in your medication: 1.  RESTART the Losartan 100 mg taking 1/2 tablet by mouth twice a day   Labwork: None odered  Testing/Procedures: None ordered  Follow-Up: Your physician recommends that you schedule a follow-up appointment in: 1st AVAILABLE WITH DR. VARANASI   Any Other Special Instructions Will Be Listed Below (If Applicable). STAY HYDRATED  MONITOR YOUR BLOOD PRESSURE AND CALL us IF YOU NEED [place] BEFORE YOUR APPT.    If you need a refill on your cardiac medications before your next appointment, please call your pharmacy.

## 2018-03-02 ENCOUNTER — Other Ambulatory Visit: Payer: Self-pay | Admitting: Interventional Cardiology

## 2018-04-06 ENCOUNTER — Other Ambulatory Visit: Payer: Self-pay | Admitting: Interventional Cardiology

## 2018-04-22 ENCOUNTER — Encounter: Payer: Self-pay | Admitting: Interventional Cardiology

## 2018-04-22 ENCOUNTER — Ambulatory Visit (INDEPENDENT_AMBULATORY_CARE_PROVIDER_SITE_OTHER): Payer: Managed Care, Other (non HMO) | Admitting: Interventional Cardiology

## 2018-04-22 ENCOUNTER — Other Ambulatory Visit: Payer: Self-pay | Admitting: Interventional Cardiology

## 2018-04-22 VITALS — BP 140/80 | HR 64 | Ht 67.0 in | Wt 196.4 lb

## 2018-04-22 DIAGNOSIS — R001 Bradycardia, unspecified: Secondary | ICD-10-CM | POA: Diagnosis not present

## 2018-04-22 DIAGNOSIS — I10 Essential (primary) hypertension: Secondary | ICD-10-CM

## 2018-04-22 DIAGNOSIS — E782 Mixed hyperlipidemia: Secondary | ICD-10-CM | POA: Diagnosis not present

## 2018-04-22 DIAGNOSIS — I25118 Atherosclerotic heart disease of native coronary artery with other forms of angina pectoris: Secondary | ICD-10-CM | POA: Diagnosis not present

## 2018-04-22 DIAGNOSIS — I447 Left bundle-branch block, unspecified: Secondary | ICD-10-CM

## 2018-04-22 LAB — LIPID PANEL
CHOL/HDL RATIO: 2.7 ratio (ref 0.0–5.0)
CHOLESTEROL TOTAL: 136 mg/dL (ref 100–199)
HDL: 50 mg/dL (ref >39)
LDL CALC: 70 mg/dL (ref 0–99)
TRIGLYCERIDES: 79 mg/dL (ref 0–149)
VLDL CHOLESTEROL CAL: 16 mg/dL (ref 5–40)

## 2018-04-22 LAB — COMPREHENSIVE METABOLIC PANEL
A/G RATIO: 1.4 (ref 1.2–2.2)
ALK PHOS: 53 IU/L (ref 39–117)
ALT: 22 IU/L (ref 0–44)
AST: 18 IU/L (ref 0–40)
Albumin: 4.2 g/dL (ref 3.6–4.8)
BILIRUBIN TOTAL: 0.9 mg/dL (ref 0.0–1.2)
BUN/Creatinine Ratio: 17 (ref 10–24)
BUN: 14 mg/dL (ref 8–27)
CHLORIDE: 100 mmol/L (ref 96–106)
CO2: 25 mmol/L (ref 20–29)
Calcium: 9.3 mg/dL (ref 8.6–10.2)
Creatinine, Ser: 0.84 mg/dL (ref 0.76–1.27)
GFR calc Af Amer: 108 mL/min/{1.73_m2} (ref >59)
GFR, EST NON AFRICAN AMERICAN: 94 mL/min/{1.73_m2} (ref >59)
Globulin, Total: 3 g/dL (ref 1.5–4.5)
Glucose: 110 mg/dL — ABNORMAL HIGH (ref 65–99)
POTASSIUM: 4.3 mmol/L (ref 3.5–5.2)
SODIUM: 140 mmol/L (ref 134–144)
Total Protein: 7.2 g/dL (ref 6.0–8.5)

## 2018-04-22 MED ORDER — NITROGLYCERIN 0.4 MG SL SUBL: 0.4000 mg | SUBLINGUAL_TABLET | SUBLINGUAL | 3 refills | Status: DC | PRN

## 2018-04-22 NOTE — Patient Instructions (Signed)
Medication Instructions:  Your physician recommends that you continue on your current medications as directed. Please refer to the Current Medication list given to you today.   Labwork: TODAY: LIPIDS, CMET  Testing/Procedures: None ordered  Follow-Up: Your physician wants you to follow-up in: 1 year with Dr. Eldridge DaceVaranasi. You will receive a reminder letter in the mail two months in advance. If you don't receive a letter, please call our office to schedule the follow-up appointment.   Any Other Special Instructions Will Be Listed Below (If Applicable).     If you need a refill on your cardiac medications before your next appointment, please call your pharmacy.

## 2018-04-22 NOTE — Progress Notes (Signed)
Cardiology Office Note   Date:  04/22/2018   ID:  Bobby, Munoz Jul 09, 1956, MRN [id number]  PCP:  Sigmund Hazel, MD    No chief complaint on file.  CAD, bradycardia  Wt Readings from Last 3 Encounters:  04/22/18 196 lb 6.4 oz (89.1 kg)  01/15/18 196 lb (88.9 kg)  12/29/17 195 lb 9.6 oz (88.7 kg)       History of Present Illness: Bobby Munoz is a 62 y.o. male  has a history of CAD and has undergone previous stenting to the LAD and RCA.  Also has history of hypertension, hyperlipidemia, chronic left bundle branch block and GERD.  He presented to the hospital on December 27, 2017 after having a syncopal event.  The patient reportedly had just returned home from the gym and was getting up from a seated position, took a few steps and felt lightheaded and diaphoretic and subsequently lost consciousness for roughly 2 minutes.  He was brought to the hospital by EMS.  In the ED, he was noted to be hypotensive and bradycardic.  He was also felt to be dehydrated and was given IV fluids.  In retrospect the patient admits that he was not staying well-hydrated.  He had just gone to the gym and also spent some time outdoors without adequate hydration.  His home amlodipine, metoprolol and losartan were all held for hypotension and acute kidney injury.  He was given IV fluids.  His metoprolol was held due to bradycardia.  He was also noticed to have a 3.1-second pause on telemetry.  His bradycardia improved after discontinuation of metoprolol.  Echocardiogram showed normal left ventricular EF, normal wall motion and no significant valvular abnormalities.  He denied chest pain.  Prior to discharge, his amlodipine was resumed.  The patient was discharged home with a 48-hour cardiac monitor.  This showed normal sinus rhythm with an average heart rate of 61 bpm.  Slowest heart rate was 44 bpm but this was in the early morning hours during sleep.  There were no pauses.  He was seen in May 2019 and was doing  well.   BP at home have been in te 130/80-85 range.    Denies : Chest pain. Dizziness. Leg edema. Nitroglycerin use. Orthopnea. Palpitations. Paroxysmal nocturnal dyspnea. Shortness of breath. Syncope.   No bleeding problems. He is trying to eat healthy.  WOuld like to get to 180 lbs.   He is going to the gym several times a week.  He uses the treadmill without problems.    Past Medical History:  Diagnosis Date  . Allergic to cats   . Arthritis    "left shoulder" (04/20/2014)  . Coronary artery disease   . GERD (gastroesophageal reflux disease)   . High cholesterol   . Hypertension   . LBBB (left bundle branch block) 2010  . Other and unspecified angina pectoris   . Pneumonia X 1  . Seasonal allergies    "fall and spring"    Past Surgical History:  Procedure Laterality Date  . CARDIAC CATHETERIZATION  ~ 2000; 03/08/2014  . CARDIAC CATHETERIZATION  04/20/2014   PCI of a chronic total occlusion in the mid right coronary artery.  There were 4 overlapping stents placed   . CARDIAC CATHETERIZATION  04/20/2014   Procedure: LEFT HEART CATH;  Surgeon: Corky Crafts, MD;  Location: [facility] CATH LAB;  Service: Cardiovascular;;  . CARDIOVASCULAR STRESS TEST  2011  . CORONARY ANGIOPLASTY WITH STENT PLACEMENT  03/09/2014; 04/20/2014   "2 + 4"  . LEFT HEART CATHETERIZATION WITH CORONARY ANGIOGRAM N/A 03/08/2014   Procedure: LEFT HEART CATHETERIZATION WITH CORONARY ANGIOGRAM;  Surgeon: Corky CraftsJayadeep S Joon Pohle, MD;  [facility];  Service: Cardiovascular;  Laterality: N/A;  . PERCUTANEOUS CORONARY STENT INTERVENTION (PCI-S) N/A 03/09/2014   Procedure: PERCUTANEOUS CORONARY STENT INTERVENTION (PCI-S);  Surgeon: Corky CraftsJayadeep S Kylynn Street, MD;  [facility];  Service: Cardiovascular;  Laterality: N/A;  . PERCUTANEOUS CORONARY STENT INTERVENTION (PCI-S) N/A 04/20/2014   Procedure: PERCUTANEOUS CORONARY STENT INTERVENTION (PCI-S);  Surgeon: Corky CraftsJayadeep S Shelbee Apgar, MD;  [facility];  Service:  Cardiovascular;  Laterality: N/A;  . TONSILLECTOMY       Current Outpatient Medications  Medication Sig Dispense Refill  . amLODipine (NORVASC) 5 MG tablet TAKE 1 TABLET(5 MG) BY MOUTH DAILY 30 tablet 10  . atorvastatin (LIPITOR) 80 MG tablet TAKE 1 TABLET(80 MG) BY MOUTH DAILY 90 tablet 3  . citalopram (CELEXA) 40 MG tablet Take 40 mg by mouth daily.    . clopidogrel (PLAVIX) 75 MG tablet Take 1 tablet (75 mg total) by mouth daily. Please make yearly appt with Dr. Eldridge DaceVaranasi for July. 1st attempt 30 tablet 3  . ezetimibe (ZETIA) 10 MG tablet Take 1 tablet (10 mg total) by mouth daily. 90 tablet 3  . ibuprofen (ADVIL,MOTRIN) 200 MG tablet Take 400 mg by mouth every 6 (six) hours as needed for headache (pain).    Marland Kitchen. loratadine (CLARITIN) 10 MG tablet Take 10 mg by mouth at bedtime.    . nitroGLYCERIN (NITROSTAT) 0.4 MG SL tablet Place 1 tablet (0.4 mg total) under the tongue every 5 (five) minutes as needed for chest pain (X3 DOSES BEFORE CALLING 911). 25 tablet 3  . pantoprazole (PROTONIX) 40 MG tablet TAKE 1 TABLET(40 MG) BY MOUTH DAILY 30 tablet 1  . losartan (COZAAR) 100 MG tablet Take 0.5 tablets (50 mg total) by mouth 2 (two) times daily. 90 tablet 3   No current facility-administered medications for this visit.     Allergies:   Shellfish allergy and Latex    Social History:  The patient  reports that he has never smoked. He has never used smokeless tobacco. He reports that he drinks alcohol. He reports that he does not use drugs.   Family History:  The patient's family history includes Healthy in his sister and sister; Heart attack in his father; Heart failure in his father; Hypertension in his mother.    ROS:  Please see the history of present illness.   Otherwise, review of systems are positive for difficulty losing weight.   All other systems are reviewed and negative.    PHYSICAL EXAM: VS:  BP 140/80   Pulse 64   Ht 5\' 7"  (1.702 m)   Wt 196 lb 6.4 oz (89.1 kg)   SpO2 97%    BMI 30.76 kg/m  , BMI Body mass index is 30.76 kg/m. GEN: Well nourished, well developed, in no acute distress  HEENT: normal  Neck: no JVD, carotid bruits, or masses Cardiac: RRR; no murmurs, rubs, or gallops,no edema  Respiratory:  clear to auscultation bilaterally, normal work of breathing GI: soft, nontender, nondistended, + BS MS: no deformity or atrophy  Skin: warm and dry, poison ivy on right forearm Neuro:  Strength and sensation are intact Psych: euthymic mood, full affect   EKG:   The ekg ordered in 4/19 demonstrates NSR, LBBB, PVCs   Recent Labs: 12/28/2017: BUN 16; Creatinine, Ser  0.86; Hemoglobin 13.6; Platelets 206; Potassium 3.8; Sodium 138   Lipid Panel    Component Value Date/Time   CHOL 149 03/31/2017 1138   TRIG 173 (H) 03/31/2017 1138   HDL 51 03/31/2017 1138   CHOLHDL 2.9 03/31/2017 1138   CHOLHDL 3.4 02/15/2016 1119   VLDL 31 (H) 02/15/2016 1119   LDLCALC 63 03/31/2017 1138     Other studies Reviewed: Additional studies/ records that were reviewed today with results demonstrating: hospital records reviewed.   ASSESSMENT AND PLAN:  1. CAD: No angina on medical therapy.  Continue aggressive secondary prevention.  Refill nitroglycerin.  LDL target less than 70.  We spoke about regular exercise and healthy diet. 2. Syncope/bradycardia: Chronic  LBBB.  No further episodes.  Holding beta-blocker at this time.  Dehydration was certainly a component of his syncopal episode earlier this year.  Continue to stay well-hydrated. 3. HTN: He should check his blood pressure at home at least several times a month.  It has been well controlled of late.  Continue current medications.  Will check electrolytes given that he did have some acute kidney injury earlier this year. 4. Hyperlipidemia: Check lipids and liver tests today.  Controlled in 2018.   Current medicines are reviewed at length with the patient today.  The patient concerns regarding his medicines were  addressed.  The following changes have been made:  No change  Labs/ tests ordered today include:  No orders of the defined types were placed in this encounter.   Recommend 150 minutes/week of aerobic exercise Low fat, low carb, high fiber diet recommended  Disposition:   FU in 1 year   Signed, Lance Muss, MD  04/22/2018 8:21 AM    [facility] [address] [place], [place], [place]  [address] Phone: [phone]; Fax: [phone]

## 2018-12-15 ENCOUNTER — Telehealth: Payer: Self-pay | Admitting: Radiology

## 2018-12-15 ENCOUNTER — Telehealth: Payer: Self-pay | Admitting: Interventional Cardiology

## 2018-12-15 DIAGNOSIS — R001 Bradycardia, unspecified: Secondary | ICD-10-CM

## 2018-12-15 NOTE — Telephone Encounter (Signed)
Called and spoke to patient. Denies SOB, chest pain, lightheadedness, dizziness, syncope. Made patient aware that we will order a 30 day continuous telemetry monitor. Made patient aware that we will mail the monitor to his house. Address verified. Patient aware that he will receive a call from monitor company to verify insurance. Patient will let us know if his Sx change or worsen.

## 2018-12-15 NOTE — Telephone Encounter (Signed)
Monitor enrolled to be mailed on 3-31

## 2018-12-15 NOTE — Telephone Encounter (Signed)
Can we get a continuous tele monitor on him?

## 2018-12-15 NOTE — Telephone Encounter (Signed)
Patient calling complaining of palpitations, flutters, low HR (30's) and low energy since Sunday. Patient denies any SOB and chest pain. Patient's BP 147/94. Patient has history of A. FIB. Will forward to Dr. Eldridge Dace for advisement.

## 2018-12-15 NOTE — Telephone Encounter (Signed)
Pt called to schedule an appt, but with COVID Precautions, he says he needs to be seen sooner.  Patient c/o Palpitations:  High priority if patient c/o lightheadedness, shortness of breath, or chest pain  1) How long have you had palpitations/irregular HR/ Afib? Are you having the symptoms now? Yes. Been having symptoms since sunday  2) Are you currently experiencing lightheadedness, SOB or CP? no  3) Do you have a history of afib (atrial fibrillation) or irregular heart rhythm? yes  4) Have you checked your BP or HR? (document readings if available):   147/94 HR 37  5) Are you experiencing any other symptoms? No. Just the low heart rate

## 2018-12-15 NOTE — Telephone Encounter (Signed)
Event monitor was enrolled to be mailed to patient on 3/31 due to Covid-19. Patient knows to expect a call from [employer] and she will receive the monitor in 5-7 days 

## 2018-12-17 ENCOUNTER — Ambulatory Visit (INDEPENDENT_AMBULATORY_CARE_PROVIDER_SITE_OTHER): Payer: Managed Care, Other (non HMO)

## 2018-12-17 DIAGNOSIS — R001 Bradycardia, unspecified: Secondary | ICD-10-CM

## 2019-01-05 ENCOUNTER — Telehealth: Payer: Self-pay | Admitting: *Deleted

## 2019-01-05 NOTE — Telephone Encounter (Signed)
Returning call regarding how long you are supposed to wear your cardiac event monitor.  The monitor was ordered for 30 days.  Please use the day you applied your monitor and sent your baseline recording as day 1.

## 2019-01-05 NOTE — Telephone Encounter (Signed)
° ° °  Patient calling, would like to know how long he needs to wear monitor.

## 2019-01-19 ENCOUNTER — Other Ambulatory Visit: Payer: Self-pay

## 2019-01-25 ENCOUNTER — Other Ambulatory Visit: Payer: Self-pay | Admitting: Cardiology

## 2019-01-25 ENCOUNTER — Other Ambulatory Visit: Payer: Self-pay | Admitting: Interventional Cardiology

## 2019-02-21 ENCOUNTER — Other Ambulatory Visit: Payer: Self-pay | Admitting: Cardiology

## 2019-03-17 ENCOUNTER — Other Ambulatory Visit: Payer: Self-pay | Admitting: Interventional Cardiology

## 2019-04-19 ENCOUNTER — Other Ambulatory Visit: Payer: Self-pay | Admitting: Interventional Cardiology

## 2019-04-19 MED ORDER — AMLODIPINE BESYLATE 5 MG PO TABS: 5.0000 mg | ORAL_TABLET | Freq: Every day | ORAL | 0 refills | Status: DC

## 2019-05-26 ENCOUNTER — Other Ambulatory Visit: Payer: Self-pay

## 2019-05-26 ENCOUNTER — Ambulatory Visit: Payer: Managed Care, Other (non HMO) | Admitting: Interventional Cardiology

## 2019-05-26 ENCOUNTER — Encounter: Payer: Self-pay | Admitting: Interventional Cardiology

## 2019-05-26 VITALS — BP 160/100 | HR 66 | Ht 67.0 in | Wt 215.0 lb

## 2019-05-26 DIAGNOSIS — I25118 Atherosclerotic heart disease of native coronary artery with other forms of angina pectoris: Secondary | ICD-10-CM | POA: Diagnosis not present

## 2019-05-26 DIAGNOSIS — I10 Essential (primary) hypertension: Secondary | ICD-10-CM

## 2019-05-26 DIAGNOSIS — E782 Mixed hyperlipidemia: Secondary | ICD-10-CM | POA: Diagnosis not present

## 2019-05-26 DIAGNOSIS — I447 Left bundle-branch block, unspecified: Secondary | ICD-10-CM | POA: Diagnosis not present

## 2019-05-26 MED ORDER — AMLODIPINE BESYLATE 10 MG PO TABS: 10.0000 mg | ORAL_TABLET | Freq: Every day | ORAL | 3 refills | Status: DC

## 2019-05-26 NOTE — Patient Instructions (Addendum)
Medication Instructions:  Your physician has recommended you make the following change in your medication: INCREASE AMLODIPINE TO 10 MG DAILY  If you need a refill on your cardiac medications before your next appointment, please call your pharmacy.   Lab work: None Ordered  If you have labs (blood work) drawn today and your tests are completely normal, you will receive your results only by: Marland Kitchen MyChart Message (if you have MyChart) OR . A paper copy in the mail If you have any lab test that is abnormal or we need to change your treatment, we will call you to review the results.  Testing/Procedures: None Ordered  Follow-Up: At [facility], you and your health needs are our priority.  As part of our continuing mission to provide you with exceptional heart care, we have created designated Provider Care Teams.  These Care Teams include your primary Cardiologist (physician) and Advanced Practice Providers (APPs -  Physician Assistants and Nurse Practitioners) who all work together to provide you with the care you need, when you need it. You will need a follow up appointment in 3 months.  Please call our office 2 months in advance to schedule this appointment.  You may see Larae Grooms, MD or one of the following Advanced Practice Providers on your designated Care Team:   [place], PA-C Melina Copa, PA-C . Ermalinda Barrios, PA-C  Any Other Special Instructions Will Be Listed Below (If Applicable)   Heart-Healthy Eating Plan Heart-healthy meal planning includes:  Eating less unhealthy fats.  Eating more healthy fats.  Making other changes in your diet. Talk with your doctor or a diet specialist (dietitian) to create an eating plan that is right for you. What is my plan? Your doctor may recommend an eating plan that includes:  Total fat: ______% or less of total calories a day.  Saturated fat: ______% or less of total calories a day.  Cholesterol: less than _________mg a  day. What are tips for following this plan? Cooking Avoid frying your food. Try to bake, boil, grill, or broil it instead. You can also reduce fat by:  Removing the skin from poultry.  Removing all visible fats from meats.  Steaming vegetables in water or broth. Meal planning   At meals, divide your plate into four equal parts: ? Fill one-half of your plate with vegetables and green salads. ? Fill one-fourth of your plate with whole grains. ? Fill one-fourth of your plate with lean protein foods.  Eat 4-5 servings of vegetables per day. A serving of vegetables is: ? 1 cup of raw or cooked vegetables. ? 2 cups of raw leafy greens.  Eat 4-5 servings of fruit per day. A serving of fruit is: ? 1 medium whole fruit. ?  cup of dried fruit. ?  cup of fresh, frozen, or canned fruit. ?  cup of 100% fruit juice.  Eat more foods that have soluble fiber. These are apples, broccoli, carrots, beans, peas, and barley. Try to get 20-30 g of fiber per day.  Eat 4-5 servings of nuts, legumes, and seeds per week: ? 1 serving of dried beans or legumes equals  cup after being cooked. ? 1 serving of nuts is  cup. ? 1 serving of seeds equals 1 tablespoon. General information  Eat more home-cooked food. Eat less restaurant, buffet, and fast food.  Limit or avoid alcohol.  Limit foods that are high in starch and sugar.  Avoid fried foods.  Lose weight if you are overweight.  Keep track of how much salt (sodium) you eat. This is important if you have high blood pressure. Ask your doctor to tell you more about this.  Try to add vegetarian meals each week. Fats  Choose healthy fats. These include olive oil and canola oil, flaxseeds, walnuts, almonds, and seeds.  Eat more omega-3 fats. These include salmon, mackerel, sardines, tuna, flaxseed oil, and ground flaxseeds. Try to eat fish at least 2 times each week.  Check food labels. Avoid foods with trans fats or high amounts of  saturated fat.  Limit saturated fats. ? These are often found in animal products, such as meats, butter, and cream. ? These are also found in plant foods, such as palm oil, palm kernel oil, and coconut oil.  Avoid foods with partially hydrogenated oils in them. These have trans fats. Examples are stick margarine, some tub margarines, cookies, crackers, and other baked goods. What foods can I eat? Fruits All fresh, canned (in natural juice), or frozen fruits. Vegetables Fresh or frozen vegetables (raw, steamed, roasted, or grilled). Green salads. Grains Most grains. Choose whole wheat and whole grains most of the time. Rice and pasta, including brown rice and pastas made with whole wheat. Meats and other proteins Lean, well-trimmed beef, veal, pork, and lamb. Chicken and [place] without skin. All fish and shellfish. Wild duck, rabbit, pheasant, and venison. Egg whites or low-cholesterol egg substitutes. Dried beans, peas, lentils, and tofu. Seeds and most nuts. Dairy Low-fat or nonfat cheeses, including ricotta and mozzarella. Skim or 1% milk that is liquid, powdered, or evaporated. Buttermilk that is made with low-fat milk. Nonfat or low-fat yogurt. Fats and oils Non-hydrogenated (trans-free) margarines. Vegetable oils, including soybean, sesame, sunflower, olive, peanut, safflower, corn, canola, and cottonseed. Salad dressings or mayonnaise made with a vegetable oil. Beverages Mineral water. Coffee and tea. Diet carbonated beverages. Sweets and desserts Sherbet, gelatin, and fruit ice. Small amounts of dark chocolate. Limit all sweets and desserts. Seasonings and condiments All seasonings and condiments. The items listed above may not be a complete list of foods and drinks you can eat. Contact a dietitian for more options. What foods should I avoid? Fruits Canned fruit in heavy syrup. Fruit in cream or butter sauce. Fried fruit. Limit coconut. Vegetables Vegetables cooked in cheese,  cream, or butter sauce. Fried vegetables. Grains Breads that are made with saturated or trans fats, oils, or whole milk. Croissants. Sweet rolls. Donuts. High-fat crackers, such as cheese crackers. Meats and other proteins Fatty meats, such as hot dogs, ribs, sausage, bacon, rib-eye roast or steak. High-fat deli meats, such as salami and bologna. Caviar. Domestic duck and goose. Organ meats, such as liver. Dairy Cream, sour cream, cream cheese, and creamed cottage cheese. Whole-milk cheeses. Whole or 2% milk that is liquid, evaporated, or condensed. Whole buttermilk. Cream sauce or high-fat cheese sauce. Yogurt that is made from whole milk. Fats and oils Meat fat, or shortening. Cocoa butter, hydrogenated oils, palm oil, coconut oil, palm kernel oil. Solid fats and shortenings, including bacon fat, salt pork, lard, and butter. Nondairy cream substitutes. Salad dressings with cheese or sour cream. Beverages Regular sodas and juice drinks with added sugar. Sweets and desserts Frosting. Pudding. Cookies. Cakes. Pies. Milk chocolate or white chocolate. Buttered syrups. Full-fat ice cream or ice cream drinks. The items listed above may not be a complete list of foods and drinks to avoid. Contact a dietitian for more information. Summary  Heart-healthy meal planning includes eating less unhealthy fats, eating more healthy  fats, and making other changes in your diet.  Eat a balanced diet. This includes fruits and vegetables, low-fat or nonfat dairy, lean protein, nuts and legumes, whole grains, and heart-healthy oils and fats. This information is not intended to replace advice given to you by your health care provider. Make sure you discuss any questions you have with your health care provider. Document Released: 03/03/2012 Document Revised: 11/06/2017 Document Reviewed: 10/10/2017 Elsevier Patient Education  2020 [employer].

## 2019-05-26 NOTE — Progress Notes (Signed)
Cardiology Office Note   Date:  05/26/2019   ID:  Bobby Munoz, Bobby Munoz 08/16/1956, MRN [id number]  PCP:  Kathyrn Lass, MD    No chief complaint on file.  CAD  Wt Readings from Last 3 Encounters:  05/26/19 215 lb (97.5 kg)  04/22/18 196 lb 6.4 oz (89.1 kg)  01/15/18 196 lb (88.9 kg)       History of Present Illness: Bobby Munoz is a 63 y.o. male   has a history of CAD and has undergone previous stenting to the LAD and RCA in 2015. Also has history of hypertension, hyperlipidemia, chronic left bundle branch block and GERD. He presented to the hospital on December 27, 2017 after having a syncopal event. The patient reportedly had just returned home from the gym and was getting up from a seated position, took a few steps and felt lightheaded and diaphoretic and subsequently lost consciousness for roughly 2 minutes. Hewas brought to the hospital by EMS.In theED,he was noted to be hypotensive and bradycardic. He was also felt to be dehydrated and was given IV fluids. In retrospect the patient admits that he was not staying well-hydrated. He had just gone to the gym and also spent some time outdoors without adequate hydration.  His home amlodipine, metoprolol and losartan were all held for hypotension and acute kidney injury. He was given IV fluids. His metoprolol was held due to bradycardia. He was also noticed to have a 3.1-second pause on telemetry. His bradycardia improved after discontinuation of metoprolol. Echocardiogram showed normal left ventricular EF, normal wall motion and no significant valvular abnormalities. He denied chest pain. Prior to discharge, his amlodipine was resumed. The patient was discharged home with a 48-hour cardiac monitor. This showed normal sinus rhythm with an average heart rate of 61 bpm. Slowest heart rate was 44 bpm but this was in the early morning hours during sleep. There were no pauses.  Since last year, he has gained 20 lbs.  He is  walking more of late, but not going to the gym.    Denies : Chest pain. Dizziness. Leg edema. Nitroglycerin use. Orthopnea. Palpitations. Paroxysmal nocturnal dyspnea. Shortness of breath. Syncope.   Home readings have been in the 409-811 range systolic.   Mother having TAVR in [place].     Past Medical History:  Diagnosis Date  . Allergic to cats   . Arthritis    "left shoulder" (04/20/2014)  . Coronary artery disease   . GERD (gastroesophageal reflux disease)   . High cholesterol   . Hypertension   . LBBB (left bundle branch block) 2010  . Other and unspecified angina pectoris   . Pneumonia X 1  . Seasonal allergies    "fall and spring"    Past Surgical History:  Procedure Laterality Date  . CARDIAC CATHETERIZATION  ~ 2000; 03/08/2014  . CARDIAC CATHETERIZATION  04/20/2014   PCI of a chronic total occlusion in the mid right coronary artery.  There were 4 overlapping stents placed   . CARDIAC CATHETERIZATION  04/20/2014   Procedure: LEFT HEART CATH;  Surgeon: Jettie Booze, MD;  Location[facility] C[facility] LAB;  Service: Cardiovascular;;  . CARDIOVASCULAR STRESS TEST  2011  . CORONARY ANGIOPLASTY WITH STENT PLACEMENT  03/09/2014; 04/20/2014   "2 + 4"  . LEFT HEART CATHETERIZATION WITH CORONARY ANGIOGRAM N/A 03/08/2014   Procedure: LEFT HEART CATHETERIZATION WITH CORONARY ANGIOGRAM;  Surgeon: Jettie Booze, MD;  Location: [facility] CATH LAB;  Service: Cardiovascular;  Laterality:  N/A;  . PERCUTANEOUS CORONARY STENT INTERVENTION (PCI-S) N/A 03/09/2014   Procedure: PERCUTANEOUS CORONARY STENT INTERVENTION (PCI-S);  Surgeon: Corky CraftsJayadeep S Izabella Marcantel, MD;  [facility];  Service: Cardiovascular;  Laterality: N/A;  . PERCUTANEOUS CORONARY STENT INTERVENTION (PCI-S) N/A 04/20/2014   Procedure: PERCUTANEOUS CORONARY STENT INTERVENTION (PCI-S);  Surgeon: Corky CraftsJayadeep S Laquanna Veazey, MD;  [facility];  Service: Cardiovascular;  Laterality: N/A;  . TONSILLECTOMY       Current Outpatient Medications   Medication Sig Dispense Refill  . amLODipine (NORVASC) 5 MG tablet Take 1 tablet (5 mg total) by mouth daily. Please make overdue appt with Dr. Eldridge DaceVaranasi before anymore refills. 2nd attempt 15 tablet 0  . atorvastatin (LIPITOR) 80 MG tablet Take 1 tablet (80 mg total) by mouth daily at 6 PM. Please make annual appt with Dr. Eldridge DaceVaranasi for future refills. Thank you. 1st attempt. 90 tablet 0  . citalopram (CELEXA) 40 MG tablet Take 40 mg by mouth daily.    . clopidogrel (PLAVIX) 75 MG tablet Take 1 tablet (75 mg total) by mouth daily. Please make annual appt with Dr. Eldridge DaceVaranasi for future refills. Thank you. 1st attempt. 90 tablet 0  . ezetimibe (ZETIA) 10 MG tablet Take 1 tablet (10 mg total) by mouth daily. Please make annual appt with Dr. Eldridge DaceVaranasi for future refills. Thank you. 1st attempt. 90 tablet 0  . ibuprofen (ADVIL,MOTRIN) 200 MG tablet Take 400 mg by mouth every 6 (six) hours as needed for headache (pain).    Marland Kitchen. loratadine (CLARITIN) 10 MG tablet Take 10 mg by mouth at bedtime.    Marland Kitchen. losartan (COZAAR) 100 MG tablet TAKE 0.5 BY MOUTH 2 TIMES DAILY. PLEASE MAKE ANNUAL APPT. FOR FUTURE REFILLS THANK YOU 90 tablet 0  . nitroGLYCERIN (NITROSTAT) 0.4 MG SL tablet Place 1 tablet (0.4 mg total) under the tongue every 5 (five) minutes as needed for chest pain (X3 DOSES BEFORE CALLING 911). 25 tablet 3  . pantoprazole (PROTONIX) 40 MG tablet TAKE 1 TABLET(40 MG) BY MOUTH DAILY 30 tablet 1  . traZODone (DESYREL) 50 MG tablet Take 50 mg by mouth at bedtime.     No current facility-administered medications for this visit.     Allergies:   Shellfish allergy and Latex    Social History:  The patient  reports that he has never smoked. He has never used smokeless tobacco. He reports current alcohol use. He reports that he does not use drugs.   Family History:  The patient's family history includes Healthy in his sister and sister; Heart attack in his father; Heart failure in his father; Hypertension in his  mother.    ROS:  Please see the history of present illness.   Otherwise, review of systems are positive for weight gain.   All other systems are reviewed and negative.    PHYSICAL EXAM: VS:  BP (!) 160/100   Pulse 66   Ht 5\' 7"  (1.702 m)   Wt 215 lb (97.5 kg)   SpO2 96%   BMI 33.67 kg/m  , BMI Body mass index is 33.67 kg/m. GEN: Well nourished, well developed, in no acute distress  HEENT: normal  Neck: no JVD, carotid bruits, or masses Cardiac: RRR; no murmurs, rubs, or gallops,no edema  Respiratory:  clear to auscultation bilaterally, normal work of breathing GI: soft, nontender, nondistended, + BS, umbilical hernia MS: no deformity or atrophy  Skin: warm and dry, no rash Neuro:  Strength and sensation are intact Psych: euthymic mood, full  affect   EKG:   The ekg ordered today demonstrates NSR, LBBB, PACs   Recent Labs: No results found for requested labs within last 8760 hours.   Lipid Panel    Component Value Date/Time   CHOL 136 04/22/2018 0839   TRIG 79 04/22/2018 0839   HDL 50 04/22/2018 0839   CHOLHDL 2.7 04/22/2018 0839   CHOLHDL 3.4 02/15/2016 1119   VLDL 31 (H) 02/15/2016 1119   LDLCALC 70 04/22/2018 0839     Other studies Reviewed: Additional studies/ records that were reviewed today with results demonstrating: labs reviewed.    ASSESSMENT AND PLAN:  1. CAD: No angina. Continue aggressive secondary prevention.  2. HTN: Increase amlodipine to 10 mg daily. May improve with some weight loss as well.  3. Hyperlipidemia: Lipids to be checked in 06/2019. 4. LBBB: Chronic.   Current medicines are reviewed at length with the patient today.  The patient concerns regarding his medicines were addressed.  The following changes have been made:  No change  Labs/ tests ordered today include:  No orders of the defined types were placed in this encounter.   Recommend 150 minutes/week of aerobic exercise Low fat, low carb, high fiber diet recommended   Disposition:   FU in 3 months- video visit   Signed, Lance Muss, MD  05/26/2019 12:08 PM    [facility] [address] [place], [place], [place]  [address] Phone: [phone]; Fax: [phone]

## 2019-06-07 ENCOUNTER — Other Ambulatory Visit: Payer: Self-pay | Admitting: Interventional Cardiology

## 2019-06-10 ENCOUNTER — Other Ambulatory Visit: Payer: Self-pay | Admitting: Interventional Cardiology

## 2019-06-10 MED ORDER — CLOPIDOGREL BISULFATE 75 MG PO TABS: 75.0000 mg | ORAL_TABLET | Freq: Every day | ORAL | 3 refills | Status: DC

## 2019-07-29 ENCOUNTER — Other Ambulatory Visit: Payer: Self-pay | Admitting: Interventional Cardiology

## 2019-07-29 MED ORDER — EZETIMIBE 10 MG PO TABS: 10.0000 mg | ORAL_TABLET | Freq: Every day | ORAL | 3 refills | Status: DC

## 2019-08-23 ENCOUNTER — Other Ambulatory Visit: Payer: Self-pay | Admitting: Interventional Cardiology

## 2019-08-23 MED ORDER — ATORVASTATIN CALCIUM 80 MG PO TABS: 80.0000 mg | ORAL_TABLET | Freq: Every day | ORAL | 2 refills | Status: DC

## 2019-08-27 ENCOUNTER — Telehealth: Payer: Managed Care, Other (non HMO) | Admitting: Interventional Cardiology

## 2019-09-28 ENCOUNTER — Other Ambulatory Visit: Payer: Self-pay | Admitting: Interventional Cardiology

## 2019-09-28 MED ORDER — LOSARTAN POTASSIUM 100 MG PO TABS: 50.0000 mg | ORAL_TABLET | Freq: Two times a day (BID) | ORAL | 2 refills | Status: DC

## 2019-11-20 ENCOUNTER — Ambulatory Visit: Payer: Managed Care, Other (non HMO) | Attending: Internal Medicine

## 2019-11-20 DIAGNOSIS — Z23 Encounter for immunization: Secondary | ICD-10-CM

## 2019-11-20 NOTE — Progress Notes (Signed)
   Covid-19 Vaccination Clinic  Name:  Bobby Munoz    MRN: [id number] DOB: 17-Jul-1956  11/20/2019  Mr. Blades was observed post Covid-19 immunization for 15 minutes without incident. He was provided with Vaccine Information Sheet and instruction to access the V-Safe system.   [name] was instructed to call 911 with any severe reactions post vaccine: Marland Kitchen Difficulty breathing  . Swelling of face and throat  . A fast heartbeat  . A bad rash all over body  . Dizziness and weakness   Immunizations Administered    Name Date Dose VIS Date Route   Pfizer COVID-19 Vaccine 11/20/2019  9:37 AM 0.3 mL 08/27/2019 Intramuscular   Manufacturer: [employer], [employer]   Lot: [id number]   NDC: [address]-4

## 2019-12-11 ENCOUNTER — Ambulatory Visit: Payer: Managed Care, Other (non HMO) | Attending: Internal Medicine

## 2019-12-11 DIAGNOSIS — Z23 Encounter for immunization: Secondary | ICD-10-CM

## 2019-12-11 NOTE — Progress Notes (Signed)
   Covid-19 Vaccination Clinic  Name:  RUXIN RANSOME    MRN: [id number] DOB: 1956/07/10  12/11/2019  Mr. Sheley was observed post Covid-19 immunization for 15 minutes without incident. He was provided with Vaccine Information Sheet and instruction to access the V-Safe system.   Mr. Millirons was instructed to call 911 with any severe reactions post vaccine: Marland Kitchen Difficulty breathing  . Swelling of face and throat  . A fast heartbeat  . A bad rash all over body  . Dizziness and weakness   Immunizations Administered    Name Date Dose VIS Date Route   Pfizer COVID-19 Vaccine 12/11/2019  9:34 AM 0.3 mL 08/27/2019 Intramuscular   Manufacturer: [employer], [employer]   Lot: [id number]   NDC: [address]-2

## 2020-04-13 ENCOUNTER — Telehealth: Payer: Self-pay | Admitting: *Deleted

## 2020-04-13 NOTE — Telephone Encounter (Signed)
   [facility] Pre-operative Risk Assessment    HEARTCARE STAFF: - Please ensure there is not already an duplicate clearance open for this procedure. - Under Visit Info/Reason for Call, type in Other and utilize the format Clearance MM/DD/YY or Clearance TBD. Do not use dashes or single digits. - If request is for dental extraction, please clarify the # of teeth to be extracted.  Request for surgical clearance:  1. What type of surgery is being performed? L3-4 TFESI   2. When is this surgery scheduled? TBD   3. What type of clearance is required (medical clearance vs. Pharmacy clearance to hold med vs. Both)? MEDICAL  4. Are there any medications that need to be held prior to surgery and how long? PLAVIX x 3 DAYS PRIOR TO INJECTION  5. Practice name and name of physician performing surgery? [facility]; DR. Doreatha Martin   6. What is the office phone number? [phone]   7.   What is the office fax number? [phone] ATTN: X-RAY  8.   Anesthesia type (None, local, MAC, general) ? NONE LISTED   Bobby Munoz 04/13/2020, 5:22 PM  _________________________________________________________________   (provider comments below)

## 2020-04-14 NOTE — Telephone Encounter (Signed)
Primary Cardiologist:Jayadeep Eldridge Dace, MD  Chart reviewed as part of pre-operative protocol coverage. Because of Roshan Roback Landin's past medical history and time since last visit, he/she will require a follow-up visit in order to better assess preoperative cardiovascular risk.  Pre-op covering staff: - Please schedule appointment and call patient to inform them. - Please contact requesting surgeon's office via preferred method (i.e, phone, fax) to inform them of need for appointment prior to surgery.  If applicable, this message will also be routed to pharmacy pool and/or primary cardiologist for input on holding anticoagulant/antiplatelet agent as requested below so that this information is available at time of patient's appointment.   Ronney Asters, NP  04/14/2020, 1:51 PM

## 2020-04-17 ENCOUNTER — Telehealth: Payer: Self-pay | Admitting: Interventional Cardiology

## 2020-04-17 NOTE — Telephone Encounter (Signed)
Diannia Ruder from [facility] was calling to check on the status of this clearance request. Please fax clearance to [phone]. She would liek to get the patient scheduled for his injection asap.

## 2020-04-17 NOTE — Telephone Encounter (Signed)
See phone note from today for the operator. Pt called and asking about his clearance from Dr. Eldridge Dace for his upcoming procedure. I called and s/w the pt and assured him that we do have a message out to Dr.Varanasi in regards to recommendations for holding Plavix before his procedure. I did also inform the pt that he is going to need an appt for pre op clearance. I reviewed [facility] schedules and nothing available and I had viewed up until 05/09/20. Pt said he does not want to wait that long. I then gave the option he could be seen in the [facility] office if is agreeable. Pt opted for the [facility] office. I have scheduled the pt to see Edd Fabian, FNP 04/21/20 @ 8:45. I reminded the pt that this appt will be at the [facility] office over at [facility]. Pt thanked me for the call. I will forward clearance notes to FNP for upcoming appt.

## 2020-04-17 NOTE — Telephone Encounter (Signed)
See clearance notes  

## 2020-04-17 NOTE — Telephone Encounter (Signed)
Pt is calling because he stated that he needed Dr. Hoyle Barr permission to get an injection for a spinal lumbar. Please call back

## 2020-04-17 NOTE — Telephone Encounter (Signed)
° °  Primary Cardiologist:Jayadeep Eldridge Dace, MD  Chart reviewed as part of pre-operative protocol coverage. Because of Tony Granquist Fetting's past medical history and time since last visit, they will require a follow-up visit in order to better assess preoperative cardiovascular risk. He was last seen 05/2019 and recommended for 3 month follow up.   I will route this to the requesting office to make them aware.   I will also route to Dr. Eldridge Dace for his input on holding Plavix 3 days prior.   Alver Sorrow, NP  04/17/2020, 11:37 AM

## 2020-04-20 NOTE — Progress Notes (Signed)
Cardiology Office Note   Date:  04/21/2020   ID:  Bobby, Munoz 06/15/1956, MRN [id number]  PCP:  Sigmund Hazel, MD  Cardiologist: Dr. Eldridge Dace  No chief complaint on file.    History of Present Illness: Bobby Munoz is a 64 y.o. male who presents for preoperative evaluation to have L3-4 TFESI with Delbert Harness, Dr. Aviva Signs.  Bobby Munoz has a history of coronary artery disease with previous stenting to the LAD and RCA in 2015, hypertension, hyperlipidemia, chronic left bundle branch block, and GERD.  He did have a syncopal episode in December 27, 2017 after feeling lightheaded and diaphoretic with LOC after finishing a workout at the gym.  It was felt to be related to dehydration as he was hypotensive.  He was also found to be bradycardic.  At that time his amlodipine metoprolol and losartan were all held due to hypotension and acute kidney injury.  He was given IV fluids.  Metoprolol was held due to bradycardia.  He was noted to have a 3.1-second pause on teleme  On last office visit with Dr. Eldridge Dace dated 05/26/2019, he was doing well, he was to continue on aggressive secondary prevention.  He was hypertensive and amlodipine was increased to 10 mg daily.  It was to remain on Plavix 75 mg daily.  He comes today without any complaints of chest pain, dyspnea on exertion, fatigue, or near syncope.  His main complaint is his back pain and being unable to exercise causing him to gain weight.  He denies any bleeding on Plavix.  He is medically compliant.  His primary care physician is managing his labs and lipids.  Past Medical History:  Diagnosis Date  . Allergic to cats   . Arthritis    "left shoulder" (04/20/2014)  . Coronary artery disease   . GERD (gastroesophageal reflux disease)   . High cholesterol   . Hypertension   . LBBB (left bundle branch block) 2010  . Other and unspecified angina pectoris   . Pneumonia X 1  . Seasonal allergies    "fall and spring"    Past Surgical  History:  Procedure Laterality Date  . CARDIAC CATHETERIZATION  ~ 2000; 03/08/2014  . CARDIAC CATHETERIZATION  04/20/2014   PCI of a chronic total occlusion in the mid right coronary artery.  There were 4 overlapping stents placed   . CARDIAC CATHETERIZATION  04/20/2014   Procedure: LEFT HEART CATH;  Surgeon: Corky Crafts, MD;  Location: [facility] CATH LAB;  Service: Cardiovascular;;  . CARDIOVASCULAR STRESS TEST  2011  . CORONARY ANGIOPLASTY WITH STENT PLACEMENT  03/09/2014; 04/20/2014   "2 + 4"  . LEFT HEART CATHETERIZATION WITH CORONARY ANGIOGRAM N/A 03/08/2014   Procedure: LEFT HEART CATHETERIZATION WITH CORONARY ANGIOGRAM;  Surgeon: Corky Crafts, MD;  Location: [facility] CATH LAB;  Service: Cardiovascular;  Laterality: N/A;  . PERCUTANEOUS CORONARY STENT INTERVENTION (PCI-S) N/A 03/09/2014   Procedure: PERCUTANEOUS CORONARY STENT INTERVENTION (PCI-S);  Surgeon: Corky Crafts, MD;  Location: [facility] CATH LAB;  Service: Cardiovascular;  Laterality: N/A;  . PERCUTANEOUS CORONARY STENT INTERVENTION (PCI-S) N/A 04/20/2014   Procedure: PERCUTANEOUS CORONARY STENT INTERVENTION (PCI-S);  Surgeon: Corky Crafts, MD;  Location: [facility] CATH LAB;  Service: Cardiovascular;  Laterality: N/A;  . TONSILLECTOMY       Current Outpatient Medications  Medication Sig Dispense Refill  . atorvastatin (LIPITOR) 80 MG tablet Take 1 tablet (80 mg total) by mouth daily at 6 PM. 90 tablet 2  .  citalopram (CELEXA) 40 MG tablet Take 40 mg by mouth daily.    . clopidogrel (PLAVIX) 75 MG tablet Take 1 tablet (75 mg total) by mouth daily. 90 tablet 3  . ezetimibe (ZETIA) 10 MG tablet Take 1 tablet (10 mg total) by mouth daily. 90 tablet 3  . gabapentin (NEURONTIN) 300 MG capsule Take 300 mg by mouth.    . loratadine (CLARITIN) 10 MG tablet Take 10 mg by mouth at bedtime.    Marland Kitchen losartan (COZAAR) 100 MG tablet Take 0.5 tablets (50 mg total) by mouth 2 (two) times daily. 90 tablet 2  . nitroGLYCERIN (NITROSTAT) 0.4 MG SL tablet  Place 1 tablet (0.4 mg total) under the tongue every 5 (five) minutes as needed for chest pain (X3 DOSES BEFORE CALLING 911). 25 tablet 3  . pantoprazole (PROTONIX) 40 MG tablet TAKE 1 TABLET(40 MG) BY MOUTH DAILY 30 tablet 1  . traZODone (DESYREL) 50 MG tablet Take 50 mg by mouth at bedtime.    Marland Kitchen amLODipine (NORVASC) 10 MG tablet Take 1 tablet (10 mg total) by mouth daily. 180 tablet 3   No current facility-administered medications for this visit.    Allergies:   Shellfish allergy and Latex    Social History:  The patient  reports that he has never smoked. He has never used smokeless tobacco. He reports current alcohol use. He reports that he does not use drugs.   Family History:  The patient's family history includes Healthy in his sister and sister; Heart attack in his father; Heart failure in his father; Hypertension in his mother.    ROS: All other systems are reviewed and negative. Unless otherwise mentioned in H&P    PHYSICAL EXAM: VS:  BP (!) 142/98   Pulse 81   Ht 5\' 6"  (1.676 m)   Wt 205 lb 6.4 oz (93.2 kg)   BMI 33.15 kg/m  , BMI Body mass index is 33.15 kg/m. GEN: Well nourished, well developed, in no acute distress HEENT: normal Neck: no JVD, carotid bruits, or masses Cardiac: RRR, bradycardic; no murmurs, rubs, or gallops,no edema  Respiratory:  Clear to auscultation bilaterally, normal work of breathing GI: soft, nontender, nondistended, + BS MS: no deformity or atrophy, pain with movement of torso and sitting up from a lying position Skin: warm and dry, no rash Neuro:  Strength and sensation are intact Psych: euthymic mood, full affect   EKG: Sinus bradycardia with PVCs, left atrial enlargement, left axis deviation, left bundle branch block (personally reviewed) unchanged from prior EKG in 2020 with the exception of PVCs  Recent Labs: No results found for requested labs within last 8760 hours.    Lipid Panel    Component Value Date/Time   CHOL 136  04/22/2018 0839   TRIG 79 04/22/2018 0839   HDL 50 04/22/2018 0839   CHOLHDL 2.7 04/22/2018 0839   CHOLHDL 3.4 02/15/2016 1119   VLDL 31 (H) 02/15/2016 1119   LDLCALC 70 04/22/2018 0839      Wt Readings from Last 3 Encounters:  04/21/20 205 lb 6.4 oz (93.2 kg)  05/26/19 215 lb (97.5 kg)  04/22/18 196 lb 6.4 oz (89.1 kg)      Other studies Reviewed: LHC 2015 SP successful PCI of a chronic total occlusion in the mid right coronary artery. There were 4 overlapping stents placed as noted above; all of the stents were drug-eluting. LVEDP 18 mmHg.   Echocardiogram:  12/28/2017 Left ventricle: The cavity size was normal. Systolic function was  normal. The estimated ejection fraction was in the range of 55%  to 60%. Wall motion was normal; there were no regional wall  motion abnormalities. Left ventricular diastolic function  parameters were normal.  - Aortic valve: Transvalvular velocity was within the normal range.  There was no stenosis. There was no regurgitation.  - Mitral valve: Transvalvular velocity was within the normal range.  There was no evidence for stenosis. There was mild regurgitation.  Valve area by pressure half-time: 1.68 cm^2.  - Right ventricle: The cavity size was normal. Wall thickness was  normal. Systolic function was normal.  - Atrial septum: No defect or patent foramen ovale was identified  by color flow Doppler.  - Tricuspid valve: There was trivial regurgitation.  - Pulmonary arteries: Systolic pressure was within the normal  range. PA peak pressure: 28 mm Hg (S).   ASSESSMENT AND PLAN:  1. Pre-Operative Cardiac Clearance:     Chart reviewed as part of pre-operative protocol coverage. Given past medical history and time since last visit, based on ACC/AHA guidelines, Bobby Munoz would be at acceptable risk for the planned procedure without further cardiovascular testing. Hold Plavix for 5 days and begin the following day.  He is  aware of need to stop dose now.  I will route this recommendation to the requesting party via Epic fax function and remove from pre-op pool.  Please call with questions.  Bettey Mare. Herold Salguero DNP, ANP, AACC  04/21/2020, 8:56 AM  Current medicines are reviewed at length with the patient today.  I have spent 20 minutes dedicated to the care of this patient on the date of this encounter to include pre-visit review of records, assessment, management and diagnostic testing,with shared decision making.  Labs/ tests ordered today include: None  Bettey Mare. Liborio Nixon, ANP, AACC   04/21/2020 8:54 AM    [facility] [address] Office [phone] Fax [phone]  Notice: This dictation was prepared with Dragon dictation along with smaller phrase technology. Any transcriptional errors that result from this process are unintentional and may not be corrected upon review.

## 2020-04-21 ENCOUNTER — Encounter: Payer: Self-pay | Admitting: Adult Health

## 2020-04-21 ENCOUNTER — Ambulatory Visit (INDEPENDENT_AMBULATORY_CARE_PROVIDER_SITE_OTHER): Payer: Managed Care, Other (non HMO) | Admitting: Adult Health

## 2020-04-21 ENCOUNTER — Other Ambulatory Visit: Payer: Self-pay

## 2020-04-21 VITALS — BP 142/98 | HR 81 | Ht 66.0 in | Wt 205.4 lb

## 2020-04-21 DIAGNOSIS — E782 Mixed hyperlipidemia: Secondary | ICD-10-CM

## 2020-04-21 DIAGNOSIS — I10 Essential (primary) hypertension: Secondary | ICD-10-CM

## 2020-04-21 DIAGNOSIS — Z01810 Encounter for preprocedural cardiovascular examination: Secondary | ICD-10-CM | POA: Diagnosis not present

## 2020-04-21 DIAGNOSIS — R001 Bradycardia, unspecified: Secondary | ICD-10-CM

## 2020-04-21 DIAGNOSIS — I2510 Atherosclerotic heart disease of native coronary artery without angina pectoris: Secondary | ICD-10-CM

## 2020-04-21 NOTE — Telephone Encounter (Signed)
Diannia Ruder from [employer] calling requesting the patient's clearance be faxed to their office with ATTN: Diannia Ruder

## 2020-04-21 NOTE — Patient Instructions (Signed)
Medication Instructions:  HOLD- Plavix starting today and restart after your procedure on Tuesday  *If you need a refill on your cardiac medications before your next appointment, please call your pharmacy*   Lab Work: None Ordered   Testing/Procedures: None Ordered   Follow-Up: At [facility], you and your health needs are our priority.  As part of our continuing mission to provide you with exceptional heart care, we have created designated Provider Care Teams.  These Care Teams include your primary Cardiologist (physician) and Advanced Practice Providers (APPs -  Physician Assistants and Nurse Practitioners) who all work together to provide you with the care you need, when you need it.  We recommend signing up for the patient portal called "MyChart".  Sign up information is provided on this After Visit Summary.  MyChart is used to connect with patients for Virtual Visits (Telemedicine).  Patients are able to view lab/test results, encounter notes, upcoming appointments, etc.  Non-urgent messages can be sent to your provider as well.   To learn more about what you can do with MyChart, go to [email or web address].    Your next appointment:   1 year(s)  The format for your next appointment:   In Person  Provider:   You may see Lance Muss, MD or one of the following Advanced Practice Providers on your designated Care Team:    Ronie Spies, PA-C  Jacolyn Reedy, PA-C

## 2020-04-21 NOTE — Telephone Encounter (Signed)
Error

## 2020-04-21 NOTE — Telephone Encounter (Signed)
Follow up   Diannia Ruder called back would like to clarify the number of days pt need to hold plavix. She said they received [name] Lawrence's notes from pt's visit today and she wants the pt to hold plavix for 5 days. She wanted to clarify is it 3 or 5 days. She is leaving for the day, she said someone can call her back on Monday

## 2020-04-21 NOTE — Telephone Encounter (Signed)
Office note faxed to [employer]

## 2020-04-21 NOTE — Telephone Encounter (Signed)
Patient called stating that since the original clearance was done for 5 days off of Plavix but [name] states 3 days is ok - he states that Bobby Munoz needs a new approval for 3-5 days off of Plavix. He did advise that the spinal injection is not scheduled yet, but they have an availability for this Wednesday, if they aren't able to get this new approval in in time then it will be much later. Please advise.

## 2020-04-22 NOTE — Telephone Encounter (Signed)
OK to hold the Plavix for 3-5 days before spinal injection.    JV

## 2020-04-24 NOTE — Telephone Encounter (Signed)
   Primary Cardiologist: Lance Muss, MD  Chart reviewed as part of pre-operative protocol coverage. Given past medical history and time since last visit, based on ACC/AHA guidelines, Bobby Munoz would be at acceptable risk for the planned procedure without further cardiovascular testing.   His Plavix may be held for 3-5 days prior to his spinal injection.  Please resume as soon as hemostasis is achieved.  I will route this recommendation to the requesting party via Epic fax function and remove from pre-op pool.  Please call with questions.  Thomasene Ripple. Mellany Dinsmore NP-C    04/24/2020, 8:27 AM [facility] [address] Office [phone] Fax [phone]

## 2020-04-24 NOTE — Addendum Note (Signed)
Addended by: Brunetta Genera on: 04/24/2020 03:13 PM   Modules accepted: Orders

## 2020-05-13 ENCOUNTER — Other Ambulatory Visit: Payer: Self-pay | Admitting: Interventional Cardiology

## 2020-06-06 ENCOUNTER — Other Ambulatory Visit: Payer: Self-pay | Admitting: Interventional Cardiology

## 2020-06-19 ENCOUNTER — Other Ambulatory Visit: Payer: Self-pay

## 2020-06-19 MED ORDER — LOSARTAN POTASSIUM 100 MG PO TABS: 50.0000 mg | ORAL_TABLET | Freq: Two times a day (BID) | ORAL | 2 refills | Status: DC

## 2020-07-03 ENCOUNTER — Other Ambulatory Visit: Payer: Self-pay | Admitting: Interventional Cardiology

## 2020-08-06 ENCOUNTER — Other Ambulatory Visit: Payer: Self-pay | Admitting: Interventional Cardiology

## 2020-08-07 NOTE — Telephone Encounter (Signed)
rx refill

## 2020-09-16 HISTORY — PX: CATARACT EXTRACTION W/ INTRAOCULAR LENS IMPLANT: SHX1309

## 2020-09-20 ENCOUNTER — Telehealth: Payer: Self-pay | Admitting: *Deleted

## 2020-09-20 NOTE — Telephone Encounter (Signed)
Left message to call back.  He was previously cleared to hold Plavix 3-5 days pre spinal injection so those would be our recommendations this time as well.   Corine Shelter PA-C 09/20/2020 3:35 PM

## 2020-09-20 NOTE — Telephone Encounter (Signed)
   [facility] Pre-operative Risk Assessment    HEARTCARE STAFF: - Please ensure there is not already an duplicate clearance open for this procedure. - Under Visit Info/Reason for Call, type in Other and utilize the format Clearance MM/DD/YY or Clearance TBD. Do not use dashes or single digits. - If request is for dental extraction, please clarify the # of teeth to be extracted.  Request for surgical clearance:  1. What type of surgery is being performed? RADIOFREQUENCY ABLATION SPINAL INJECTION  2. When is this surgery scheduled? TBD   3. What type of clearance is required (medical clearance vs. Pharmacy clearance to hold med vs. Both)? MEDICAL  4. Are there any medications that need to be held prior to surgery and how long? PLAVIX x 7 DAYS PRIOR TO PROCEDURE  5. Practice name and name of physician performing surgery? [facility]; DR. Lake Bells IBAZEBO   6. What is the office phone number? [phone]   7.   What is the office fax number? [phone] ATTN: X-RAY  8.   Anesthesia type (None, local, MAC, general) ? NOT LISTED   Julaine Hua 09/20/2020, 2:30 PM  _________________________________________________________________   (provider comments below)

## 2020-09-20 NOTE — Telephone Encounter (Signed)
   Primary Cardiologist: Lance Muss, MD  Chart reviewed and patient contacted today by phone as part of pre-operative protocol coverage. Given past medical history and time since last visit, based on ACC/AHA guidelines, Bobby Munoz would be at acceptable risk for the planned procedure without further cardiovascular testing.   Per Dr Eldridge Dace- OK to hold Plavix 5 days pre op, resume as soon as safe post op.   The patient was advised that if he develops new symptoms prior to surgery to contact our office to arrange for a follow-up visit, and he verbalized understanding.  I will route this recommendation to the requesting party via Epic fax function and remove from pre-op pool.  Please call with questions.  Corine Shelter, PA-C 09/20/2020, 4:00 PM

## 2020-09-20 NOTE — Telephone Encounter (Signed)
Bobby Munoz is returning Luke's call. Please advise.

## 2020-11-22 ENCOUNTER — Ambulatory Visit (INDEPENDENT_AMBULATORY_CARE_PROVIDER_SITE_OTHER): Payer: Managed Care, Other (non HMO) | Admitting: Family Medicine

## 2020-11-22 ENCOUNTER — Other Ambulatory Visit: Payer: Self-pay

## 2020-11-22 ENCOUNTER — Encounter (INDEPENDENT_AMBULATORY_CARE_PROVIDER_SITE_OTHER): Payer: Self-pay | Admitting: Family Medicine

## 2020-11-22 VITALS — BP 125/79 | HR 66 | Temp 98.3°F | Ht 66.0 in | Wt 206.0 lb

## 2020-11-22 DIAGNOSIS — E669 Obesity, unspecified: Secondary | ICD-10-CM

## 2020-11-22 DIAGNOSIS — Z6833 Body mass index (BMI) 33.0-33.9, adult: Secondary | ICD-10-CM

## 2020-11-22 DIAGNOSIS — R739 Hyperglycemia, unspecified: Secondary | ICD-10-CM

## 2020-11-22 DIAGNOSIS — Z0289 Encounter for other administrative examinations: Secondary | ICD-10-CM

## 2020-11-22 DIAGNOSIS — I10 Essential (primary) hypertension: Secondary | ICD-10-CM | POA: Diagnosis not present

## 2020-11-22 DIAGNOSIS — Z1331 Encounter for screening for depression: Secondary | ICD-10-CM | POA: Diagnosis not present

## 2020-11-22 DIAGNOSIS — Z9189 Other specified personal risk factors, not elsewhere classified: Secondary | ICD-10-CM

## 2020-11-22 DIAGNOSIS — E782 Mixed hyperlipidemia: Secondary | ICD-10-CM | POA: Diagnosis not present

## 2020-11-22 DIAGNOSIS — R0602 Shortness of breath: Secondary | ICD-10-CM

## 2020-11-22 DIAGNOSIS — R5383 Other fatigue: Secondary | ICD-10-CM

## 2020-11-23 LAB — T4: T4, Total: 6 ug/dL (ref 4.5–12.0)

## 2020-11-23 LAB — CBC WITH DIFFERENTIAL/PLATELET
Basophils Absolute: 0.1 10*3/uL (ref 0.0–0.2)
Basos: 1 %
EOS (ABSOLUTE): 0.1 10*3/uL (ref 0.0–0.4)
Eos: 1 %
Hematocrit: 46.6 % (ref 37.5–51.0)
Hemoglobin: 15.3 g/dL (ref 13.0–17.7)
Immature Grans (Abs): 0.1 10*3/uL (ref 0.0–0.1)
Immature Granulocytes: 1 %
Lymphocytes Absolute: 2.2 10*3/uL (ref 0.7–3.1)
Lymphs: 22 %
MCH: 31.7 pg (ref 26.6–33.0)
MCHC: 32.8 g/dL (ref 31.5–35.7)
MCV: 97 fL (ref 79–97)
Monocytes Absolute: 0.8 10*3/uL (ref 0.1–0.9)
Monocytes: 8 %
Neutrophils Absolute: 6.6 10*3/uL (ref 1.4–7.0)
Neutrophils: 67 %
Platelets: 282 10*3/uL (ref 150–450)
RBC: 4.82 x10E6/uL (ref 4.14–5.80)
RDW: 12.3 % (ref 11.6–15.4)
WBC: 9.8 10*3/uL (ref 3.4–10.8)

## 2020-11-23 LAB — COMPREHENSIVE METABOLIC PANEL
ALT: 38 IU/L (ref 0–44)
AST: 31 IU/L (ref 0–40)
Albumin/Globulin Ratio: 1.5 (ref 1.2–2.2)
Albumin: 4.7 g/dL (ref 3.8–4.8)
Alkaline Phosphatase: 65 IU/L (ref 44–121)
BUN/Creatinine Ratio: 16 (ref 10–24)
BUN: 14 mg/dL (ref 8–27)
Bilirubin Total: 0.8 mg/dL (ref 0.0–1.2)
CO2: 25 mmol/L (ref 20–29)
Calcium: 9.8 mg/dL (ref 8.6–10.2)
Chloride: 101 mmol/L (ref 96–106)
Creatinine, Ser: 0.86 mg/dL (ref 0.76–1.27)
Globulin, Total: 3.1 g/dL (ref 1.5–4.5)
Glucose: 131 mg/dL — ABNORMAL HIGH (ref 65–99)
Potassium: 4.5 mmol/L (ref 3.5–5.2)
Sodium: 142 mmol/L (ref 134–144)
Total Protein: 7.8 g/dL (ref 6.0–8.5)
eGFR: 96 mL/min/{1.73_m2} (ref >59)

## 2020-11-23 LAB — LIPID PANEL WITH LDL/HDL RATIO
Cholesterol, Total: 177 mg/dL (ref 100–199)
HDL: 57 mg/dL (ref >39)
LDL Chol Calc (NIH): 93 mg/dL (ref 0–99)
LDL/HDL Ratio: 1.6 ratio (ref 0.0–3.6)
Triglycerides: 155 mg/dL — ABNORMAL HIGH (ref 0–149)
VLDL Cholesterol Cal: 27 mg/dL (ref 5–40)

## 2020-11-23 LAB — TSH: TSH: 2.37 u[IU]/mL (ref 0.450–4.500)

## 2020-11-23 LAB — HEMOGLOBIN A1C
Est. average glucose Bld gHb Est-mCnc: 128 mg/dL
Hgb A1c MFr Bld: 6.1 % — ABNORMAL HIGH (ref 4.8–5.6)

## 2020-11-23 LAB — FOLATE: Folate: 5 ng/mL (ref >3.0)

## 2020-11-23 LAB — INSULIN, RANDOM: INSULIN: 16.6 u[IU]/mL (ref 2.6–24.9)

## 2020-11-23 LAB — T3: T3, Total: 108 ng/dL (ref 71–180)

## 2020-11-23 LAB — VITAMIN D 25 HYDROXY (VIT D DEFICIENCY, FRACTURES): Vit D, 25-Hydroxy: 14.3 ng/mL — ABNORMAL LOW (ref 30.0–100.0)

## 2020-11-23 LAB — VITAMIN B12: Vitamin B-12: 382 pg/mL (ref 232–1245)

## 2020-11-28 NOTE — Progress Notes (Signed)
Chief Complaint:   OBESITY Bobby Munoz (MR# [id number]) is a 65 y.o. male who presents for evaluation and treatment of obesity and related comorbidities. Current BMI is Body mass index is 33.25 kg/m. Bobby Munoz has been struggling with his weight for many years and has been unsuccessful in either losing weight, maintaining weight loss, or reaching his healthy weight goal.  Bobby Munoz is currently in the action stage of change and ready to dedicate time achieving and maintaining a healthier weight. Bobby Munoz is interested in becoming our patient and working on intensive lifestyle modifications including (but not limited to) diet and exercise for weight loss.  Bobby Munoz's habits were reviewed today and are as follows: His family eats meals together, he thinks his family will eat healthier with him, his desired weight loss is 36 lbs, he started gaining weight in the past 5 years, his heaviest weight ever was 210 pounds, he has significant food cravings issues, he snacks frequently in the evenings, he skips meals frequently, he is frequently drinking liquids with calories, he frequently eats larger portions than normal and he struggles with emotional eating.  Depression Screen Bobby Munoz's Food and Mood (modified PHQ-9) score was 8.  Depression screen PHQ 2/9 11/22/2020  Decreased Interest 1  Down, Depressed, Hopeless 2  PHQ - 2 Score 3  Altered sleeping 0  Tired, decreased energy 2  Change in appetite 1  Feeling bad or failure about yourself  1  Trouble concentrating 0  Moving slowly or fidgety/restless 1  Suicidal thoughts 0  PHQ-9 Score 8  Difficult doing work/chores Somewhat difficult   Subjective:   1. Other fatigue Bobby Munoz admits to daytime somnolence and admits to waking up still tired. Patent has a history of symptoms of daytime fatigue. Bobby Munoz generally gets 7 hours of sleep per night, and states that he has generally restful sleep. Snoring is present. Apneic episodes are not present. Epworth  Sleepiness Score is 7.  2. Shortness of breath on exertion Bobby Munoz notes increasing shortness of breath with exercising and seems to be worsening over time with weight gain. He notes getting out of breath sooner with activity than he used to. This has not gotten worse recently. Bobby Munoz denies shortness of breath at rest or orthopnea.  3. Mixed hyperlipidemia Chasin is on Lipitor, and he os working on weight loss. No chest pain was noted.  4. Primary hypertension Loki's blood pressure is controlled on his medications. He denies chest pain or headache. He is working on weight loss.  5. Hyperglycemia Bobby Munoz has a history of elevated glucose readings. He denies a history of diabetes mellitus.  6. At risk for heart disease Bobby Munoz is at a higher than average risk for cardiovascular disease due to obesity.   Assessment/Plan:   1. Other fatigue Bobby Munoz does feel that his weight is causing his energy to be lower than it should be. Fatigue may be related to obesity, depression or many other causes. Labs will be ordered, and in the meanwhile, Bobby Munoz will focus on self care including making healthy food choices, increasing physical activity and focusing on stress reduction.  - EKG 12-Lead - Vitamin B12 - CBC with Differential/Platelet - Folate - T3 - T4 - TSH - VITAMIN D 25 Hydroxy (Vit-D Deficiency, Fractures)  2. Shortness of breath on exertion Bobby Munoz does feel that he gets out of breath more easily that he used to when he exercises. Bobby Munoz's shortness of breath appears to be obesity related and exercise induced. He has agreed  to work on weight loss and gradually increase exercise to treat his exercise induced shortness of breath. Will continue to monitor closely.  3. Mixed hyperlipidemia Cardiovascular risk and specific lipid/LDL goals reviewed. We discussed several lifestyle modifications today. Bobby Munoz will start his Category  Plan, and will continue to work on exercise and weight loss efforts. We  will check labs today. Orders and follow up as documented in patient record.   - Lipid Panel With LDL/HDL Ratio  4. Primary hypertension Bobby Munoz will start his Category 3 plan, and will continue working on healthy weight loss and exercise to improve blood pressure control. We will watch for signs of hypotension as he continues his lifestyle modifications.  5. Hyperglycemia Fasting labs will be obtained today, and results with be discussed with Bobby Munoz in 2 weeks at his follow up visit. In the meanwhile Bobby Munoz will start his Category 3 plan and will work on weight loss efforts.  - Comprehensive metabolic panel - Hemoglobin A1c - Insulin, random  6. Screening for depression Bobby Munoz had a positive depression screening. Depression is commonly associated with obesity and often results in emotional eating behaviors. We will monitor this closely and work on CBT to help improve the non-hunger eating patterns. Referral to Psychology may be required if no improvement is seen as he continues in our clinic.  7. At risk for heart disease Bobby Munoz was given approximately 30 minutes of coronary artery disease prevention counseling today. He is 65 y.o. male and has risk factors for heart disease including obesity. We discussed intensive lifestyle modifications today with an emphasis on specific weight loss instructions and strategies.   Repetitive spaced learning was employed today to elicit superior memory formation and behavioral change.  8. Class 1 obesity with serious comorbidity and body mass index (BMI) of 33.0 to 33.9 in adult, unspecified obesity type Bobby Munoz is currently in the action stage of change and his goal is to continue with weight loss efforts. I recommend Bobby Munoz begin the structured treatment plan as follows:  He has agreed to the Category 3 Plan.  Exercise goals: No exercise has been prescribed for now, while we concentrate on nutritional changes.  Behavioral modification strategies: increasing  lean protein intake, decreasing simple carbohydrates, no skipping meals and meal planning and cooking strategies.  He was informed of the importance of frequent follow-up visits to maximize his success with intensive lifestyle modifications for his multiple health conditions. He was informed we would discuss his lab results at his next visit unless there is a critical issue that needs to be addressed sooner. Cephas agreed to keep his next visit at the agreed upon time to discuss these results.  Objective:   Blood pressure 125/79, pulse 66, temperature 98.3 F (36.8 C), height 5\' 6"  (1.676 m), weight 206 lb (93.4 kg), SpO2 98 %. Body mass index is 33.25 kg/m.  EKG: Normal sinus rhythm, rate 96 BPM.  Indirect Calorimeter completed today shows a VO2 of 340 and a REE of 2364.  His calculated basal metabolic rate is 2365 thus his basal metabolic rate is better than expected.  General: Cooperative, alert, well developed, in no acute distress. HEENT: Conjunctivae and lids unremarkable. Cardiovascular: Regular rhythm.  Lungs: Normal work of breathing. Neurologic: No focal deficits.   Lab Results  Component Value Date   CREATININE 0.86 11/22/2020   BUN 14 11/22/2020   NA 142 11/22/2020   K 4.5 11/22/2020   CL 101 11/22/2020   CO2 25 11/22/2020   Lab Results  Component Value Date   ALT 38 11/22/2020   AST 31 11/22/2020   ALKPHOS 65 11/22/2020   BILITOT 0.8 11/22/2020   Lab Results  Component Value Date   HGBA1C 6.1 (H) 11/22/2020   Lab Results  Component Value Date   INSULIN 16.6 11/22/2020   Lab Results  Component Value Date   TSH 2.370 11/22/2020   Lab Results  Component Value Date   CHOL 177 11/22/2020   HDL 57 11/22/2020   LDLCALC 93 11/22/2020   TRIG 155 (H) 11/22/2020   CHOLHDL 2.7 04/22/2018   Lab Results  Component Value Date   WBC 9.8 11/22/2020   HGB 15.3 11/22/2020   HCT 46.6 11/22/2020   MCV 97 11/22/2020   PLT 282 11/22/2020   No results found for:  IRON, TIBC, FERRITIN  Attestation Statements:   Reviewed by clinician on day of visit: allergies, medications, problem list, medical history, surgical history, family history, social history, and previous encounter notes.   I, Burt Knack, am acting as transcriptionist for Quillian Quince, MD.  I have reviewed the above documentation for accuracy and completeness, and I agree with the above. - Quillian Quince, MD

## 2020-12-06 ENCOUNTER — Ambulatory Visit (INDEPENDENT_AMBULATORY_CARE_PROVIDER_SITE_OTHER): Payer: Managed Care, Other (non HMO) | Admitting: Family Medicine

## 2020-12-06 ENCOUNTER — Other Ambulatory Visit: Payer: Self-pay

## 2020-12-06 ENCOUNTER — Encounter (INDEPENDENT_AMBULATORY_CARE_PROVIDER_SITE_OTHER): Payer: Self-pay | Admitting: Family Medicine

## 2020-12-06 VITALS — BP 147/70 | HR 64 | Temp 97.5°F | Ht 66.0 in | Wt 205.0 lb

## 2020-12-06 DIAGNOSIS — R7303 Prediabetes: Secondary | ICD-10-CM

## 2020-12-06 DIAGNOSIS — Z6833 Body mass index (BMI) 33.0-33.9, adult: Secondary | ICD-10-CM

## 2020-12-06 DIAGNOSIS — E782 Mixed hyperlipidemia: Secondary | ICD-10-CM

## 2020-12-06 DIAGNOSIS — E559 Vitamin D deficiency, unspecified: Secondary | ICD-10-CM

## 2020-12-06 DIAGNOSIS — Z9189 Other specified personal risk factors, not elsewhere classified: Secondary | ICD-10-CM | POA: Diagnosis not present

## 2020-12-06 DIAGNOSIS — E66811 Obesity, class 1: Secondary | ICD-10-CM

## 2020-12-06 DIAGNOSIS — E669 Obesity, unspecified: Secondary | ICD-10-CM

## 2020-12-06 MED ORDER — VITAMIN D (ERGOCALCIFEROL) 1.25 MG (50000 UNIT) PO CAPS: 50000.0000 [IU] | ORAL_CAPSULE | ORAL | 0 refills | Status: DC

## 2020-12-06 MED ORDER — METFORMIN HCL 500 MG PO TABS: 500.0000 mg | ORAL_TABLET | Freq: Every day | ORAL | 0 refills | Status: DC

## 2020-12-12 NOTE — Progress Notes (Signed)
Chief Complaint:   OBESITY Bobby Munoz is here to discuss his progress with his obesity treatment plan along with follow-up of his obesity related diagnoses. Bobby Munoz is on the Category 3 Plan and states he is following his eating plan approximately 80% of the time. Bobby Munoz states he is walking for 35-45 minutes 6 times per week.  Today's visit was #: 2 Starting weight: 206 lbs Starting date: 11/22/2020 Today's weight: 205 lbs Today's date: 12/06/2020 Total lbs lost to date: 1 Total lbs lost since last in-office visit: 1  Interim History: Bobby Munoz struggled to follow his plan closely especially with eating all of his protein. He would like to eat a more plant based diet and avoid processed foods which is a great option.  Subjective:   1. Mixed hyperlipidemia Bobby Munoz is on Lipitor, and his LDL and HDL are at goal, but triglycerides are >150. He denies chest pain. He is working on eating healthier and increasing activity. I discussed labs with the patient today.  2. Vitamin D deficiency Bobby Munoz has a new diagnosis of Vit D deficiency. His Vit D level is below goal. This puts him at high risk of osteoporosis. I discussed labs with the patient today.   3. Pre-diabetes Bobby Munoz's levels worsening, and his fasting glucose, A1c, and fasting insulin are elevated. This puts him at high risk of diabetes mellitus. He is working on diet and weight loss. I discussed labs with the patient today.  4. At risk for diabetes mellitus Bobby Munoz is at higher than average risk for developing diabetes due to obesity.   Assessment/Plan:   1. Mixed hyperlipidemia Cardiovascular risk and specific lipid/LDL goals reviewed. We discussed several lifestyle modifications today. Bobby Munoz will continue to work on diet, exercise and weight loss efforts. We will recheck labs in 3 months. Orders and follow up as documented in patient record.   2. Vitamin D deficiency Low Vitamin D level contributes to fatigue and are associated with  obesity, breast, and colon cancer. Bobby Munoz agreed to start prescription Vitamin D 50,000 IU every week with no refills. He will follow-up for routine testing of Vitamin D, at least 2-3 times per year to avoid over-replacement.  - Vitamin D, Ergocalciferol, (DRISDOL) 1.25 MG (50000 UNIT) CAPS capsule; Take 1 capsule (50,000 Units total) by mouth every 7 (seven) days.  Dispense: 4 capsule; Refill: 0  3. Pre-diabetes Bobby Munoz agreed to start metformin 500 mg q AM with food, with no refills. He will continue to work on weight loss, exercise, and decreasing simple carbohydrates to help decrease the risk of diabetes.   - metFORMIN (GLUCOPHAGE) 500 MG tablet; Take 1 tablet (500 mg total) by mouth daily with breakfast.  Dispense: 30 tablet; Refill: 0  4. At risk for diabetes mellitus Bobby Munoz was given approximately 30 minutes of diabetes education and counseling today. We discussed intensive lifestyle modifications today with an emphasis on weight loss as well as increasing exercise and decreasing simple carbohydrates in his diet. We also reviewed medication options with an emphasis on risk versus benefit of those discussed.   Repetitive spaced learning was employed today to elicit superior memory formation and behavioral change.  5. Obesity with current BMI 33.1 Bobby Munoz is currently in the action stage of change. As such, his goal is to continue with weight loss efforts. He has agreed to change to keeping a food journal and adhering to recommended goals of 1500-1800 calories and 100+ grams of protein daily.   Bobby Munoz was given the Pescatarian and Vegetarian  plans to help give him ideas of how to meet his goals without using protein supplements.  Exercise goals: As is.  Behavioral modification strategies: increasing lean protein intake.  Bobby Munoz has agreed to follow-up with our clinic in 2 weeks with William Hamburger, NP. He was informed of the importance of frequent follow-up visits to maximize his success with  intensive lifestyle modifications for his multiple health conditions.   Objective:   Blood pressure (!) 147/70, pulse 64, temperature (!) 97.5 F (36.4 C), height 5\' 6"  (1.676 m), weight 205 lb (93 kg), SpO2 99 %. Body mass index is 33.09 kg/m.  General: Cooperative, alert, well developed, in no acute distress. HEENT: Conjunctivae and lids unremarkable. Cardiovascular: Regular rhythm.  Lungs: Normal work of breathing. Neurologic: No focal deficits.   Lab Results  Component Value Date   CREATININE 0.86 11/22/2020   BUN 14 11/22/2020   NA 142 11/22/2020   K 4.5 11/22/2020   CL 101 11/22/2020   CO2 25 11/22/2020   Lab Results  Component Value Date   ALT 38 11/22/2020   AST 31 11/22/2020   ALKPHOS 65 11/22/2020   BILITOT 0.8 11/22/2020   Lab Results  Component Value Date   HGBA1C 6.1 (H) 11/22/2020   Lab Results  Component Value Date   INSULIN 16.6 11/22/2020   Lab Results  Component Value Date   TSH 2.370 11/22/2020   Lab Results  Component Value Date   CHOL 177 11/22/2020   HDL 57 11/22/2020   LDLCALC 93 11/22/2020   TRIG 155 (H) 11/22/2020   CHOLHDL 2.7 04/22/2018   Lab Results  Component Value Date   WBC 9.8 11/22/2020   HGB 15.3 11/22/2020   HCT 46.6 11/22/2020   MCV 97 11/22/2020   PLT 282 11/22/2020   No results found for: IRON, TIBC, FERRITIN  Attestation Statements:   Reviewed by clinician on day of visit: allergies, medications, problem list, medical history, surgical history, family history, social history, and previous encounter notes.   I, 01/22/2021, am acting as transcriptionist for Burt Knack, MD.  I have reviewed the above documentation for accuracy and completeness, and I agree with the above. -  Quillian Quince, MD

## 2020-12-20 ENCOUNTER — Ambulatory Visit (INDEPENDENT_AMBULATORY_CARE_PROVIDER_SITE_OTHER): Payer: Managed Care, Other (non HMO) | Admitting: Adult Health

## 2020-12-20 ENCOUNTER — Other Ambulatory Visit: Payer: Self-pay

## 2020-12-20 ENCOUNTER — Encounter (INDEPENDENT_AMBULATORY_CARE_PROVIDER_SITE_OTHER): Payer: Self-pay | Admitting: Adult Health

## 2020-12-20 VITALS — BP 146/88 | HR 94 | Temp 97.6°F | Ht 66.0 in | Wt 202.0 lb

## 2020-12-20 DIAGNOSIS — E669 Obesity, unspecified: Secondary | ICD-10-CM | POA: Diagnosis not present

## 2020-12-20 DIAGNOSIS — I10 Essential (primary) hypertension: Secondary | ICD-10-CM

## 2020-12-20 DIAGNOSIS — R7303 Prediabetes: Secondary | ICD-10-CM

## 2020-12-20 DIAGNOSIS — Z6832 Body mass index (BMI) 32.0-32.9, adult: Secondary | ICD-10-CM

## 2020-12-20 DIAGNOSIS — Z9189 Other specified personal risk factors, not elsewhere classified: Secondary | ICD-10-CM

## 2020-12-20 DIAGNOSIS — E559 Vitamin D deficiency, unspecified: Secondary | ICD-10-CM

## 2020-12-20 MED ORDER — METFORMIN HCL 500 MG PO TABS
500.0000 mg | ORAL_TABLET | Freq: Every day | ORAL | 0 refills | Status: DC
Start: 2020-12-20 — End: 2021-01-17

## 2020-12-20 MED ORDER — VITAMIN D (ERGOCALCIFEROL) 1.25 MG (50000 UNIT) PO CAPS: 50000.0000 [IU] | ORAL_CAPSULE | ORAL | 0 refills | Status: DC

## 2020-12-21 NOTE — Progress Notes (Signed)
Chief Complaint:   OBESITY Bobby Munoz is here to discuss his progress with his obesity treatment plan along with follow-up of his obesity related diagnoses. Bobby Munoz is on keeping a food journal and adhering to recommended goals of 1500-1800 calories and 100+ g protein and states he is following his eating plan approximately 75% of the time. Bobby Munoz states he is walking 30 minutes 6 times per week.  Today's visit was #: 3 Starting weight: 206 lbs Starting date: 11/22/2020 Today's weight: 202 lbs Today's date: 12/20/2020 Total lbs lost to date: 4 Total lbs lost since last in-office visit: 3  Interim History: Bobby Munoz estimates to track intake 50% of the time and.  He feels that he is hitting goals 75% of the time.  Since starting Metformin 500 mg QD, he reports a decline in appetite and it can be a challenge to consume as least 1500 cal/day.  Subjective:   1. Vitamin D deficiency Bobby Munoz's Vitamin D level was 14.3 on 11/22/2020, , which is well below goal of 50.Marland Kitchen He is currently taking prescription vitamin D 50,000 IU each week. He denies nausea, vomiting or muscle weakness.   Ref. Range 11/22/2020 08:24  Vitamin D, 25-Hydroxy Latest Ref Range: 30.0 - 100.0 ng/mL 14.3 (L)   2. Pre-diabetes [name] 11/22/2020 BG 131, A1c 6.1, and insulin level 16.6, which are all above goal. He was recently started on Metformin 500 mg QD. He reports a decline in appetite and denies GI upset.   Lab Results  Component Value Date   HGBA1C 6.1 (H) 11/22/2020   Lab Results  Component Value Date   INSULIN 16.6 11/22/2020    3. Hypertension, unspecified type Buford' BP is elevated at OV. He had not taken his daily anti-hypertensive medications. He denies chest pain. He denies tobacco/vape use.  BP Readings from Last 3 Encounters:  12/20/20 (!) 146/88  12/06/20 (!) 147/70  11/22/20 125/79    4. At risk for heart disease Bobby Munoz is at a higher than average risk for cardiovascular disease due to obesity, hypertension,  and pre-diabetes.  Assessment/Plan:   1. Vitamin D deficiency Low Vitamin D level contributes to fatigue and are associated with obesity, breast, and colon cancer. He agrees to continue to take prescription Vitamin D @50 ,000 IU every week and will follow-up for routine testing of Vitamin D, at least 2-3 times per year to avoid over-replacement.  - Vitamin D, Ergocalciferol, (DRISDOL) 1.25 MG (50000 UNIT) CAPS capsule; Take 1 capsule (50,000 Units total) by mouth every 7 (seven) days.  Dispense: 4 capsule; Refill: 0  2. Pre-diabetes Bobby Munoz will continue to work on weight loss, exercise, and decreasing simple carbohydrates to help decrease the risk of diabetes.   - metFORMIN (GLUCOPHAGE) 500 MG tablet; Take 1 tablet (500 mg total) by mouth daily with breakfast.  Dispense: 30 tablet; Refill: 0  3. Hypertension, unspecified type Bobby Munoz is working on healthy weight loss and exercise to improve blood pressure control. We will watch for signs of hypotension as he continues his lifestyle modifications. Take CCB and ACE daily.  4. At risk for heart disease Bobby Munoz was given approximately 15 minutes of coronary artery disease prevention counseling today. He is 65 y.o. male and has risk factors for heart disease including obesity. We discussed intensive lifestyle modifications today with an emphasis on specific weight loss instructions and strategies.   Repetitive spaced learning was employed today to elicit superior memory formation and behavioral change.  5. Class 1 obesity with serious comorbidity and  body mass index (BMI) of 32.0 to 32.9 in adult, unspecified obesity type Bobby Munoz is currently in the action stage of change. As such, his goal is to continue with weight loss efforts. He has agreed to keeping a food journal and adhering to recommended goals of 1500-1800 calories and 100 g protein.   Exercise goals: As is  Behavioral modification strategies: increasing lean protein intake, decreasing simple  carbohydrates, no skipping meals, meal planning and cooking strategies, planning for success and keeping a strict food journal.  Bobby Munoz has agreed to follow-up with our clinic in 3 weeks. He was informed of the importance of frequent follow-up visits to maximize his success with intensive lifestyle modifications for his multiple health conditions.   Objective:   Blood pressure (!) 146/88, pulse 94, temperature 97.6 F (36.4 C), height 5\' 6"  (1.676 m), weight 202 lb (91.6 kg), SpO2 97 %. Body mass index is 32.6 kg/m.  General: Cooperative, alert, well developed, in no acute distress. HEENT: Conjunctivae and lids unremarkable. Cardiovascular: Regular rhythm.  Lungs: Normal work of breathing. Neurologic: No focal deficits.   Lab Results  Component Value Date   CREATININE 0.86 11/22/2020   BUN 14 11/22/2020   NA 142 11/22/2020   K 4.5 11/22/2020   CL 101 11/22/2020   CO2 25 11/22/2020   Lab Results  Component Value Date   ALT 38 11/22/2020   AST 31 11/22/2020   ALKPHOS 65 11/22/2020   BILITOT 0.8 11/22/2020   Lab Results  Component Value Date   HGBA1C 6.1 (H) 11/22/2020   Lab Results  Component Value Date   INSULIN 16.6 11/22/2020   Lab Results  Component Value Date   TSH 2.370 11/22/2020   Lab Results  Component Value Date   CHOL 177 11/22/2020   HDL 57 11/22/2020   LDLCALC 93 11/22/2020   TRIG 155 (H) 11/22/2020   CHOLHDL 2.7 04/22/2018   Lab Results  Component Value Date   WBC 9.8 11/22/2020   HGB 15.3 11/22/2020   HCT 46.6 11/22/2020   MCV 97 11/22/2020   PLT 282 11/22/2020    Attestation Statements:   Reviewed by clinician on day of visit: allergies, medications, problem list, medical history, surgical history, family history, social history, and previous encounter notes.  01/22/2021, am acting as Edmund Hilda for Energy manager, NP.  I have reviewed the above documentation for accuracy and completeness, and I agree with the above. -  Daphane Odekirk  d. Aniqua Briere, NP-C

## 2021-01-17 ENCOUNTER — Ambulatory Visit (INDEPENDENT_AMBULATORY_CARE_PROVIDER_SITE_OTHER): Payer: Managed Care, Other (non HMO) | Admitting: Adult Health

## 2021-01-17 ENCOUNTER — Encounter (INDEPENDENT_AMBULATORY_CARE_PROVIDER_SITE_OTHER): Payer: Self-pay | Admitting: Adult Health

## 2021-01-17 ENCOUNTER — Other Ambulatory Visit: Payer: Self-pay

## 2021-01-17 VITALS — BP 127/79 | HR 64 | Temp 98.3°F | Ht 66.0 in | Wt 202.0 lb

## 2021-01-17 DIAGNOSIS — E66811 Obesity, class 1: Secondary | ICD-10-CM

## 2021-01-17 DIAGNOSIS — Z9189 Other specified personal risk factors, not elsewhere classified: Secondary | ICD-10-CM

## 2021-01-17 DIAGNOSIS — R7303 Prediabetes: Secondary | ICD-10-CM | POA: Diagnosis not present

## 2021-01-17 DIAGNOSIS — E559 Vitamin D deficiency, unspecified: Secondary | ICD-10-CM | POA: Diagnosis not present

## 2021-01-17 DIAGNOSIS — Z6832 Body mass index (BMI) 32.0-32.9, adult: Secondary | ICD-10-CM

## 2021-01-17 DIAGNOSIS — I10 Essential (primary) hypertension: Secondary | ICD-10-CM

## 2021-01-17 DIAGNOSIS — E669 Obesity, unspecified: Secondary | ICD-10-CM

## 2021-01-17 MED ORDER — VITAMIN D (ERGOCALCIFEROL) 1.25 MG (50000 UNIT) PO CAPS: 50000.0000 [IU] | ORAL_CAPSULE | ORAL | 0 refills | Status: DC

## 2021-01-17 MED ORDER — METFORMIN HCL 500 MG PO TABS: 500.0000 mg | ORAL_TABLET | Freq: Every day | ORAL | 0 refills | Status: DC

## 2021-01-18 DIAGNOSIS — R7303 Prediabetes: Secondary | ICD-10-CM | POA: Insufficient documentation

## 2021-01-18 DIAGNOSIS — E559 Vitamin D deficiency, unspecified: Secondary | ICD-10-CM | POA: Insufficient documentation

## 2021-01-18 NOTE — Progress Notes (Signed)
Chief Complaint:   OBESITY Bobby Munoz is here to discuss his progress with his obesity treatment plan along with follow-up of his obesity related diagnoses. Bobby Munoz is on keeping a food journal and adhering to recommended goals of 1500-1800 calories and 100 protein and states he is following his eating plan approximately 80% of the time. Bobby Munoz states he is walking 30 minutes 6 times per week.  Today's visit was #: 4 Starting weight: 206 lbs Starting date: 11/22/2020 Today's weight: 202 lbs Today's date: 01/17/2021 Total lbs lost to date: 4 Total lbs lost since last in-office visit: 0  Interim History: Mitigating circumstances: 1. Increased spinal stenosis pain- difficult to exercise. Pt is scheduled for injections next week. 2. Refrigerator was inoperable, so he ate out for 2 weeks. He and his wife will be traveling to [place] in 2 weeks. Discussed travel strategies.   Subjective:   1. Vitamin D deficiency Bobby Munoz's Vitamin D level was 14.3 on 11/22/2020, which is well below goal of 50.Bobby Munoz Kitchen He is currently taking prescription vitamin D 50,000 IU each week. He denies nausea, vomiting or muscle weakness.  2. Pre-diabetes 11/22/2020 A1c 6.1. CMP GFR >60. Pt is on Metformin 500 mg with breakfast.  Lab Results  Component Value Date   HGBA1C 6.1 (H) 11/22/2020   Lab Results  Component Value Date   INSULIN 16.6 11/22/2020    3. Hypertension, unspecified type BP much improved at OV today. Pt is on Norvasc 10 mg QD, and losartan 100 mg- 1/2 tab BID.  BP Readings from Last 3 Encounters:  01/17/21 127/79  12/20/20 (!) 146/88  12/06/20 (!) 147/70    4. At risk for osteoporosis Bobby Munoz is at higher risk of osteopenia and osteoporosis due to Vitamin D deficiency.   Assessment/Plan:   1. Vitamin D deficiency Low Vitamin D level contributes to fatigue and are associated with obesity, breast, and colon cancer. He agrees to continue to take prescription Vitamin D @50 ,000 IU every week and will  follow-up for routine testing of Vitamin D, at least 2-3 times per year to avoid over-replacement.  - Vitamin D, Ergocalciferol, (DRISDOL) 1.25 MG (50000 UNIT) CAPS capsule; Take 1 capsule (50,000 Units total) by mouth every 7 (seven) days.  Dispense: 4 capsule; Refill: 0  2. Pre-diabetes Bobby Munoz will continue to work on weight loss, exercise, and decreasing simple carbohydrates to help decrease the risk of diabetes.   - metFORMIN (GLUCOPHAGE) 500 MG tablet; Take 1 tablet (500 mg total) by mouth daily with breakfast.  Dispense: 30 tablet; Refill: 0  3. Hypertension, unspecified type Bobby Munoz is working on healthy weight loss and exercise to improve blood pressure control. We will watch for signs of hypotension as he continues his lifestyle modifications. Continue current anti-hypertensive regimen.  4. At risk for osteoporosis Bobby Munoz was given approximately 15 minutes of osteoporosis prevention counseling today. Bobby Munoz is at risk for osteopenia and osteoporosis due to his Vitamin D deficiency. He was encouraged to take his Vitamin D and follow his higher calcium diet and increase strengthening exercise to help strengthen his bones and decrease his risk of osteopenia and osteoporosis.  Repetitive spaced learning was employed today to elicit superior memory formation and behavioral change.  5. Class 1 obesity with serious comorbidity and body mass index (BMI) of 32.0 to 32.9 in adult, unspecified obesity type  Bobby Munoz is currently in the action stage of change. As such, his goal is to continue with weight loss efforts. He has agreed to the Category 3  Plan.   Exercise goals: No exercise has been prescribed at this time.  Behavioral modification strategies: increasing lean protein intake, meal planning and cooking strategies, travel eating strategies and planning for success.  Bobby Munoz has agreed to follow-up with our clinic in 4 weeks. He was informed of the importance of frequent follow-up visits to  maximize his success with intensive lifestyle modifications for his multiple health conditions.   Objective:   Blood pressure 127/79, pulse 64, temperature 98.3 F (36.8 C), height 5\' 6"  (1.676 m), weight 202 lb (91.6 kg), SpO2 98 %. Body mass index is 32.6 kg/m.  General: Cooperative, alert, well developed, in no acute distress. HEENT: Conjunctivae and lids unremarkable. Cardiovascular: Regular rhythm.  Lungs: Bobby Munoz work of breathing. Neurologic: No focal deficits.   Lab Results  Component Value Date   CREATININE 0.86 11/22/2020   BUN 14 11/22/2020   NA 142 11/22/2020   K 4.5 11/22/2020   CL 101 11/22/2020   CO2 25 11/22/2020   Lab Results  Component Value Date   ALT 38 11/22/2020   AST 31 11/22/2020   ALKPHOS 65 11/22/2020   BILITOT 0.8 11/22/2020   Lab Results  Component Value Date   HGBA1C 6.1 (H) 11/22/2020   Lab Results  Component Value Date   INSULIN 16.6 11/22/2020   Lab Results  Component Value Date   TSH 2.370 11/22/2020   Lab Results  Component Value Date   CHOL 177 11/22/2020   HDL 57 11/22/2020   LDLCALC 93 11/22/2020   TRIG 155 (H) 11/22/2020   CHOLHDL 2.7 04/22/2018   Lab Results  Component Value Date   WBC 9.8 11/22/2020   HGB 15.3 11/22/2020   HCT 46.6 11/22/2020   MCV 97 11/22/2020   PLT 282 11/22/2020   No results found for: IRON, TIBC, FERRITIN   Attestation Statements:   Reviewed by clinician on day of visit: allergies, medications, problem list, medical history, surgical history, family history, social history, and previous encounter notes.  01/22/2021, CMA, am acting as transcriptionist for Edmund Hilda, NP.  I have reviewed the above documentation for accuracy and completeness, and I agree with the above. -  Emoni Yang d. Caryl Fate, NP-C

## 2021-02-14 ENCOUNTER — Other Ambulatory Visit: Payer: Self-pay

## 2021-02-14 ENCOUNTER — Ambulatory Visit (INDEPENDENT_AMBULATORY_CARE_PROVIDER_SITE_OTHER): Payer: Managed Care, Other (non HMO) | Admitting: Adult Health

## 2021-02-14 ENCOUNTER — Encounter (INDEPENDENT_AMBULATORY_CARE_PROVIDER_SITE_OTHER): Payer: Self-pay | Admitting: Adult Health

## 2021-02-14 VITALS — BP 138/83 | Ht 66.0 in | Wt 196.0 lb

## 2021-02-14 DIAGNOSIS — E782 Mixed hyperlipidemia: Secondary | ICD-10-CM

## 2021-02-14 DIAGNOSIS — Z9189 Other specified personal risk factors, not elsewhere classified: Secondary | ICD-10-CM

## 2021-02-14 DIAGNOSIS — Z6832 Body mass index (BMI) 32.0-32.9, adult: Secondary | ICD-10-CM

## 2021-02-14 DIAGNOSIS — R7303 Prediabetes: Secondary | ICD-10-CM

## 2021-02-14 DIAGNOSIS — E669 Obesity, unspecified: Secondary | ICD-10-CM | POA: Diagnosis not present

## 2021-02-14 DIAGNOSIS — E559 Vitamin D deficiency, unspecified: Secondary | ICD-10-CM

## 2021-02-14 MED ORDER — VITAMIN D (ERGOCALCIFEROL) 1.25 MG (50000 UNIT) PO CAPS: 50000.0000 [IU] | ORAL_CAPSULE | ORAL | 0 refills | Status: DC

## 2021-02-14 MED ORDER — METFORMIN HCL 500 MG PO TABS: 500.0000 mg | ORAL_TABLET | Freq: Every day | ORAL | 0 refills | Status: DC

## 2021-02-19 NOTE — Progress Notes (Signed)
Chief Complaint:   OBESITY Bobby Munoz is here to discuss his progress with his obesity treatment plan along with follow-up of his obesity related diagnoses. Bobby Munoz is on the Category 3 Plan and states he is following his eating plan approximately 75% of the time. Bobby Munoz states he is walking 30 minutes 7 times per week.  Today's visit was #: 5 Starting weight: 206 lbs Starting date: 11/22/2020 Today's weight: 196 lbs Today's date: 02/14/2021 Total lbs lost to date: 10 Total lbs lost since last in-office visit: 6  Interim History: Emit traveled to [place] for 2 weeks. He walked daily and followed PC/[place] mindset- down 6 lbs! He consumed coffee with creamer this morning- will defer fasting labs until next OV.  Subjective:   1. Vitamin D deficiency Bobby Munoz's Vitamin D level was 14.3 on 11/22/2020-well  below goal of 50. He is currently taking prescription vitamin D 50,000 IU each week. He denies nausea, vomiting or muscle weakness.  2. Pre-diabetes On 11/22/2020, Bobby Munoz had an A1c of 6.1, BG 131, and insulin level 16.6.  3. Mixed hyperlipidemia Bobby Munoz is on the max dose of atorvastatin 80 mg. He denies myalgias.  4. At risk for heart disease Bobby Munoz is at a higher than average risk for cardiovascular disease due to obesity.   Assessment/Plan:   1. Vitamin D deficiency Low Vitamin D level contributes to fatigue and are associated with obesity, breast, and colon cancer. He agrees to continue to take prescription Vitamin D @50 ,000 IU every week and will follow-up for routine testing of Vitamin D, at least 2-3 times per year to avoid over-replacement. _Check labs at next OV. - Vitamin D, Ergocalciferol, (DRISDOL) 1.25 MG (50000 UNIT) CAPS capsule; Take 1 capsule (50,000 Units total) by mouth every 7 (seven) days.  Dispense: 4 capsule; Refill: 0  2. Pre-diabetes Bobby Munoz will continue to work on weight loss, exercise, and decreasing simple carbohydrates to help decrease the risk of diabetes.   -Check labs at next OV. - metFORMIN (GLUCOPHAGE) 500 MG tablet; Take 1 tablet (500 mg total) by mouth daily with breakfast.  Dispense: 30 tablet; Refill: 0  3. Mixed hyperlipidemia Cardiovascular risk and specific lipid/LDL goals reviewed.  We discussed several lifestyle modifications today and Bobby Munoz will continue to work on diet, exercise and weight loss efforts. Orders and follow up as documented in patient record.  -Check labs at next OV.  Counseling Intensive lifestyle modifications are the first line treatment for this issue. . Dietary changes: Increase soluble fiber. Decrease simple carbohydrates. . Exercise changes: Moderate to vigorous-intensity aerobic activity 150 minutes per week if tolerated. . Lipid-lowering medications: see documented in medical record.  4. At risk for heart disease Bobby Munoz was given approximately 15 minutes of coronary artery disease prevention counseling today. He is 65 y.o. male and has risk factors for heart disease including obesity. We discussed intensive lifestyle modifications today with an emphasis on specific weight loss instructions and strategies.   Repetitive spaced learning was employed today to elicit superior memory formation and behavioral change.  5. Obesity with current BMI 31.8 Bobby Munoz is currently in the action stage of change. As such, his goal is to continue with weight loss efforts. He has agreed to the Category 3 Plan.   Check fasting  Labs at next OV.  Exercise goals: As is  Behavioral modification strategies: increasing lean protein intake, decreasing simple carbohydrates, meal planning and cooking strategies, keeping healthy foods in the home and planning for success.  Bobby Munoz has agreed to  follow-up with our clinic in 3 weeks- fasting. He was informed of the importance of frequent follow-up visits to maximize his success with intensive lifestyle modifications for his multiple health conditions.   Objective:   Blood pressure  138/83, height 5\' 6"  (1.676 m), weight 196 lb (88.9 kg), SpO2 95 %. Body mass index is 31.64 kg/m.  General: Cooperative, alert, well developed, in no acute distress. HEENT: Conjunctivae and lids unremarkable. Cardiovascular: Regular rhythm.  Lungs: Normal work of breathing. Neurologic: No focal deficits.   Lab Results  Component Value Date   CREATININE 0.86 11/22/2020   BUN 14 11/22/2020   NA 142 11/22/2020   K 4.5 11/22/2020   CL 101 11/22/2020   CO2 25 11/22/2020   Lab Results  Component Value Date   ALT 38 11/22/2020   AST 31 11/22/2020   ALKPHOS 65 11/22/2020   BILITOT 0.8 11/22/2020   Lab Results  Component Value Date   HGBA1C 6.1 (H) 11/22/2020   Lab Results  Component Value Date   INSULIN 16.6 11/22/2020   Lab Results  Component Value Date   TSH 2.370 11/22/2020   Lab Results  Component Value Date   CHOL 177 11/22/2020   HDL 57 11/22/2020   LDLCALC 93 11/22/2020   TRIG 155 (H) 11/22/2020   CHOLHDL 2.7 04/22/2018   Lab Results  Component Value Date   WBC 9.8 11/22/2020   HGB 15.3 11/22/2020   HCT 46.6 11/22/2020   MCV 97 11/22/2020   PLT 282 11/22/2020   No results found for: IRON, TIBC, FERRITIN   Attestation Statements:   Reviewed by clinician on day of visit: allergies, medications, problem list, medical history, surgical history, family history, social history, and previous encounter notes.  01/22/2021, CMA, am acting as transcriptionist for Edmund Hilda, NP.  I have reviewed the above documentation for accuracy and completeness, and I agree with the above. -  Damyah Gugel d. Layken Doenges, NP-C

## 2021-03-07 ENCOUNTER — Encounter (INDEPENDENT_AMBULATORY_CARE_PROVIDER_SITE_OTHER): Payer: Self-pay | Admitting: Adult Health

## 2021-03-07 ENCOUNTER — Ambulatory Visit (INDEPENDENT_AMBULATORY_CARE_PROVIDER_SITE_OTHER): Payer: Managed Care, Other (non HMO) | Admitting: Adult Health

## 2021-03-07 ENCOUNTER — Other Ambulatory Visit: Payer: Self-pay

## 2021-03-07 VITALS — BP 140/82 | HR 77 | Temp 98.1°F | Ht 66.0 in | Wt 195.0 lb

## 2021-03-07 DIAGNOSIS — I10 Essential (primary) hypertension: Secondary | ICD-10-CM | POA: Diagnosis not present

## 2021-03-07 DIAGNOSIS — E782 Mixed hyperlipidemia: Secondary | ICD-10-CM

## 2021-03-07 DIAGNOSIS — E559 Vitamin D deficiency, unspecified: Secondary | ICD-10-CM | POA: Diagnosis not present

## 2021-03-07 DIAGNOSIS — Z6832 Body mass index (BMI) 32.0-32.9, adult: Secondary | ICD-10-CM

## 2021-03-07 DIAGNOSIS — R7303 Prediabetes: Secondary | ICD-10-CM

## 2021-03-07 DIAGNOSIS — Z9189 Other specified personal risk factors, not elsewhere classified: Secondary | ICD-10-CM

## 2021-03-07 DIAGNOSIS — E669 Obesity, unspecified: Secondary | ICD-10-CM

## 2021-03-07 MED ORDER — METFORMIN HCL 500 MG PO TABS: 500.0000 mg | ORAL_TABLET | Freq: Every day | ORAL | 0 refills | Status: DC

## 2021-03-07 MED ORDER — VITAMIN D (ERGOCALCIFEROL) 1.25 MG (50000 UNIT) PO CAPS: 50000.0000 [IU] | ORAL_CAPSULE | ORAL | 0 refills | Status: DC

## 2021-03-07 NOTE — Progress Notes (Signed)
Chief Complaint:   OBESITY Bobby Munoz is here to discuss his progress with his obesity treatment plan along with follow-up of his obesity related diagnoses. Bobby Munoz is on the Category 3 Plan and states he is following his eating plan approximately 80% of the time. Bobby Munoz states he is walking 30 minutes 4 times per week.  Today's visit was #: 6 Starting weight: 206 lbs Starting date: 11/22/2020 Today's weight: 195 lbs Today's date: 03/07/2021 Total lbs lost to date: 11 Total lbs lost since last in-office visit: 1  Interim History: Bobby Munoz received spinal injections several weeks ago, which limited mobility for a few 2 weeks after therapy. He has just recently been able to walk regularly.  Subjective:   1. Vitamin D deficiency Bobby Munoz's Vitamin D level was 14.3 on 11/22/2020- well below goal of 50. He is currently taking prescription vitamin D 50,000 IU each week. He denies nausea, vomiting or muscle weakness.  2. Pre-diabetes Bobby Munoz is on Metformin 500 mg QD tolerating it well.  Lab Results  Component Value Date   HGBA1C 6.1 (H) 11/22/2020   Lab Results  Component Value Date   INSULIN 16.6 11/22/2020   3. Mixed hyperlipidemia Bobby Munoz is on max dose Lipitor 80 mg.  He is also on Zetia 10 mg QD.  Lab Results  Component Value Date   ALT 38 11/22/2020   AST 31 11/22/2020   ALKPHOS 65 11/22/2020   BILITOT 0.8 11/22/2020   Lab Results  Component Value Date   CHOL 177 11/22/2020   HDL 57 11/22/2020   LDLCALC 93 11/22/2020   TRIG 155 (H) 11/22/2020   CHOLHDL 2.7 04/22/2018   4. Hypertension, unspecified type BP slightly elevate at OV. Bobby Munoz denies cardiac symptoms.  He is on amlodipine 10 mg and losartan 100 mg (100 BID, 50 mg BID).  BP Readings from Last 3 Encounters:  03/07/21 140/82  02/14/21 138/83  01/17/21 127/79   5. At risk for diabetes mellitus Bobby Munoz is at higher than average risk for developing diabetes due to obesity, pre-diabetes, hypertension, and  hyperlipidemia.   Assessment/Plan:   1. Vitamin D deficiency Low Vitamin D level contributes to fatigue and are associated with obesity, breast, and colon cancer. He agrees to continue to take prescription Vitamin D @50 ,000 IU every week and will follow-up for routine testing of Vitamin D, at least 2-3 times per year to avoid over-replacement. Check labs today.  - Vitamin D, Ergocalciferol, (DRISDOL) 1.25 MG (50000 UNIT) CAPS capsule; Take 1 capsule (50,000 Units total) by mouth every 7 (seven) days.  Dispense: 4 capsule; Refill: 0  - VITAMIN D 25 Hydroxy (Vit-D Deficiency, Fractures)  2. Pre-diabetes Bobby Munoz will continue to work on weight loss, exercise, and decreasing simple carbohydrates to help decrease the risk of diabetes.   - metFORMIN (GLUCOPHAGE) 500 MG tablet; Take 1 tablet (500 mg total) by mouth daily with breakfast.  Dispense: 30 tablet; Refill: 0  - Hemoglobin A1c - Insulin, random - Vitamin B12  3. Mixed hyperlipidemia Cardiovascular risk and specific lipid/LDL goals reviewed.  We discussed several lifestyle modifications today and Bobby Munoz will continue to work on diet, exercise and weight loss efforts. Orders and follow up as documented in patient record.  Check labs today.  Counseling Intensive lifestyle modifications are the first line treatment for this issue. Dietary changes: Increase soluble fiber. Decrease simple carbohydrates. Exercise changes: Moderate to vigorous-intensity aerobic activity 150 minutes per week if tolerated. Lipid-lowering medications: see documented in medical record.  - Lipid  panel  4. Hypertension, unspecified type Bobby Munoz is working on healthy weight loss and exercise to improve blood pressure control. We will watch for signs of hypotension as he continues his lifestyle modifications. Check labs today.  - Comprehensive metabolic panel  5. At risk for diabetes mellitus Bobby Munoz was given approximately 15 minutes of diabetes education and  counseling today. We discussed intensive lifestyle modifications today with an emphasis on weight loss as well as increasing exercise and decreasing simple carbohydrates in his diet. We also reviewed medication options with an emphasis on risk versus benefit of those discussed.   Repetitive spaced learning was employed today to elicit superior memory formation and behavioral change.  6. Obesity with current BMI 31.5  Bobby Munoz is currently in the action stage of change. As such, his goal is to continue with weight loss efforts. He has agreed to the Category 3 Plan.   Exercise goals:  As is  Behavioral modification strategies: increasing lean protein intake, decreasing simple carbohydrates, meal planning and cooking strategies, keeping healthy foods in the home, and planning for success.  Bobby Munoz has agreed to follow-up with our clinic in 4 weeks. He was informed of the importance of frequent follow-up visits to maximize his success with intensive lifestyle modifications for his multiple health conditions.   Bobby Munoz was informed we would discuss his lab results at his next visit unless there is a critical issue that needs to be addressed sooner. Bobby Munoz agreed to keep his next visit at the agreed upon time to discuss these results.  Objective:   Blood pressure 140/82, pulse 77, temperature 98.1 F (36.7 C), height 5\' 6"  (1.676 m), weight 195 lb (88.5 kg), SpO2 97 %. Body mass index is 31.47 kg/m.  General: Cooperative, alert, well developed, in no acute distress. HEENT: Conjunctivae and lids unremarkable. Cardiovascular: Regular rhythm.  Lungs: Normal work of breathing. Neurologic: No focal deficits.   Lab Results  Component Value Date   CREATININE 0.86 11/22/2020   BUN 14 11/22/2020   NA 142 11/22/2020   K 4.5 11/22/2020   CL 101 11/22/2020   CO2 25 11/22/2020   Lab Results  Component Value Date   ALT 38 11/22/2020   AST 31 11/22/2020   ALKPHOS 65 11/22/2020   BILITOT 0.8 11/22/2020    Lab Results  Component Value Date   HGBA1C 6.1 (H) 11/22/2020   Lab Results  Component Value Date   INSULIN 16.6 11/22/2020   Lab Results  Component Value Date   TSH 2.370 11/22/2020   Lab Results  Component Value Date   CHOL 177 11/22/2020   HDL 57 11/22/2020   LDLCALC 93 11/22/2020   TRIG 155 (H) 11/22/2020   CHOLHDL 2.7 04/22/2018   Lab Results  Component Value Date   WBC 9.8 11/22/2020   HGB 15.3 11/22/2020   HCT 46.6 11/22/2020   MCV 97 11/22/2020   PLT 282 11/22/2020   No results found for: IRON, TIBC, FERRITIN  Attestation Statements:   Reviewed by clinician on day of visit: allergies, medications, problem list, medical history, surgical history, family history, social history, and previous encounter notes.  01/22/2021, CMA, am acting as transcriptionist for Edmund Hilda, NP.  I have reviewed the above documentation for accuracy and completeness, and I agree with the above. -  Seth Higginbotham d. Caio Devera, NP-C

## 2021-03-08 LAB — COMPREHENSIVE METABOLIC PANEL
ALT: 37 IU/L (ref 0–44)
AST: 21 IU/L (ref 0–40)
Albumin/Globulin Ratio: 2 (ref 1.2–2.2)
Albumin: 4.9 g/dL — ABNORMAL HIGH (ref 3.8–4.8)
Alkaline Phosphatase: 46 IU/L (ref 44–121)
BUN/Creatinine Ratio: 27 — ABNORMAL HIGH (ref 10–24)
BUN: 21 mg/dL (ref 8–27)
Bilirubin Total: 0.7 mg/dL (ref 0.0–1.2)
CO2: 24 mmol/L (ref 20–29)
Calcium: 9.8 mg/dL (ref 8.6–10.2)
Chloride: 100 mmol/L (ref 96–106)
Creatinine, Ser: 0.79 mg/dL (ref 0.76–1.27)
Globulin, Total: 2.4 g/dL (ref 1.5–4.5)
Glucose: 102 mg/dL — ABNORMAL HIGH (ref 65–99)
Potassium: 4.7 mmol/L (ref 3.5–5.2)
Sodium: 140 mmol/L (ref 134–144)
Total Protein: 7.3 g/dL (ref 6.0–8.5)
eGFR: 99 mL/min/{1.73_m2} (ref >59)

## 2021-03-08 LAB — LIPID PANEL
Chol/HDL Ratio: 2.3 ratio (ref 0.0–5.0)
Cholesterol, Total: 161 mg/dL (ref 100–199)
HDL: 69 mg/dL (ref >39)
LDL Chol Calc (NIH): 75 mg/dL (ref 0–99)
Triglycerides: 90 mg/dL (ref 0–149)
VLDL Cholesterol Cal: 17 mg/dL (ref 5–40)

## 2021-03-08 LAB — INSULIN, RANDOM: INSULIN: 12.9 u[IU]/mL (ref 2.6–24.9)

## 2021-03-08 LAB — VITAMIN D 25 HYDROXY (VIT D DEFICIENCY, FRACTURES): Vit D, 25-Hydroxy: 41.4 ng/mL (ref 30.0–100.0)

## 2021-03-08 LAB — HEMOGLOBIN A1C
Est. average glucose Bld gHb Est-mCnc: 140 mg/dL
Hgb A1c MFr Bld: 6.5 % — ABNORMAL HIGH (ref 4.8–5.6)

## 2021-03-08 LAB — VITAMIN B12: Vitamin B-12: 263 pg/mL (ref 232–1245)

## 2021-03-30 ENCOUNTER — Other Ambulatory Visit: Payer: Self-pay | Admitting: Interventional Cardiology

## 2021-04-04 ENCOUNTER — Ambulatory Visit (INDEPENDENT_AMBULATORY_CARE_PROVIDER_SITE_OTHER): Payer: Managed Care, Other (non HMO) | Admitting: Family Medicine

## 2021-04-04 ENCOUNTER — Encounter (INDEPENDENT_AMBULATORY_CARE_PROVIDER_SITE_OTHER): Payer: Self-pay | Admitting: Family Medicine

## 2021-04-04 ENCOUNTER — Other Ambulatory Visit: Payer: Self-pay

## 2021-04-04 VITALS — BP 123/82 | HR 65 | Temp 97.7°F | Ht 66.0 in | Wt 192.0 lb

## 2021-04-04 DIAGNOSIS — E559 Vitamin D deficiency, unspecified: Secondary | ICD-10-CM

## 2021-04-04 DIAGNOSIS — Z6833 Body mass index (BMI) 33.0-33.9, adult: Secondary | ICD-10-CM | POA: Diagnosis not present

## 2021-04-04 DIAGNOSIS — Z9189 Other specified personal risk factors, not elsewhere classified: Secondary | ICD-10-CM

## 2021-04-04 DIAGNOSIS — E669 Obesity, unspecified: Secondary | ICD-10-CM | POA: Diagnosis not present

## 2021-04-04 DIAGNOSIS — E1169 Type 2 diabetes mellitus with other specified complication: Secondary | ICD-10-CM | POA: Diagnosis not present

## 2021-04-04 MED ORDER — METFORMIN HCL 500 MG PO TABS: 500.0000 mg | ORAL_TABLET | Freq: Every day | ORAL | 0 refills | Status: DC

## 2021-04-04 MED ORDER — VITAMIN D (ERGOCALCIFEROL) 1.25 MG (50000 UNIT) PO CAPS: 50000.0000 [IU] | ORAL_CAPSULE | ORAL | 0 refills | Status: DC

## 2021-04-05 ENCOUNTER — Other Ambulatory Visit (INDEPENDENT_AMBULATORY_CARE_PROVIDER_SITE_OTHER): Payer: Self-pay | Admitting: Adult Health

## 2021-04-05 DIAGNOSIS — E559 Vitamin D deficiency, unspecified: Secondary | ICD-10-CM

## 2021-04-11 NOTE — Progress Notes (Signed)
Chief Complaint:   OBESITY Bobby Munoz is here to discuss his progress with his obesity treatment plan along with follow-up of his obesity related diagnoses. Bobby Munoz is on the Category 3 Plan and states he is following his eating plan approximately 80% of the time. Bobby Munoz states he is walking for 60 minutes 5 times per week.  Today's visit was #: 7 Starting weight: 206 lbs Starting date: 11/22/2020 Today's weight: 192 lbs Today's date: 04/04/2021 Total lbs lost to date: 14 Total lbs lost since last in-office visit: 3  Interim History: Bobby Munoz continues to do well with weight loss. He is exercising regularly. He struggles to eat breakfast due to decreased hunger.  Subjective:   1. Type 2 diabetes mellitus with other specified complication, without long-term current use of insulin (HCC) Bobby Munoz has done well with diet, exercise, and metformin. His fasting glucose and insulin have improved, but his A1c is worsening and has increased to 6.5 which is just barely into diabetes mellitus. I discussed labs with the patient today.  2. Vitamin D deficiency Bobby Munoz's Vit D level is improving on Vit D prescription. I discussed labs with the patient today.  3. At risk for heart disease Bobby Munoz is at a higher than average risk for cardiovascular disease due to obesity.   Assessment/Plan:   1. Type 2 diabetes mellitus with other specified complication, without long-term current use of insulin (HCC) We will refill metformin for 1 month. Bobby Munoz will continue with diet and exercise, and we will recheck labs in 2-3 months. Good blood sugar control is important to decrease the likelihood of diabetic complications such as nephropathy, neuropathy, limb loss, blindness, coronary artery disease, and death. Intensive lifestyle modification including diet, exercise and weight loss are the first line of treatment for diabetes.   - metFORMIN (GLUCOPHAGE) 500 MG tablet; Take 1 tablet (500 mg total) by mouth daily with  breakfast.  Dispense: 30 tablet; Refill: 0  2. Vitamin D deficiency Low Vitamin D level contributes to fatigue and are associated with obesity, breast, and colon cancer. We will refill prescription Vitamin D for 1 month. Bobby Munoz will follow-up for routine testing of Vitamin D, at least 2-3 times per year to avoid over-replacement.  - Vitamin D, Ergocalciferol, (DRISDOL) 1.25 MG (50000 UNIT) CAPS capsule; Take 1 capsule (50,000 Units total) by mouth every 7 (seven) days.  Dispense: 4 capsule; Refill: 0  3. At risk for heart disease Bobby Munoz was given approximately 15 minutes of coronary artery disease prevention counseling today. He is 65 y.o. male and has risk factors for heart disease including obesity. We discussed intensive lifestyle modifications today with an emphasis on specific weight loss instructions and strategies.   Repetitive spaced learning was employed today to elicit superior memory formation and behavioral change.  4. Obesity with current BMI 31.1 Bobby Munoz is currently in the action stage of change. As such, his goal is to continue with weight loss efforts. He has agreed to the Category 3 Plan.   Bobby Munoz is ok to use a protein drink for breakfast versus skipping it altogether.   Exercise goals: As is.  Behavioral modification strategies: decreasing simple carbohydrates and no skipping meals.  Bobby Munoz has agreed to follow-up with our clinic in 2 to 3 weeks. He was informed of the importance of frequent follow-up visits to maximize his success with intensive lifestyle modifications for his multiple health conditions.   Objective:   Blood pressure 123/82, pulse 65, temperature 97.7 F (36.5 C), height 5\' 6"  (1.676  m), weight 192 lb (87.1 kg), SpO2 97 %. Body mass index is 30.99 kg/m.  General: Cooperative, alert, well developed, in no acute distress. HEENT: Conjunctivae and lids unremarkable. Cardiovascular: Regular rhythm.  Lungs: Normal work of breathing. Neurologic: No focal  deficits.   Lab Results  Component Value Date   CREATININE 0.79 03/07/2021   BUN 21 03/07/2021   NA 140 03/07/2021   K 4.7 03/07/2021   CL 100 03/07/2021   CO2 24 03/07/2021   Lab Results  Component Value Date   ALT 37 03/07/2021   AST 21 03/07/2021   ALKPHOS 46 03/07/2021   BILITOT 0.7 03/07/2021   Lab Results  Component Value Date   HGBA1C 6.5 (H) 03/07/2021   HGBA1C 6.1 (H) 11/22/2020   Lab Results  Component Value Date   INSULIN 12.9 03/07/2021   INSULIN 16.6 11/22/2020   Lab Results  Component Value Date   TSH 2.370 11/22/2020   Lab Results  Component Value Date   CHOL 161 03/07/2021   HDL 69 03/07/2021   LDLCALC 75 03/07/2021   TRIG 90 03/07/2021   CHOLHDL 2.3 03/07/2021   Lab Results  Component Value Date   VD25OH 41.4 03/07/2021   VD25OH 14.3 (L) 11/22/2020   Lab Results  Component Value Date   WBC 9.8 11/22/2020   HGB 15.3 11/22/2020   HCT 46.6 11/22/2020   MCV 97 11/22/2020   PLT 282 11/22/2020   No results found for: IRON, TIBC, FERRITIN  Attestation Statements:   Reviewed by clinician on day of visit: allergies, medications, problem list, medical history, surgical history, family history, social history, and previous encounter notes.   I, Burt Knack, am acting as transcriptionist for Quillian Quince, MD.  I have reviewed the above documentation for accuracy and completeness, and I agree with the above. -  Quillian Quince, MD

## 2021-04-25 ENCOUNTER — Ambulatory Visit (INDEPENDENT_AMBULATORY_CARE_PROVIDER_SITE_OTHER): Payer: Managed Care, Other (non HMO) | Admitting: Adult Health

## 2021-04-25 ENCOUNTER — Other Ambulatory Visit: Payer: Self-pay

## 2021-04-25 ENCOUNTER — Encounter (INDEPENDENT_AMBULATORY_CARE_PROVIDER_SITE_OTHER): Payer: Self-pay | Admitting: Adult Health

## 2021-04-25 VITALS — BP 126/74 | HR 68 | Temp 98.0°F | Ht 66.0 in | Wt 195.0 lb

## 2021-04-25 DIAGNOSIS — E559 Vitamin D deficiency, unspecified: Secondary | ICD-10-CM

## 2021-04-25 DIAGNOSIS — Z9189 Other specified personal risk factors, not elsewhere classified: Secondary | ICD-10-CM

## 2021-04-25 DIAGNOSIS — E669 Obesity, unspecified: Secondary | ICD-10-CM

## 2021-04-25 DIAGNOSIS — Z6833 Body mass index (BMI) 33.0-33.9, adult: Secondary | ICD-10-CM

## 2021-04-25 DIAGNOSIS — E119 Type 2 diabetes mellitus without complications: Secondary | ICD-10-CM | POA: Insufficient documentation

## 2021-04-25 DIAGNOSIS — E1169 Type 2 diabetes mellitus with other specified complication: Secondary | ICD-10-CM | POA: Diagnosis not present

## 2021-04-25 MED ORDER — RYBELSUS 3 MG PO TABS: 3.0000 mg | ORAL_TABLET | Freq: Every day | ORAL | 0 refills | Status: DC

## 2021-04-25 MED ORDER — VITAMIN D (ERGOCALCIFEROL) 1.25 MG (50000 UNIT) PO CAPS: 50000.0000 [IU] | ORAL_CAPSULE | ORAL | 0 refills | Status: DC

## 2021-04-25 NOTE — Progress Notes (Signed)
Chief Complaint:   OBESITY Bobby Munoz is here to discuss his progress with his obesity treatment plan along with follow-up of his obesity related diagnoses. Bobby Munoz is on the Category 3 Plan and states he is following his eating plan approximately 75% of the time. Bobby Munoz states he is doing cardio for 60 minutes 5 times per week.  Today's visit was #: 8 Starting weight: 206 lbs Starting date: 11/22/2020 Today's weight: 195 lbs Today's date: 04/25/2021 Total lbs lost to date: 11 lbs Total lbs lost since last in-office visit: 0  Interim History: Bobby Munoz is frustrated with the lack of results despite consistent adherence to meal plan and regular exercise.  He is on metformin 500 mg daily.  A1c is up to 6.5.  he denies family history of MTC or personal history of pancreatitis.  Subjective:   1. Type 2 diabetes mellitus with other specified complication, without long-term current use of insulin (HCC) He is on metformin 500 mg daily- A1c worsening. He denies family hx of MTC.  He denies personal pancreatitis. Discussed risks/benefits of GLP-1- agreeable to initiation of therapy.  Lab Results  Component Value Date   HGBA1C 6.5 (H) 03/07/2021   HGBA1C 6.1 (H) 11/22/2020   Lab Results  Component Value Date   LDLCALC 75 03/07/2021   CREATININE 0.79 03/07/2021   Lab Results  Component Value Date   INSULIN 12.9 03/07/2021   INSULIN 16.6 11/22/2020   2. Vitamin D deficiency Vitamin D level on 03/07/2021, 41.4, below goal of 50, however, improving.  He is currently taking prescription vitamin D 50,000 IU each week. He denies nausea, vomiting or muscle weakness.  Lab Results  Component Value Date   VD25OH 41.4 03/07/2021   VD25OH 14.3 (L) 11/22/2020   3. At risk for constipation Bobby Munoz is at increased risk for constipation due to inadequate water intake, changes in diet, and/or use of medications such as GLP1 agonists. Major denies hard, infrequent stools currently.    Assessment/Plan:    1. Type 2 diabetes mellitus with other specified complication, without long-term current use of insulin (HCC) Good blood sugar control is important to decrease the likelihood of diabetic complications such as nephropathy, neuropathy, limb loss, blindness, coronary artery disease, and death. Intensive lifestyle modification including diet, exercise and weight loss are the first line of treatment for diabetes.  Start Rybelsus 3 mg every morning.  Once Rybelsus has been started, stop metformin.  2. Vitamin D deficiency Low Vitamin D level contributes to fatigue and are associated with obesity, breast, and colon cancer. He agrees to continue to take prescription Vitamin D @50 ,000 IU every week and will follow-up for routine testing of Vitamin D, at least 2-3 times per year to avoid over-replacement.  - Refill Vitamin D, Ergocalciferol, (DRISDOL) 1.25 MG (50000 UNIT) CAPS capsule; Take 1 capsule (50,000 Units total) by mouth every 7 (seven) days.  Dispense: 4 capsule; Refill: 0  3. At risk for constipation Bobby Munoz was given approximately 15 minutes of counseling today regarding prevention of constipation. He was encouraged to increase water and fiber intake.   4. Obesity with current BMI 31.5  [name] is currently in the action stage of change. As such, his goal is to continue with weight loss efforts. He has agreed to the Category 3 Plan.   Exercise goals:  As is.  Behavioral modification strategies: increasing lean protein intake, decreasing simple carbohydrates, keeping healthy foods in the home, ways to avoid boredom eating, and planning for success.  Bobby Munoz  has agreed to follow-up with our clinic in 4 weeks. He was informed of the importance of frequent follow-up visits to maximize his success with intensive lifestyle modifications for his multiple health conditions.   Objective:   Blood pressure 126/74, pulse 68, temperature 98 F (36.7 C), height 5\' 6"  (1.676 m), weight 195 lb (88.5 kg),  SpO2 97 %. Body mass index is 31.47 kg/m.  General: Cooperative, alert, well developed, in no acute distress. HEENT: Conjunctivae and lids unremarkable. Cardiovascular: Regular rhythm.  Lungs: Normal work of breathing. Neurologic: No focal deficits.   Lab Results  Component Value Date   CREATININE 0.79 03/07/2021   BUN 21 03/07/2021   NA 140 03/07/2021   K 4.7 03/07/2021   CL 100 03/07/2021   CO2 24 03/07/2021   Lab Results  Component Value Date   ALT 37 03/07/2021   AST 21 03/07/2021   ALKPHOS 46 03/07/2021   BILITOT 0.7 03/07/2021   Lab Results  Component Value Date   HGBA1C 6.5 (H) 03/07/2021   HGBA1C 6.1 (H) 11/22/2020   Lab Results  Component Value Date   INSULIN 12.9 03/07/2021   INSULIN 16.6 11/22/2020   Lab Results  Component Value Date   TSH 2.370 11/22/2020   Lab Results  Component Value Date   CHOL 161 03/07/2021   HDL 69 03/07/2021   LDLCALC 75 03/07/2021   TRIG 90 03/07/2021   CHOLHDL 2.3 03/07/2021   Lab Results  Component Value Date   VD25OH 41.4 03/07/2021   VD25OH 14.3 (L) 11/22/2020   Lab Results  Component Value Date   WBC 9.8 11/22/2020   HGB 15.3 11/22/2020   HCT 46.6 11/22/2020   MCV 97 11/22/2020   PLT 282 11/22/2020   Attestation Statements:   Reviewed by clinician on day of visit: allergies, medications, problem list, medical history, surgical history, family history, social history, and previous encounter notes.  I, 01/22/2021, CMA, am acting as Insurance claims handler for Energy manager, NP.  I have reviewed the above documentation for accuracy and completeness, and I agree with the above. -  Verle Wheeling d. Menelik Mcfarren, NP-C

## 2021-05-01 ENCOUNTER — Other Ambulatory Visit (INDEPENDENT_AMBULATORY_CARE_PROVIDER_SITE_OTHER): Payer: Self-pay | Admitting: Family Medicine

## 2021-05-01 DIAGNOSIS — E1169 Type 2 diabetes mellitus with other specified complication: Secondary | ICD-10-CM

## 2021-05-01 NOTE — Telephone Encounter (Signed)
LAST APPOINTMENT DATE: 04/25/21 NEXT APPOINTMENT DATE: 05/23/21   [id number] [place] Otto, [place] - [address] AT [facility] [address] [place] [address] Phone: [phone] Fax: [phone]  Patient is requesting a refill of the following medications: Pending Prescriptions:                       Disp   Refills   metFORMIN (GLUCOPHAGE) 500 MG tablet [Phar*30 tab*0       Sig: TAKE 1 TABLET(500 MG) BY MOUTH DAILY WITH BREAKFAST   Date last filled: 04/04/21 Previously prescribed by [facility]  Lab Results      Component                Value               Date                      HGBA1C                   6.5 (H)             03/07/2021                HGBA1C                   6.1 (H)             11/22/2020           Lab Results      Component                Value               Date                      LDLCALC                  75                  03/07/2021                CREATININE               0.79                03/07/2021           Lab Results      Component                Value               Date                      VD25OH                   41.4                03/07/2021                VD25OH                   14.3 (L)            11/22/2020            BP Readings from Last 3 Encounters: 04/25/21 : 126/74 04/04/21 : 123/82 03/07/21 : 140/82

## 2021-05-06 ENCOUNTER — Other Ambulatory Visit (INDEPENDENT_AMBULATORY_CARE_PROVIDER_SITE_OTHER): Payer: Self-pay | Admitting: Family Medicine

## 2021-05-06 DIAGNOSIS — E559 Vitamin D deficiency, unspecified: Secondary | ICD-10-CM

## 2021-05-23 ENCOUNTER — Other Ambulatory Visit: Payer: Self-pay

## 2021-05-23 ENCOUNTER — Ambulatory Visit (INDEPENDENT_AMBULATORY_CARE_PROVIDER_SITE_OTHER): Payer: Managed Care, Other (non HMO) | Admitting: Adult Health

## 2021-05-23 ENCOUNTER — Other Ambulatory Visit (INDEPENDENT_AMBULATORY_CARE_PROVIDER_SITE_OTHER): Payer: Self-pay | Admitting: Adult Health

## 2021-05-23 ENCOUNTER — Encounter (INDEPENDENT_AMBULATORY_CARE_PROVIDER_SITE_OTHER): Payer: Self-pay | Admitting: Adult Health

## 2021-05-23 VITALS — BP 138/82 | HR 80 | Temp 98.0°F | Ht 66.0 in | Wt 195.0 lb

## 2021-05-23 DIAGNOSIS — E1169 Type 2 diabetes mellitus with other specified complication: Secondary | ICD-10-CM | POA: Diagnosis not present

## 2021-05-23 DIAGNOSIS — Z9189 Other specified personal risk factors, not elsewhere classified: Secondary | ICD-10-CM

## 2021-05-23 DIAGNOSIS — E559 Vitamin D deficiency, unspecified: Secondary | ICD-10-CM | POA: Diagnosis not present

## 2021-05-23 DIAGNOSIS — E669 Obesity, unspecified: Secondary | ICD-10-CM

## 2021-05-23 DIAGNOSIS — Z6833 Body mass index (BMI) 33.0-33.9, adult: Secondary | ICD-10-CM | POA: Diagnosis not present

## 2021-05-23 MED ORDER — RYBELSUS 7 MG PO TABS: 7.0000 mg | ORAL_TABLET | Freq: Every day | ORAL | 0 refills | Status: DC

## 2021-05-23 MED ORDER — VITAMIN D (ERGOCALCIFEROL) 1.25 MG (50000 UNIT) PO CAPS: 50000.0000 [IU] | ORAL_CAPSULE | ORAL | 0 refills | Status: DC

## 2021-05-23 NOTE — Progress Notes (Signed)
Chief Complaint:   OBESITY Bobby Munoz is here to discuss his progress with his obesity treatment plan along with follow-up of his obesity related diagnoses. Bobby Munoz is on the Category 3 Plan and states he is following his eating plan approximately 70% of the time. Bobby Munoz states he is walking for 60 minutes 3 times per week.  Today's visit was #: 9 Starting weight: 206 lbs Starting date: 11/22/2020 Today's weight: 195 lbs Today's date: 05/23/2021 Total lbs lost to date: 11 lbs Total lbs lost since last in-office visit: 0  Interim History: Bobby Munoz converted from metformin to Rybelsus at last office visit.  He is taking Rybelsus 3 mg daily.   He denies mass in neck, dysphagia, dyspepsia, or persistent hoarseness.  He does not check his blood sugar at home.  Subjective:   1. Vitamin D deficiency On 03/07/2021, vitamin D level - 21.4 - below goal of 50.  He is currently taking prescription ergocalciferol 50,000 IU each week. He denies nausea, vomiting or muscle weakness.  Lab Results  Component Value Date   VD25OH 41.4 03/07/2021   VD25OH 14.3 (L) 11/22/2020   2. Type 2 diabetes mellitus with other specified complication, without long-term current use of insulin (HCC) Bobby Munoz converted from metformin to Rybelsus at last office visit.  He is taking Rybelsus 3 mg daily.   He denies mass in neck, dysphagia, dyspepsia, or persistent hoarseness.  He does not check his blood sugar at home.  Lab Results  Component Value Date   HGBA1C 6.5 (H) 03/07/2021   HGBA1C 6.1 (H) 11/22/2020   Lab Results  Component Value Date   LDLCALC 75 03/07/2021   CREATININE 0.79 03/07/2021   Lab Results  Component Value Date   INSULIN 12.9 03/07/2021   INSULIN 16.6 11/22/2020   3. At risk for constipation Bobby Munoz is at increased risk for constipation due to taking a GLP-1 for T2D.   Assessment/Plan:   1. Vitamin D deficiency Refill ergocalciferol 50,000 IU weekly, as per below.  - Refill Vitamin D,  Ergocalciferol, (DRISDOL) 1.25 MG (50000 UNIT) CAPS capsule; Take 1 capsule (50,000 Units total) by mouth every 7 (seven) days.  Dispense: 4 capsule; Refill: 0  2. Type 2 diabetes mellitus with other specified complication, without long-term current use of insulin (HCC) Increase Rybelsus to 7 mg daily, as per below.  - Increase and Refill Semaglutide (RYBELSUS) 7 MG TABS; Take 7 mg by mouth daily.  Dispense: 30 tablet; Refill: 0  3. At risk for constipation Bobby Munoz was given approximately 15 minutes of counseling today regarding prevention of constipation. He was encouraged to increase water and fiber intake.    4. Obesity with current BMI 31.6  Bobby Munoz is currently in the action stage of change. As such, his goal is to continue with weight loss efforts. He has agreed to the Category 3 Plan.   Exercise goals:  As is.  Behavioral modification strategies: increasing lean protein intake, decreasing simple carbohydrates, meal planning and cooking strategies, keeping healthy foods in the home, and planning for success.  Check fasting labs at next office visit.  Change dinner and lunch timing, to improve compliance of eating all foods on plan each day.  Bobby Munoz has agreed to follow-up with our clinic in 4 weeks. He was informed of the importance of frequent follow-up visits to maximize his success with intensive lifestyle modifications for his multiple health conditions.   Objective:   Blood pressure 138/82, pulse 80, temperature 98 F (36.7 C), height  5\' 6"  (1.676 m), weight 195 lb (88.5 kg), SpO2 98 %. Body mass index is 31.47 kg/m.  General: Cooperative, alert, well developed, in no acute distress. HEENT: Conjunctivae and lids unremarkable. Cardiovascular: Regular rhythm.  Lungs: Normal work of breathing. Neurologic: No focal deficits.   Lab Results  Component Value Date   CREATININE 0.79 03/07/2021   BUN 21 03/07/2021   NA 140 03/07/2021   K 4.7 03/07/2021   CL 100 03/07/2021    CO2 24 03/07/2021   Lab Results  Component Value Date   ALT 37 03/07/2021   AST 21 03/07/2021   ALKPHOS 46 03/07/2021   BILITOT 0.7 03/07/2021   Lab Results  Component Value Date   HGBA1C 6.5 (H) 03/07/2021   HGBA1C 6.1 (H) 11/22/2020   Lab Results  Component Value Date   INSULIN 12.9 03/07/2021   INSULIN 16.6 11/22/2020   Lab Results  Component Value Date   TSH 2.370 11/22/2020   Lab Results  Component Value Date   CHOL 161 03/07/2021   HDL 69 03/07/2021   LDLCALC 75 03/07/2021   TRIG 90 03/07/2021   CHOLHDL 2.3 03/07/2021   Lab Results  Component Value Date   VD25OH 41.4 03/07/2021   VD25OH 14.3 (L) 11/22/2020   Lab Results  Component Value Date   WBC 9.8 11/22/2020   HGB 15.3 11/22/2020   HCT 46.6 11/22/2020   MCV 97 11/22/2020   PLT 282 11/22/2020   Attestation Statements:   Reviewed by clinician on day of visit: allergies, medications, problem list, medical history, surgical history, family history, social history, and previous encounter notes.  I, 01/22/2021, CMA, am acting as Insurance claims handler for Energy manager, NP.  I have reviewed the above documentation for accuracy and completeness, and I agree with the above. -  Shandiin Eisenbeis d. Sharde Gover, NP-C

## 2021-06-04 ENCOUNTER — Other Ambulatory Visit: Payer: Self-pay | Admitting: Interventional Cardiology

## 2021-06-08 ENCOUNTER — Telehealth: Payer: Self-pay

## 2021-06-08 NOTE — Telephone Encounter (Signed)
   [facility] Pre-operative Risk Assessment    Patient Name: Bobby Munoz  DOB: [birth year] MRN: [id number]  Request for surgical clearance:  What type of surgery is being performed? SCREENING COLONOSCOPY   When is this surgery scheduled? 09-28-2021  What type of clearance is required (medical clearance vs. Pharmacy clearance to hold med vs. Both)? PHARMACY  Are there any medications that need to be held prior to surgery and how long? PLAVIX  Practice name and name of physician performing surgery? [facility]  DR Michail Sermon    What is the office phone number? [phone]   7.   What is the office fax number? [phone]  8.   Anesthesia type (None, local, MAC, Munoz) ? PROPOFOL

## 2021-06-08 NOTE — Telephone Encounter (Signed)
   Name: GRIFFITH SANTILLI  DOB: 03/02/1956  MRN: [id number]  Primary Cardiologist: Lance Muss, MD  Chart reviewed as part of pre-operative protocol coverage. Because of Goku Harb Cudworth's past medical history and time since last visit, he will require a follow-up visit in order to better assess preoperative cardiovascular risk.  Pre-op covering staff: - Please schedule appointment and call patient to inform them. If patient already had an upcoming appointment within acceptable timeframe, please add "pre-op clearance" to the appointment notes so provider is aware. - Please contact requesting surgeon's office via preferred method (i.e, phone, fax) to inform them of need for appointment prior to surgery.  If applicable, this message will also be routed to pharmacy pool and/or primary cardiologist for input on holding anticoagulant/antiplatelet agent as requested below so that this information is available to the clearing provider at time of patient's appointment.   [place], [place]  06/08/2021, 10:52 PM

## 2021-06-11 NOTE — Telephone Encounter (Signed)
Left message for the pt to call back and schedule his f/u appt as per recall in epic, but also as his pre op appt as well.

## 2021-06-13 NOTE — Telephone Encounter (Signed)
Pt has appt 07/16/21 with Ronie Spies, PAC. I will forward notes to [facility] for upcoming appt. Will send FYI to requesting office pt has appt 07/16/21.

## 2021-06-20 ENCOUNTER — Ambulatory Visit (INDEPENDENT_AMBULATORY_CARE_PROVIDER_SITE_OTHER): Payer: Managed Care, Other (non HMO) | Admitting: Adult Health

## 2021-06-20 ENCOUNTER — Other Ambulatory Visit: Payer: Self-pay

## 2021-06-20 ENCOUNTER — Encounter (INDEPENDENT_AMBULATORY_CARE_PROVIDER_SITE_OTHER): Payer: Self-pay | Admitting: Adult Health

## 2021-06-20 VITALS — BP 121/77 | HR 77 | Temp 97.8°F | Ht 66.0 in | Wt 193.0 lb

## 2021-06-20 DIAGNOSIS — E1169 Type 2 diabetes mellitus with other specified complication: Secondary | ICD-10-CM

## 2021-06-20 DIAGNOSIS — E1159 Type 2 diabetes mellitus with other circulatory complications: Secondary | ICD-10-CM | POA: Diagnosis not present

## 2021-06-20 DIAGNOSIS — Z9189 Other specified personal risk factors, not elsewhere classified: Secondary | ICD-10-CM

## 2021-06-20 DIAGNOSIS — Z6832 Body mass index (BMI) 32.0-32.9, adult: Secondary | ICD-10-CM

## 2021-06-20 DIAGNOSIS — E559 Vitamin D deficiency, unspecified: Secondary | ICD-10-CM

## 2021-06-20 DIAGNOSIS — Z0000 Encounter for general adult medical examination without abnormal findings: Secondary | ICD-10-CM | POA: Insufficient documentation

## 2021-06-20 DIAGNOSIS — I152 Hypertension secondary to endocrine disorders: Secondary | ICD-10-CM

## 2021-06-20 DIAGNOSIS — E669 Obesity, unspecified: Secondary | ICD-10-CM

## 2021-06-20 MED ORDER — RYBELSUS 7 MG PO TABS: 7.0000 mg | ORAL_TABLET | Freq: Every day | ORAL | 0 refills | Status: DC

## 2021-06-20 NOTE — Progress Notes (Signed)
Chief Complaint:   OBESITY Bobby Munoz is here to discuss his progress with his obesity treatment plan along with follow-up of his obesity related diagnoses. Bobby Munoz is on the Category 3 Plan and states he is following his eating plan approximately 70% of the time. Bobby Munoz states he is walking for 60 minutes 5 times per week.  Today's visit was #: 10 Starting weight: 206 lbs Starting date: 11/22/2020 Today's weight: 193 lbs Today's date: 06/20/2021 Total lbs lost to date: 13 lbs Total lbs lost since last in-office visit: 2 lbs  Interim History: Bobby Munoz is slowly increasing his walking distance, striving to walk 4 miles (now walking 3-3.4 miles each walking session) - excellent! OD - cataract surgery - lens replacement- approx 2 weeks ago.  He denies pain and reports dramatic improvement of vision OD. He will have OS cataract surgery soon.  Subjective:   1. Vitamin D deficiency Vitamin D level on 03/07/2021 - 41.4. He is currently taking prescription ergocalciferol 50,000 IU each week. He denies nausea, vomiting or muscle weakness.  2. Type 2 diabetes mellitus with other specified complication, without long-term current use of insulin (HCC) He is on Rybelsus 7 mg - he denies mass in neck, dysphagia, dyspepsia, or persistent hoarseness.  3. Hypertension associated with diabetes [facility]) BP/HR excellent at office visit. He denies tobacco/vape use.  4. Healthcare maintenance B12 level - low normal at last check.  He reports fatigue. 03/07/21- B12 level 263  5. At risk for constipation Regnald is at increased risk for constipation due to GLP-1 for T2D.  Assessment/Plan:   1. Vitamin D deficiency Check labs, send MyChart.  - VITAMIN D 25 Hydroxy (Vit-D Deficiency, Fractures)  2. Type 2 diabetes mellitus with other specified complication, without long-term current use of insulin (HCC) Refill Rybelsus 7 mg daily, as per below. Check labs today.  - Hemoglobin A1c - Insulin, random - Refill  Semaglutide (RYBELSUS) 7 MG TABS; Take 7 mg by mouth daily.  Dispense: 30 tablet; Refill: 0  3. Hypertension associated with diabetes (HCC) Check labs today.  - Comprehensive metabolic panel  4. Healthcare maintenance Check B12 level today. Starting OTC Centrum Mens.  - Vitamin B12  5. At risk for constipation [place] was given approximately 15 minutes of counseling today regarding prevention of constipation. He was encouraged to increase water and fiber intake.    6. Obesity with current BMI 31.2  Bobby Munoz is currently in the action stage of change. As such, his goal is to continue with weight loss efforts. He has agreed to the Category 3 Plan.   Exercise goals:  As is.  Behavioral modification strategies: increasing lean protein intake, decreasing simple carbohydrates, meal planning and cooking strategies, keeping healthy foods in the home, and planning for success.  Majd has agreed to follow-up with our clinic in 4 weeks. He was informed of the importance of frequent follow-up visits to maximize his success with intensive lifestyle modifications for his multiple health conditions.   Nahun was informed we would discuss his lab results at his next visit unless there is a critical issue that needs to be addressed sooner. Jerri agreed to keep his next visit at the agreed upon time to discuss these results.  Objective:   Blood pressure 121/77, pulse 77, temperature 97.8 F (36.6 C), height 5\' 6"  (1.676 m), weight 193 lb (87.5 kg), SpO2 98 %. Body mass index is 31.15 kg/m.  General: Cooperative, alert, well developed, in no acute distress. HEENT: Conjunctivae and lids  unremarkable. Cardiovascular: Regular rhythm.  Lungs: Normal work of breathing. Neurologic: No focal deficits.   Lab Results  Component Value Date   CREATININE 0.79 03/07/2021   BUN 21 03/07/2021   NA 140 03/07/2021   K 4.7 03/07/2021   CL 100 03/07/2021   CO2 24 03/07/2021   Lab Results  Component Value Date    ALT 37 03/07/2021   AST 21 03/07/2021   ALKPHOS 46 03/07/2021   BILITOT 0.7 03/07/2021   Lab Results  Component Value Date   HGBA1C 6.5 (H) 03/07/2021   HGBA1C 6.1 (H) 11/22/2020   Lab Results  Component Value Date   INSULIN 12.9 03/07/2021   INSULIN 16.6 11/22/2020   Lab Results  Component Value Date   TSH 2.370 11/22/2020   Lab Results  Component Value Date   CHOL 161 03/07/2021   HDL 69 03/07/2021   LDLCALC 75 03/07/2021   TRIG 90 03/07/2021   CHOLHDL 2.3 03/07/2021   Lab Results  Component Value Date   VD25OH 41.4 03/07/2021   VD25OH 14.3 (L) 11/22/2020   Lab Results  Component Value Date   WBC 9.8 11/22/2020   HGB 15.3 11/22/2020   HCT 46.6 11/22/2020   MCV 97 11/22/2020   PLT 282 11/22/2020   Attestation Statements:   Reviewed by clinician on day of visit: allergies, medications, problem list, medical history, surgical history, family history, social history, and previous encounter notes.  I, Insurance claims handler, CMA, am acting as Energy manager for William Hamburger, NP.  I have reviewed the above documentation for accuracy and completeness, and I agree with the above. -  Arin Vanosdol d. Neiko Trivedi, NP-C

## 2021-06-21 ENCOUNTER — Encounter (INDEPENDENT_AMBULATORY_CARE_PROVIDER_SITE_OTHER): Payer: Self-pay | Admitting: Adult Health

## 2021-06-21 ENCOUNTER — Other Ambulatory Visit (INDEPENDENT_AMBULATORY_CARE_PROVIDER_SITE_OTHER): Payer: Self-pay | Admitting: Adult Health

## 2021-06-21 LAB — COMPREHENSIVE METABOLIC PANEL
ALT: 24 IU/L (ref 0–44)
AST: 23 IU/L (ref 0–40)
Albumin/Globulin Ratio: 2 (ref 1.2–2.2)
Albumin: 4.8 g/dL (ref 3.8–4.8)
Alkaline Phosphatase: 58 IU/L (ref 44–121)
BUN/Creatinine Ratio: 15 (ref 10–24)
BUN: 12 mg/dL (ref 8–27)
Bilirubin Total: 0.6 mg/dL (ref 0.0–1.2)
CO2: 24 mmol/L (ref 20–29)
Calcium: 9.3 mg/dL (ref 8.6–10.2)
Chloride: 103 mmol/L (ref 96–106)
Creatinine, Ser: 0.82 mg/dL (ref 0.76–1.27)
Globulin, Total: 2.4 g/dL (ref 1.5–4.5)
Glucose: 100 mg/dL — ABNORMAL HIGH (ref 70–99)
Potassium: 4.7 mmol/L (ref 3.5–5.2)
Sodium: 142 mmol/L (ref 134–144)
Total Protein: 7.2 g/dL (ref 6.0–8.5)
eGFR: 97 mL/min/{1.73_m2} (ref >59)

## 2021-06-21 LAB — VITAMIN D 25 HYDROXY (VIT D DEFICIENCY, FRACTURES): Vit D, 25-Hydroxy: 71.3 ng/mL (ref 30.0–100.0)

## 2021-06-21 LAB — HEMOGLOBIN A1C
Est. average glucose Bld gHb Est-mCnc: 117 mg/dL
Hgb A1c MFr Bld: 5.7 % — ABNORMAL HIGH (ref 4.8–5.6)

## 2021-06-21 LAB — INSULIN, RANDOM: INSULIN: 10.1 u[IU]/mL (ref 2.6–24.9)

## 2021-06-21 LAB — VITAMIN B12: Vitamin B-12: 476 pg/mL (ref 232–1245)

## 2021-07-04 ENCOUNTER — Other Ambulatory Visit: Payer: Self-pay | Admitting: Interventional Cardiology

## 2021-07-11 ENCOUNTER — Encounter: Payer: Self-pay | Admitting: Physician Assistant

## 2021-07-11 NOTE — Progress Notes (Signed)
Cardiology Office Note    Date:  07/16/2021   ID:  Kerrie, Latour 09-Oct-1955, MRN [id number]  PCP:  Sigmund Hazel, MD  Cardiologist:  Lance Muss, MD  Electrophysiologist:  None   Chief Complaint: f/u CAD  History of Present Illness:   Bobby Munoz is a 65 y.o. male with history of CAD, HTN, HLD, LBBB, syncope 2019, bradycardia, mild MR by echo 2019 who presents for pre-procedure evaluation and overdue follow-up. He had a prior stress test in 2015 that was abnormal prompting cardiac cath. He underwent DESx2 to LAD in 02/2014 then CTO PCI to RCA with 4 overlapping stents in 04/2014. He had an episode of syncope in 2019 in setting of dehydration, hypotension and AKI, treated with IV fluids. Metoprolol was stopped due to bradycardia; mention of 3.1 sec pause on telemetry. 2D echo 12/2017 EF 55-60%, mild MR. 48hr monitor showed NSR average HR 61, lowest HR 44bpm, occasional PACs/PVCs. He called in in 11/2018 with fatigue and slow HR by home checks. Event monitor 01/2019 showed NSR with frequent PVCs (9%), occasional PACs, lowest HR 47bpm, average 68bpm. Dr. Eldridge Dace felt monitor was benign. He has remained on Plavix monotherapy.   He is seen back for follow-up overall feeling well without any new cardiac complaints. He walks 5 miles a day on a treadmill without any complaints. No CP, dyspnea, palpitations, or dizziness reported. We discussed potential drug interaction with Plavix/Celexa. Has colonoscopy planned in January.  Labwork independently reviewed: 06/2021 K 4.7, CR 0.82, LFTs wnl, A1c 5.7 02/2021 LDL 75 (PCP) 11/2020 TSH wnl, CBC wnl 2015 Mg 2.2   Past Medical History:  Diagnosis Date   Allergic to cats    Anxiety    Arthritis    "left shoulder" (04/20/2014)   Coronary artery disease    Food allergy    GERD (gastroesophageal reflux disease)    Hearing loss    High cholesterol    Hypertension    Joint pain    LBBB (left bundle branch block) 2010   Mild mitral  regurgitation    Other fatigue    Pneumonia X 1   Prediabetes    Premature atrial contractions    PVC's (premature ventricular contractions)    Seasonal allergies    "fall and spring"   Spinal stenosis     Past Surgical History:  Procedure Laterality Date   CARDIAC CATHETERIZATION  ~ 2000; 03/08/2014   CARDIAC CATHETERIZATION  04/20/2014   PCI of a chronic total occlusion in the mid right coronary artery.  There were 4 overlapping stents placed    CARDIAC CATHETERIZATION  04/20/2014   Procedure: LEFT HEART CATH;  Surgeon: Corky Crafts, MD;  Location: [facility] CATH LAB;  Service: Cardiovascular;;   CARDIOVASCULAR STRESS TEST  2011   CORONARY ANGIOPLASTY WITH STENT PLACEMENT  03/09/2014; 04/20/2014   "2 + 4"   LEFT HEART CATHETERIZATION WITH CORONARY ANGIOGRAM N/A 03/08/2014   Procedure: LEFT HEART CATHETERIZATION WITH CORONARY ANGIOGRAM;  Surgeon: Corky Crafts, MD;  Location: [facility] CATH LAB;  Service: Cardiovascular;  Laterality: N/A;   PERCUTANEOUS CORONARY STENT INTERVENTION (PCI-S) N/A 03/09/2014   Procedure: PERCUTANEOUS CORONARY STENT INTERVENTION (PCI-S);  Surgeon: Corky Crafts, MD;  Location[facility] CATH LAB;  Service: Cardiovascular;  Laterality: N/A;   PERCUTANEOUS CORONARY STENT INTERVENTION (PCI-S) N/A 04/20/2014   Procedure: PERCUTANEOUS CORONARY STENT INTERVENTION (PCI-S);  Surgeon: Corky Crafts, MD;  Location: [facility] CATH LAB;  Service: Cardiovascular;  Laterality: N/A;   TONSILLECTOMY  Current Medications: Current Meds  Medication Sig   acetaminophen (TYLENOL) 500 MG tablet Take 500 mg by mouth every 8 (eight) hours as needed. Take two tablets by mouth up to 3 times daily as needed.   amLODipine (NORVASC) 10 MG tablet TAKE 1 TABLET BY MOUTH DAILY   atorvastatin (LIPITOR) 80 MG tablet TAKE 1 TABLET(80 MG) BY MOUTH DAILY. PLEASE MAKE OVERDUE APPOINTMENT FOR FUTURE REFILLS THANK YOU FIRST ATTEMPT   citalopram (CELEXA) 40 MG tablet Take 40 mg by mouth daily.    clopidogrel (PLAVIX) 75 MG tablet TAKE 1 TABLET(75 MG) BY MOUTH DAILY   ezetimibe (ZETIA) 10 MG tablet TAKE 1 TABLET(10 MG) BY MOUTH DAILY   gabapentin (NEURONTIN) 300 MG capsule Take 300 mg by mouth. Take 3 tablets (900mg  total) by mouth as needed.   losartan (COZAAR) 100 MG tablet Take 0.5 tablets (50 mg total) by mouth 2 (two) times daily. Please make yearly appt with Dr. for August 2022 for future refills. Thank you 1st attempt   nitroGLYCERIN (NITROSTAT) 0.4 MG SL tablet Place 1 tablet (0.4 mg total) under the tongue every 5 (five) minutes as needed for chest pain (X3 DOSES BEFORE CALLING 911).   pantoprazole (PROTONIX) 40 MG tablet TAKE 1 TABLET(40 MG) BY MOUTH DAILY   Semaglutide (RYBELSUS) 7 MG TABS Take 7 mg by mouth daily.   traZODone (DESYREL) 100 MG tablet Take 100 mg by mouth at bedtime.      Allergies:   Shellfish allergy, Shellfish-derived products, and Latex   Social History   Socioeconomic History   Marital status: Married    Spouse name: September 2022   Number of children: Not on file   Years of education: Not on file   Highest education level: Not on file  Occupational History   Occupation: [name]  Tobacco Use   Smoking status: Never   Smokeless tobacco: Never  Vaping Use   Vaping Use: Never used  Substance and Sexual Activity   Alcohol use: Yes    Comment: 04/20/2014 "used to have couple glasses of wine/wk; none since 02/2014"   Drug use: No   Sexual activity: Yes    Partners: Female  Other Topics Concern   Not on file  Social History Narrative   Not on file   Social Determinants of Health   Financial Resource Strain: Not on file  Food Insecurity: Not on file  Transportation Needs: Not on file  Physical Activity: Not on file  Stress: Not on file  Social Connections: Not on file     Family History:  The patient's family history includes Cancer in his mother; Healthy in his sister and sister; Heart attack in his father; Heart disease  in his father; Heart failure in his father; Hyperlipidemia in his father; Hypertension in his father and mother; Stroke in his father; Sudden death in his father.  ROS:   Please see the history of present illness.  All other systems are reviewed and otherwise negative.    EKGs/Labs/Other Studies Reviewed:    Studies reviewed are outlined and summarized above. Reports included below if pertinent.   2D Echo 12/2017 - Left ventricle: The cavity size was normal. Systolic function was    normal. The estimated ejection fraction was in the range of 55%    to 60%. Wall motion was normal; there were no regional wall    motion abnormalities. Left ventricular diastolic function    parameters were normal.  - Aortic valve: Transvalvular velocity was within the  normal range.    There was no stenosis. There was no regurgitation.  - Mitral valve: Transvalvular velocity was within the normal range.    There was no evidence for stenosis. There was mild regurgitation.    Valve area by pressure half-time: 1.68 cm^2.  - Right ventricle: The cavity size was normal. Wall thickness was    normal. Systolic function was normal.  - Atrial septum: No defect or patent foramen ovale was identified    by color flow Doppler.  - Tricuspid valve: There was trivial regurgitation.  - Pulmonary arteries: Systolic pressure was within the normal    range. PA peak pressure: 28 mm Hg (S).    Monitor 01/2019 ? NSR, with occasional PACs and PVCs. ? PVCs comprised 9% of total heart beats. ? No sustained pathologic arrhtyhmias.   Continue medical therapy  48HR Monitor 2019 ? Normal sinus rhythm ? Occasional PVCs and PACs. ? Slowest HR 44 bpm; Average HR 61. ? No clear etiology for syncope identified.   Continue to use nonrate slowing drugs for BP control.         EKG:  EKG is ordered today, personally reviewed, demonstrating NSR 65bpm, LBBB, QTC  Recent Labs: 11/22/2020: Hemoglobin 15.3; Platelets 282;  TSH 2.370 06/20/2021: ALT 24; BUN 12; Creatinine, Ser 0.82; Potassium 4.7; Sodium 142  Recent Lipid Panel    Component Value Date/Time   CHOL 161 03/07/2021 1030   TRIG 90 03/07/2021 1030   HDL 69 03/07/2021 1030   CHOLHDL 2.3 03/07/2021 1030   CHOLHDL 3.4 02/15/2016 1119   VLDL 31 (H) 02/15/2016 1119   LDLCALC 75 03/07/2021 1030    PHYSICAL EXAM:    VS:  BP 110/82   Pulse 68   Ht 5\' 7"  (1.702 m)   Wt 200 lb (90.7 kg)   SpO2 99%   BMI 31.32 kg/m   BMI: Body mass index is 31.32 kg/m.  GEN: Well nourished, well developed male in no acute distress HEENT: normocephalic, atraumatic Neck: no JVD, carotid bruits, or masses Cardiac: RRR; no murmurs, rubs, or gallops, no edema  Respiratory:  clear to auscultation bilaterally, normal work of breathing GI: soft, nontender, nondistended, + BS MS: no deformity or atrophy Skin: warm and dry, no rash Neuro:  Alert and Oriented x 3, Strength and sensation are intact, follows commands Psych: euthymic mood, full affect  Wt Readings from Last 3 Encounters:  07/16/21 200 lb (90.7 kg)  06/20/21 193 lb (87.5 kg)  05/23/21 195 lb (88.5 kg)     ASSESSMENT & PLAN:   1. Pre-op cardiovascular exam for colonoscopy - The patient affirms he has been doing well without any new cardiac symptoms. Able to exert over 4 METS regularly walking on treadmill without any complaints. Therefore, based on ACC/AHA guidelines, the patient would be at acceptable risk for the planned procedure without further cardiovascular testing. Dr. 07/23/21 has previously cleared patient to hold Plavix for 5 days prior to unrelated procedure - given no clinical changes recently, this clearance still applies as long as he remains stable between now and procedure. The patient was advised that if he develops new symptoms prior to surgery to contact our office to arrange for a follow-up visit, and he verbalized understanding. Will route copy of note to requesting party upon  signing.  2. CAD with HLD goal LDL <70, known LBBB - no recent anginal symptoms. Continue Plavix monotherapy. Advised he discuss Celexa dosing with PCP so they are aware the max recommended  dose of Celexa is 20mg  daily while on Plavix (he has been on 40mg  for a long time per his report and doing well). QTc is normal and no hx abnormal bleeding. Lipids from 02/2021 reviewed - LDL slightly above goal of <70 but discussed lifestyle modifications to reach goal. He is working on his weight which will help improve this number. Further monitoring of lipids by primary care. Otherwise continue atorvastatin and ezetimibe.  3. Essential HTN - controlled on present regimen of amlodipine, losartan. No changes made today.  4. PVCs/PACs - quiescent on EKG, exam, and by symptoms. Not on BB presently due to hx bradycardia which is also quiescent. Follow for any recurrent symptoms.  5. Mild mitral regurgitation - guidelines would suggest repeating echocardiogram every 3-5 years. As he is asymptomatic, would consider repeating study in 2024 after next follow-up visit.   Disposition: F/u with Dr. 03/2021 in 1 year.   Medication Adjustments/Labs and Tests Ordered: Current medicines are reviewed at length with the patient today.  Concerns regarding medicines are outlined above. Medication changes, Labs and Tests ordered today are summarized above and listed in the Patient Instructions accessible in Encounters.   Signed, 2025, PA-C  07/16/2021 9:50 AM    [facility] [address] [place], [place], [place]  [place] Phone: [phone]; Fax: [phone]

## 2021-07-16 ENCOUNTER — Encounter: Payer: Self-pay | Admitting: Physician Assistant

## 2021-07-16 ENCOUNTER — Other Ambulatory Visit: Payer: Self-pay

## 2021-07-16 ENCOUNTER — Ambulatory Visit (INDEPENDENT_AMBULATORY_CARE_PROVIDER_SITE_OTHER): Payer: Managed Care, Other (non HMO) | Admitting: Physician Assistant

## 2021-07-16 VITALS — BP 110/82 | HR 68 | Ht 67.0 in | Wt 200.0 lb

## 2021-07-16 DIAGNOSIS — I10 Essential (primary) hypertension: Secondary | ICD-10-CM

## 2021-07-16 DIAGNOSIS — Z01810 Encounter for preprocedural cardiovascular examination: Secondary | ICD-10-CM

## 2021-07-16 DIAGNOSIS — I491 Atrial premature depolarization: Secondary | ICD-10-CM

## 2021-07-16 DIAGNOSIS — E785 Hyperlipidemia, unspecified: Secondary | ICD-10-CM

## 2021-07-16 DIAGNOSIS — I493 Ventricular premature depolarization: Secondary | ICD-10-CM

## 2021-07-16 DIAGNOSIS — I2510 Atherosclerotic heart disease of native coronary artery without angina pectoris: Secondary | ICD-10-CM

## 2021-07-16 DIAGNOSIS — I340 Nonrheumatic mitral (valve) insufficiency: Secondary | ICD-10-CM

## 2021-07-16 NOTE — Patient Instructions (Addendum)
Medication Instructions:  Your physician recommends that you continue on your current medications as directed. Please refer to the Current Medication list given to you today.  *If you need a refill on your cardiac medications before your next appointment, please call your pharmacy*   Lab Work: None ordered  If you have labs (blood work) drawn today and your tests are completely normal, you will receive your results only by: MyChart Message (if you have MyChart) OR A paper copy in the mail If you have any lab test that is abnormal or we need to change your treatment, we will call you to review the results.   Testing/Procedures: None ordered   Follow-Up: At [facility], you and your health needs are our priority.  As part of our continuing mission to provide you with exceptional heart care, we have created designated Provider Care Teams.  These Care Teams include your primary Cardiologist (physician) and Advanced Practice Providers (APPs -  Physician Assistants and Nurse Practitioners) who all work together to provide you with the care you need, when you need it.  We recommend signing up for the patient portal called "MyChart".  Sign up information is provided on this After Visit Summary.  MyChart is used to connect with patients for Virtual Visits (Telemedicine).  Patients are able to view lab/test results, encounter notes, upcoming appointments, etc.  Non-urgent messages can be sent to your provider as well.   To learn more about what you can do with MyChart, go to [email or web address].    Your next appointment:   1 year(s)  The format for your next appointment:   In Person  Provider:   You may see Lance Muss, MD or one of the following Advanced Practice Providers on your designated Care Team:   Ronie Spies, PA-C Jacolyn Reedy, PA-C   Other Instructions Please follow-up with your Primary care regarding the max dose of Celexa while on Plavix 20 mg

## 2021-07-18 ENCOUNTER — Other Ambulatory Visit: Payer: Self-pay

## 2021-07-18 ENCOUNTER — Encounter (INDEPENDENT_AMBULATORY_CARE_PROVIDER_SITE_OTHER): Payer: Self-pay | Admitting: Adult Health

## 2021-07-18 ENCOUNTER — Ambulatory Visit (INDEPENDENT_AMBULATORY_CARE_PROVIDER_SITE_OTHER): Payer: Managed Care, Other (non HMO) | Admitting: Adult Health

## 2021-07-18 VITALS — BP 118/73 | HR 63 | Temp 97.4°F | Ht 67.0 in | Wt 192.0 lb

## 2021-07-18 DIAGNOSIS — E1169 Type 2 diabetes mellitus with other specified complication: Secondary | ICD-10-CM

## 2021-07-18 DIAGNOSIS — I152 Hypertension secondary to endocrine disorders: Secondary | ICD-10-CM

## 2021-07-18 DIAGNOSIS — E669 Obesity, unspecified: Secondary | ICD-10-CM

## 2021-07-18 DIAGNOSIS — Z9189 Other specified personal risk factors, not elsewhere classified: Secondary | ICD-10-CM

## 2021-07-18 DIAGNOSIS — E1159 Type 2 diabetes mellitus with other circulatory complications: Secondary | ICD-10-CM

## 2021-07-18 DIAGNOSIS — E559 Vitamin D deficiency, unspecified: Secondary | ICD-10-CM | POA: Diagnosis not present

## 2021-07-18 DIAGNOSIS — Z6832 Body mass index (BMI) 32.0-32.9, adult: Secondary | ICD-10-CM

## 2021-07-18 MED ORDER — RYBELSUS 7 MG PO TABS: 7.0000 mg | ORAL_TABLET | Freq: Every day | ORAL | 0 refills | Status: DC

## 2021-07-18 NOTE — Progress Notes (Signed)
Chief Complaint:   OBESITY Bobby Munoz is here to discuss his progress with his obesity treatment plan along with follow-up of his obesity related diagnoses. Bobby Munoz is on the Category 3 Plan and states he is following his eating plan approximately 70% of the time. Bobby Munoz states he is walking for 60 minutes 5 times per week.  Today's visit was #: 11 Starting weight: 206 lbs Starting date: 11/22/2020 Today's weight: 192 lbs Today's date: 07/18/2021 Total lbs lost to date: 14 lbs Total lbs lost since last in-office visit: 1 lb  Interim History: Bobby Munoz continues to enjoy the foods/structure of the Category 3 meal plan.  Subjective:   1. Hypertension associated with diabetes (HCC) Discussed labs with patient today.  On 06/20/2021, CMP - stable. BP/HR excellent at office visit.  2. Type 2 diabetes mellitus with other specified complication, without long-term current use of insulin (HCC) Discussed labs with patient today.  On 06/20/2021, B12 - 476 - within normal limits.  A1c 5.7 - excellent! He is on Rybelsus 7 mg daily.   He denies mass in neck, dysphagia, dyspepsia, or persistent hoarseness.  He denies polyphagia.  3. Vitamin D deficiency Discussed labs with patient today.  On 06/20/2021, vitamin D level - 71.3 - at goal. Ergocalciferol stopped. He is on OTC Men's Centrum Over 50.  4. At risk for constipation Bobby Munoz is at increased risk for constipation due to weight loss.  Assessment/Plan:   1. Hypertension associated with diabetes (HCC) Continue Norvasc 10 mg daily and Cozaar 100 mg daily.  2. Type 2 diabetes mellitus with other specified complication, without long-term current use of insulin (HCC) Refill Rybelsus 7 mg daily, as per below.  - Refill Semaglutide (RYBELSUS) 7 MG TABS; Take 7 mg by mouth daily.  Dispense: 30 tablet; Refill: 0  3. Vitamin D deficiency Continue OTC Centrum supplement.  4. At risk for constipation Bobby Munoz was given approximately 15 minutes of  counseling today regarding prevention of constipation. He was encouraged to increase water and fiber intake.    5. Obesity with current BMI 31.1  Bobby Munoz is currently in the action stage of change. As such, his goal is to continue with weight loss efforts. He has agreed to the Category 3 Plan.   Handout:  Thanksgiving Strategies.  Exercise goals:  As is.  Behavioral modification strategies: increasing lean protein intake, decreasing simple carbohydrates, meal planning and cooking strategies, keeping healthy foods in the home, and planning for success.  Bobby Munoz has agreed to follow-up with our clinic in 4 weeks. He was informed of the importance of frequent follow-up visits to maximize his success with intensive lifestyle modifications for his multiple health conditions.   Objective:   Blood pressure 118/73, pulse 63, temperature (!) 97.4 F (36.3 C), height 5\' 7"  (1.702 m), weight 192 lb (87.1 kg), SpO2 98 %. Body mass index is 30.07 kg/m.  General: Cooperative, alert, well developed, in no acute distress. HEENT: Conjunctivae and lids unremarkable. Cardiovascular: Regular rhythm.  Lungs: Normal work of breathing. Neurologic: No focal deficits.   Lab Results  Component Value Date   CREATININE 0.82 06/20/2021   BUN 12 06/20/2021   NA 142 06/20/2021   K 4.7 06/20/2021   CL 103 06/20/2021   CO2 24 06/20/2021   Lab Results  Component Value Date   ALT 24 06/20/2021   AST 23 06/20/2021   ALKPHOS 58 06/20/2021   BILITOT 0.6 06/20/2021   Lab Results  Component Value Date   HGBA1C 5.7 (  H) 06/20/2021   HGBA1C 6.5 (H) 03/07/2021   HGBA1C 6.1 (H) 11/22/2020   Lab Results  Component Value Date   INSULIN 10.1 06/20/2021   INSULIN 12.9 03/07/2021   INSULIN 16.6 11/22/2020   Lab Results  Component Value Date   TSH 2.370 11/22/2020   Lab Results  Component Value Date   CHOL 161 03/07/2021   HDL 69 03/07/2021   LDLCALC 75 03/07/2021   TRIG 90 03/07/2021   CHOLHDL 2.3  03/07/2021   Lab Results  Component Value Date   VD25OH 71.3 06/20/2021   VD25OH 41.4 03/07/2021   VD25OH 14.3 (L) 11/22/2020   Lab Results  Component Value Date   WBC 9.8 11/22/2020   HGB 15.3 11/22/2020   HCT 46.6 11/22/2020   MCV 97 11/22/2020   PLT 282 11/22/2020   Attestation Statements:   Reviewed by clinician on day of visit: allergies, medications, problem list, medical history, surgical history, family history, social history, and previous encounter notes.  I, Insurance claims handler, CMA, am acting as Energy manager for William Hamburger, NP.  I have reviewed the above documentation for accuracy and completeness, and I agree with the above. -  Yancy Knoble d. Everlina Gotts, NP-C

## 2021-08-02 ENCOUNTER — Other Ambulatory Visit: Payer: Self-pay | Admitting: Interventional Cardiology

## 2021-08-03 ENCOUNTER — Other Ambulatory Visit: Payer: Self-pay | Admitting: Interventional Cardiology

## 2021-08-15 ENCOUNTER — Ambulatory Visit (INDEPENDENT_AMBULATORY_CARE_PROVIDER_SITE_OTHER): Payer: Managed Care, Other (non HMO) | Admitting: Adult Health

## 2021-08-22 ENCOUNTER — Other Ambulatory Visit (INDEPENDENT_AMBULATORY_CARE_PROVIDER_SITE_OTHER): Payer: Self-pay | Admitting: Adult Health

## 2021-08-22 DIAGNOSIS — E1169 Type 2 diabetes mellitus with other specified complication: Secondary | ICD-10-CM

## 2021-09-02 ENCOUNTER — Other Ambulatory Visit: Payer: Self-pay | Admitting: Interventional Cardiology

## 2021-09-03 ENCOUNTER — Other Ambulatory Visit (INDEPENDENT_AMBULATORY_CARE_PROVIDER_SITE_OTHER): Payer: Self-pay | Admitting: Adult Health

## 2021-09-03 DIAGNOSIS — E1169 Type 2 diabetes mellitus with other specified complication: Secondary | ICD-10-CM

## 2021-09-03 NOTE — Telephone Encounter (Signed)
**PT CXD 08/15/21 appointment* LAST APPOINTMENT DATE: 07/18/21 NEXT APPOINTMENT DATE: 09/12/21 **PT CXD 08/15/21 appointment**  [id number] [place], [place] - [address] AT [facility] [address] [place] [address] Phone: [phone] Fax: [phone]  Patient is requesting a refill of the following medications: Pending Prescriptions:                       Disp   Refills   RYBELSUS 7 MG TABS [Pharmacy Med Name: RYB*30 tab*0       Sig: TAKE 1 TABLET BY MOUTH DAILY   Date last filled: 07/18/21 Previously prescribed by [facility]  Lab Results      Component                Value               Date                      HGBA1C                   5.7 (H)             06/20/2021                HGBA1C                   6.5 (H)             03/07/2021                HGBA1C                   6.1 (H)             11/22/2020           Lab Results      Component                Value               Date                      LDLCALC                  75                  03/07/2021                CREATININE               0.82                06/20/2021           Lab Results      Component                Value               Date                      VD25OH                   71.3                06/20/2021                VD25OH  41.4                03/07/2021                VD25OH                   14.3 (L)            11/22/2020            BP Readings from Last 3 Encounters: 07/18/21 : 118/73 07/16/21 : 110/82 06/20/21 : 121/77

## 2021-09-12 ENCOUNTER — Other Ambulatory Visit: Payer: Self-pay

## 2021-09-12 ENCOUNTER — Ambulatory Visit (INDEPENDENT_AMBULATORY_CARE_PROVIDER_SITE_OTHER): Payer: Managed Care, Other (non HMO) | Admitting: Adult Health

## 2021-09-12 ENCOUNTER — Encounter (INDEPENDENT_AMBULATORY_CARE_PROVIDER_SITE_OTHER): Payer: Self-pay | Admitting: Adult Health

## 2021-09-12 VITALS — BP 125/75 | HR 76 | Temp 97.8°F | Ht 67.0 in | Wt 196.0 lb

## 2021-09-12 DIAGNOSIS — E669 Obesity, unspecified: Secondary | ICD-10-CM | POA: Diagnosis not present

## 2021-09-12 DIAGNOSIS — Z6832 Body mass index (BMI) 32.0-32.9, adult: Secondary | ICD-10-CM | POA: Diagnosis not present

## 2021-09-12 DIAGNOSIS — E1169 Type 2 diabetes mellitus with other specified complication: Secondary | ICD-10-CM | POA: Diagnosis not present

## 2021-09-12 DIAGNOSIS — E559 Vitamin D deficiency, unspecified: Secondary | ICD-10-CM

## 2021-09-12 DIAGNOSIS — Z9189 Other specified personal risk factors, not elsewhere classified: Secondary | ICD-10-CM

## 2021-09-12 MED ORDER — RYBELSUS 7 MG PO TABS: 1.0000 | ORAL_TABLET | Freq: Every day | ORAL | 0 refills | Status: DC

## 2021-09-12 NOTE — Progress Notes (Signed)
Chief Complaint:   OBESITY Bobby Munoz is here to discuss his progress with his obesity treatment plan along with follow-up of his obesity related diagnoses. Bobby Munoz is on the Category 3 Plan and states he is following his eating plan approximately 50% of the time. Bobby Munoz states he is walking for 60 minutes 5 times per week.  Today's visit was #: 12 Starting weight: 206 lbs Starting date: 11/22/2020 Today's weight: 196 lbs Today's date: 09/12/2021 Total lbs lost to date: 10 lbs Total lbs lost since last in-office visit: 0  Interim History:  Bobby Munoz says he stayed home for the holidays. Early November he was down to 190 lbs. In November they celebrated his wife's birthday, Bobby Munoz Thanksgiving Then in December, they celebrated his birthday Celebrated Christmas Has been eating out a lot more frequently lately. His 30- year-old mother will be here for the month of January.  Subjective:   1. Type 2 diabetes mellitus with other specified complication, without long-term current use of insulin (HCC) A1c on 06/20/2021 - 5.7 - at goal! He is on Rybelsus 7 mg daily - however lapse in treatment for 3 weeks late Nov/early Dec - then refilled on 09/03/2021. When on Rybelsus 7mg - he denies mass in neck, dysphagia, dyspepsia, persistent hoarseness, or GI upset.  2. Vitamin D deficiency On 06/20/2021, vitamin D level - 71.3 - at goal. He is on OTC multivitamin.  3. At risk for hyperglycemia Bobby Munoz is at risk for hyperglycemia due to being off Rybelsus for several weeks.  Assessment/Plan:   1. Type 2 diabetes mellitus with other specified complication, without long-term current use of insulin (HCC) Refill Rybelsus 7 mg daily.  - Refill Semaglutide (RYBELSUS) 7 MG TABS; Take 1 tablet by mouth daily.  Dispense: 30 tablet; Refill: 0  2. Vitamin D deficiency Continue OTC Centrum multivitamin.   3. At risk for hyperglycemia Bobby Munoz was given approximately 15 minutes of counseling today regarding  prevention of hyperglycemia. He was advised of hyperglycemia causes and the fact hyperglycemia is often asymptomatic. Bobby Munoz was instructed to avoid skipping meals, eat regular protein rich meals and schedule low calorie but protein rich snacks as needed.   Repetitive spaced learning was employed today to elicit superior memory formation and behavioral change   4. Obesity with current BMI 30.8  Bobby Munoz is currently in the action stage of change. As such, his goal is to continue with weight loss efforts. He has agreed to the Category 3 Plan.   Exercise goals:  As is.  Behavioral modification strategies: increasing lean protein intake, decreasing simple carbohydrates, meal planning and cooking strategies, keeping healthy foods in the home, and planning for success.  Bobby Munoz has agreed to follow-up with our clinic in 3 weeks. He was informed of the importance of frequent follow-up visits to maximize his success with intensive lifestyle modifications for his multiple health conditions.   Objective:   Blood pressure 125/75, pulse 76, temperature 97.8 F (36.6 C), temperature source Oral, height 5\' 7"  (1.702 m), weight 196 lb (88.9 kg), SpO2 95 %. Body mass index is 30.7 kg/m.  General: Cooperative, alert, well developed, in no acute distress. HEENT: Conjunctivae and lids unremarkable. Cardiovascular: Regular rhythm.  Lungs: Normal work of breathing. Neurologic: No focal deficits.   Lab Results  Component Value Date   CREATININE 0.82 06/20/2021   BUN 12 06/20/2021   NA 142 06/20/2021   K 4.7 06/20/2021   CL 103 06/20/2021   CO2 24 06/20/2021   Lab Results  Component Value Date   ALT 24 06/20/2021   AST 23 06/20/2021   ALKPHOS 58 06/20/2021   BILITOT 0.6 06/20/2021   Lab Results  Component Value Date   HGBA1C 5.7 (H) 06/20/2021   HGBA1C 6.5 (H) 03/07/2021   HGBA1C 6.1 (H) 11/22/2020   Lab Results  Component Value Date   INSULIN 10.1 06/20/2021   INSULIN 12.9 03/07/2021    INSULIN 16.6 11/22/2020   Lab Results  Component Value Date   TSH 2.370 11/22/2020   Lab Results  Component Value Date   CHOL 161 03/07/2021   HDL 69 03/07/2021   LDLCALC 75 03/07/2021   TRIG 90 03/07/2021   CHOLHDL 2.3 03/07/2021   Lab Results  Component Value Date   VD25OH 71.3 06/20/2021   VD25OH 41.4 03/07/2021   VD25OH 14.3 (L) 11/22/2020   Lab Results  Component Value Date   WBC 9.8 11/22/2020   HGB 15.3 11/22/2020   HCT 46.6 11/22/2020   MCV 97 11/22/2020   PLT 282 11/22/2020   Attestation Statements:   Reviewed by clinician on day of visit: allergies, medications, problem list, medical history, surgical history, family history, social history, and previous encounter notes.  I, Insurance claims handler, CMA, am acting as Energy manager for William Hamburger, NP.  I have reviewed the above documentation for accuracy and completeness, and I agree with the above. -  Jordyan Hardiman d. Shakeel Disney, NP-C

## 2021-10-04 ENCOUNTER — Encounter (INDEPENDENT_AMBULATORY_CARE_PROVIDER_SITE_OTHER): Payer: Self-pay | Admitting: Adult Health

## 2021-10-04 ENCOUNTER — Ambulatory Visit (INDEPENDENT_AMBULATORY_CARE_PROVIDER_SITE_OTHER): Payer: Managed Care, Other (non HMO) | Admitting: Adult Health

## 2021-10-04 ENCOUNTER — Other Ambulatory Visit: Payer: Self-pay

## 2021-10-04 VITALS — BP 105/68 | HR 74 | Temp 97.7°F | Ht 67.0 in | Wt 192.0 lb

## 2021-10-04 DIAGNOSIS — E669 Obesity, unspecified: Secondary | ICD-10-CM

## 2021-10-04 DIAGNOSIS — Z6830 Body mass index (BMI) 30.0-30.9, adult: Secondary | ICD-10-CM

## 2021-10-04 DIAGNOSIS — E785 Hyperlipidemia, unspecified: Secondary | ICD-10-CM

## 2021-10-04 DIAGNOSIS — E559 Vitamin D deficiency, unspecified: Secondary | ICD-10-CM | POA: Diagnosis not present

## 2021-10-04 DIAGNOSIS — E1169 Type 2 diabetes mellitus with other specified complication: Secondary | ICD-10-CM

## 2021-10-04 DIAGNOSIS — Z9189 Other specified personal risk factors, not elsewhere classified: Secondary | ICD-10-CM | POA: Diagnosis not present

## 2021-10-04 MED ORDER — RYBELSUS 7 MG PO TABS: 1.0000 | ORAL_TABLET | Freq: Every day | ORAL | 0 refills | Status: DC

## 2021-10-05 LAB — COMPREHENSIVE METABOLIC PANEL
ALT: 21 IU/L (ref 0–44)
AST: 21 IU/L (ref 0–40)
Albumin/Globulin Ratio: 1.8 (ref 1.2–2.2)
Albumin: 4.6 g/dL (ref 3.8–4.8)
Alkaline Phosphatase: 61 IU/L (ref 44–121)
BUN/Creatinine Ratio: 17 (ref 10–24)
BUN: 16 mg/dL (ref 8–27)
Bilirubin Total: 0.8 mg/dL (ref 0.0–1.2)
CO2: 24 mmol/L (ref 20–29)
Calcium: 9.4 mg/dL (ref 8.6–10.2)
Chloride: 103 mmol/L (ref 96–106)
Creatinine, Ser: 0.92 mg/dL (ref 0.76–1.27)
Globulin, Total: 2.6 g/dL (ref 1.5–4.5)
Glucose: 103 mg/dL — ABNORMAL HIGH (ref 70–99)
Potassium: 4.3 mmol/L (ref 3.5–5.2)
Sodium: 142 mmol/L (ref 134–144)
Total Protein: 7.2 g/dL (ref 6.0–8.5)
eGFR: 92 mL/min/{1.73_m2} (ref >59)

## 2021-10-05 LAB — LIPID PANEL
Chol/HDL Ratio: 2.8 ratio (ref 0.0–5.0)
Cholesterol, Total: 110 mg/dL (ref 100–199)
HDL: 39 mg/dL — ABNORMAL LOW (ref >39)
LDL Chol Calc (NIH): 47 mg/dL (ref 0–99)
Triglycerides: 136 mg/dL (ref 0–149)
VLDL Cholesterol Cal: 24 mg/dL (ref 5–40)

## 2021-10-05 LAB — VITAMIN D 25 HYDROXY (VIT D DEFICIENCY, FRACTURES): Vit D, 25-Hydroxy: 35.8 ng/mL (ref 30.0–100.0)

## 2021-10-05 LAB — HEMOGLOBIN A1C
Est. average glucose Bld gHb Est-mCnc: 123 mg/dL
Hgb A1c MFr Bld: 5.9 % — ABNORMAL HIGH (ref 4.8–5.6)

## 2021-10-05 LAB — INSULIN, RANDOM: INSULIN: 13.5 u[IU]/mL (ref 2.6–24.9)

## 2021-10-08 NOTE — Progress Notes (Signed)
Chief Complaint:   OBESITY Bobby Munoz is here to discuss his progress with his obesity treatment plan along with follow-up of his obesity related diagnoses. Bobby Munoz is on the Category 3 Plan and states he is following his eating plan approximately 67% of the time. Bobby Munoz states he is walking for 60 minutes 5 times per week.  Today's visit was #: 13 Starting weight: 206 lbs Starting date: 11/22/2020 Today's weight: 192 lbs Today's date: 10/04/2021 Total lbs lost to date: 14 lbs Total lbs lost since last in-office visit: 4 lbs  Interim History:  Bobby Munoz' 2023 Goals: 1) Continue with weight loss efforts. 2) Increase exercise intensity, ie: weight lifting Retirement is in December 2023!!!  Subjective:   1. Type 2 diabetes mellitus with other specified complication, without long-term current use of insulin (HCC) He is on Rybelsus 7 mg -denies mass in neck, dysphagia, dyspepsia, persistent hoarseness or GI upset. He is able to consume all prescribed foods on plan with this oral GLP-1 dose.  2. Hyperlipidemia associated with type 2 diabetes mellitus (HCC) He is on the maximum dose of atorvastatin 80 mg - denies myalgias.  3. Vitamin D deficiency He is on an OTC multivitamin.  4. At risk for heart disease Bobby Munoz is at a higher than average risk for cardiovascular disease due to obesity.   Assessment/Plan:   1. Type 2 diabetes mellitus with other specified complication, without long-term current use of insulin (HCC) Refill Rybelsus 7 mg daily. Check labs daily.  - Hemoglobin A1c - Insulin, random - Refill Semaglutide (RYBELSUS) 7 MG TABS; Take 1 tablet by mouth daily.  Dispense: 30 tablet; Refill: 0  2. Hyperlipidemia associated with type 2 diabetes mellitus (HCC) Check labs today.  - Comprehensive metabolic panel - Lipid panel  3. Vitamin D deficiency Check vitamin D level today.  - VITAMIN D 25 Hydroxy (Vit-D Deficiency, Fractures)  4. At risk for heart disease Bobby Munoz was  given approximately 15 minutes of coronary artery disease prevention counseling today. He is 66 y.o. male and has risk factors for heart disease including obesity, HTN, HLD, T2D.  We discussed intensive lifestyle modifications today with an emphasis on specific weight loss instructions and strategies.   Repetitive spaced learning was employed today to elicit superior memory formation and behavioral change.  5. Obesity with current BMI 30.1  Bobby Munoz is currently in the action stage of change. As such, his goal is to continue with weight loss efforts. He has agreed to the Category 3 Plan.   Exercise goals:  As is.  Behavioral modification strategies: increasing lean protein intake, decreasing simple carbohydrates, meal planning and cooking strategies, keeping healthy foods in the home, and planning for success.  Bobby Munoz has agreed to follow-up with our clinic in 3-4 weeks. He was informed of the importance of frequent follow-up visits to maximize his success with intensive lifestyle modifications for his multiple health conditions.   Bobby Munoz was informed we would discuss his lab results at his next visit unless there is a critical issue that needs to be addressed sooner. Bobby Munoz agreed to keep his next visit at the agreed upon time to discuss these results.  Objective:   Blood pressure 105/68, pulse 74, temperature 97.7 F (36.5 C), height 5\' 7"  (1.702 m), weight 192 lb (87.1 kg), SpO2 99 %. Body mass index is 30.07 kg/m.  General: Cooperative, alert, well developed, in no acute distress. HEENT: Conjunctivae and lids unremarkable. Cardiovascular: Regular rhythm.  Lungs: Normal work of breathing. Neurologic: No  focal deficits.   Lab Results  Component Value Date   CREATININE 0.92 10/04/2021   BUN 16 10/04/2021   NA 142 10/04/2021   K 4.3 10/04/2021   CL 103 10/04/2021   CO2 24 10/04/2021   Lab Results  Component Value Date   ALT 21 10/04/2021   AST 21 10/04/2021   ALKPHOS 61  10/04/2021   BILITOT 0.8 10/04/2021   Lab Results  Component Value Date   HGBA1C 5.9 (H) 10/04/2021   HGBA1C 5.7 (H) 06/20/2021   HGBA1C 6.5 (H) 03/07/2021   HGBA1C 6.1 (H) 11/22/2020   Lab Results  Component Value Date   INSULIN 13.5 10/04/2021   INSULIN 10.1 06/20/2021   INSULIN 12.9 03/07/2021   INSULIN 16.6 11/22/2020   Lab Results  Component Value Date   TSH 2.370 11/22/2020   Lab Results  Component Value Date   CHOL 110 10/04/2021   HDL 39 (L) 10/04/2021   LDLCALC 47 10/04/2021   TRIG 136 10/04/2021   CHOLHDL 2.8 10/04/2021   Lab Results  Component Value Date   VD25OH 35.8 10/04/2021   VD25OH 71.3 06/20/2021   VD25OH 41.4 03/07/2021   Lab Results  Component Value Date   WBC 9.8 11/22/2020   HGB 15.3 11/22/2020   HCT 46.6 11/22/2020   MCV 97 11/22/2020   PLT 282 11/22/2020   Attestation Statements:   Reviewed by clinician on day of visit: allergies, medications, problem list, medical history, surgical history, family history, social history, and previous encounter notes.  I, Insurance claims handler, CMA, am acting as Energy manager for William Hamburger, NP.  I have reviewed the above documentation for accuracy and completeness, and I agree with the above. - Pairlee Sawtell d. Claudene Gatliff, NP-C

## 2021-10-30 ENCOUNTER — Other Ambulatory Visit: Payer: Self-pay

## 2021-10-30 ENCOUNTER — Encounter (INDEPENDENT_AMBULATORY_CARE_PROVIDER_SITE_OTHER): Payer: Self-pay | Admitting: Adult Health

## 2021-10-30 ENCOUNTER — Ambulatory Visit (INDEPENDENT_AMBULATORY_CARE_PROVIDER_SITE_OTHER): Payer: Managed Care, Other (non HMO) | Admitting: Adult Health

## 2021-10-30 VITALS — BP 109/69 | HR 68 | Temp 97.8°F | Ht 67.0 in | Wt 197.0 lb

## 2021-10-30 DIAGNOSIS — E1169 Type 2 diabetes mellitus with other specified complication: Secondary | ICD-10-CM

## 2021-10-30 DIAGNOSIS — E669 Obesity, unspecified: Secondary | ICD-10-CM | POA: Diagnosis not present

## 2021-10-30 DIAGNOSIS — E559 Vitamin D deficiency, unspecified: Secondary | ICD-10-CM

## 2021-10-30 DIAGNOSIS — Z7984 Long term (current) use of oral hypoglycemic drugs: Secondary | ICD-10-CM

## 2021-10-30 DIAGNOSIS — E785 Hyperlipidemia, unspecified: Secondary | ICD-10-CM | POA: Diagnosis not present

## 2021-10-30 DIAGNOSIS — Z6830 Body mass index (BMI) 30.0-30.9, adult: Secondary | ICD-10-CM

## 2021-10-30 DIAGNOSIS — Z9189 Other specified personal risk factors, not elsewhere classified: Secondary | ICD-10-CM

## 2021-10-30 DIAGNOSIS — Z6833 Body mass index (BMI) 33.0-33.9, adult: Secondary | ICD-10-CM

## 2021-10-30 MED ORDER — OZEMPIC (0.25 OR 0.5 MG/DOSE) 2 MG/1.5ML ~~LOC~~ SOPN: 0.5000 mg | PEN_INJECTOR | SUBCUTANEOUS | 0 refills | Status: DC

## 2021-10-30 MED ORDER — VITAMIN D (ERGOCALCIFEROL) 1.25 MG (50000 UNIT) PO CAPS: 50000.0000 [IU] | ORAL_CAPSULE | ORAL | 0 refills | Status: DC

## 2021-10-30 NOTE — Progress Notes (Signed)
Chief Complaint:   OBESITY Bobby Munoz is here to discuss his progress with his obesity treatment plan along with follow-up of his obesity related diagnoses. Bobby Munoz is on the Category 3 Plan and states he is following his eating plan approximately 72% of the time. Bobby Munoz states he is walking for 30 minutes 5 times per week.  Today's visit was #: 14 Starting weight: 206 lbs Starting date: 11/22/2020 Today's weight: 197 lbs Today's date: 10/30/2021 Total lbs lost to date: 9 lbs Total lbs lost since last in-office visit: 0  Interim History:  Worsening chronic lumbar pain - scheduled ablation on 11/08/2021 - 3rd ablation. Walking has been difficult due to increased pain.  Subjective:   1. Type 2 diabetes mellitus with other specified complication, without long-term current use of insulin (HCC) Worsening.  Discussed labs with patient today.  On 10/04/2021, BG 103, A1c 5.9, insulin 13.5 - all levels worsened. He is on Rybelsus 7 mg daily. On 10/04/2021 - CMP - stable.  2. Vitamin D deficiency Worsening.  Discussed labs with patient today.  On 10/04/2021, vitamin D level - 35.8 - below goal of 50-70. He is on an OTC multivitamin.  3. Hyperlipidemia associated with type 2 diabetes mellitus (HCC) Discussed labs with patient today.  On 10/04/2021, lipid panel - total 110, HDL 39, TG 136, LDL 47. He is on max dose atorvastatin 80 mg - been on this dose since early 2016.  ON 10/04/2021, CMP - stable.  4. At risk for nausea Bobby Munoz is at risk for nausea due to stopping Rybelsus and starting Ozempic.  Assessment/Plan:   1. Type 2 diabetes mellitus with other specified complication, without long-term current use of insulin (HCC) Stop Rybelsus. Start Ozempic 0.5 mg once weekly.  - Start Semaglutide,0.25 or 0.5MG /DOS, (OZEMPIC, 0.25 OR 0.5 MG/DOSE,) 2 MG/1.5ML SOPN; Inject 0.5 mg into the skin once a week.  Dispense: 2 mL; Refill: 0  2. Vitamin D deficiency Continue multivitamin. Start  ergocalciferol 50,000 IU once weekly.  - Start Vitamin D, Ergocalciferol, (DRISDOL) 1.25 MG (50000 UNIT) CAPS capsule; Take 1 capsule (50,000 Units total) by mouth every 7 (seven) days.  Dispense: 4 capsule; Refill: 0  3. Hyperlipidemia associated with type 2 diabetes mellitus (HCC) Monitor labs every 3 months. Follow-up with Cardiology about statin dose.  4. At risk for nausea Bobby Munoz was given approximately 15 minutes of nausea prevention counseling today. Bobby Munoz is at risk for nausea due to his new or current medication. He was encouraged to titrate his medication slowly, make sure to stay hydrated, eat smaller portions throughout the day, and avoid high fat meals.   5. Obesity with current BMI 30.9  [name] is currently in the action stage of change. As such, his goal is to continue with weight loss efforts. He has agreed to the Category 3 Plan.   Exercise goals:  As is.  Behavioral modification strategies: increasing lean protein intake, decreasing simple carbohydrates, meal planning and cooking strategies, keeping healthy foods in the home, and planning for success.  Bobby Munoz has agreed to follow-up with our clinic in 3 weeks. He was informed of the importance of frequent follow-up visits to maximize his success with intensive lifestyle modifications for his multiple health conditions.   Objective:   Blood pressure 109/69, pulse 68, temperature 97.8 F (36.6 C), height 5\' 7"  (1.702 m), weight 197 lb (89.4 kg), SpO2 99 %. Body mass index is 30.85 kg/m.  General: Cooperative, alert, well developed, in no acute  distress. HEENT: Conjunctivae and lids unremarkable. Cardiovascular: Regular rhythm.  Lungs: Normal work of breathing. Neurologic: No focal deficits.   Lab Results  Component Value Date   CREATININE 0.92 10/04/2021   BUN 16 10/04/2021   NA 142 10/04/2021   K 4.3 10/04/2021   CL 103 10/04/2021   CO2 24 10/04/2021   Lab Results  Component Value Date   ALT 21  10/04/2021   AST 21 10/04/2021   ALKPHOS 61 10/04/2021   BILITOT 0.8 10/04/2021   Lab Results  Component Value Date   HGBA1C 5.9 (H) 10/04/2021   HGBA1C 5.7 (H) 06/20/2021   HGBA1C 6.5 (H) 03/07/2021   HGBA1C 6.1 (H) 11/22/2020   Lab Results  Component Value Date   INSULIN 13.5 10/04/2021   INSULIN 10.1 06/20/2021   INSULIN 12.9 03/07/2021   INSULIN 16.6 11/22/2020   Lab Results  Component Value Date   TSH 2.370 11/22/2020   Lab Results  Component Value Date   CHOL 110 10/04/2021   HDL 39 (L) 10/04/2021   LDLCALC 47 10/04/2021   TRIG 136 10/04/2021   CHOLHDL 2.8 10/04/2021   Lab Results  Component Value Date   VD25OH 35.8 10/04/2021   VD25OH 71.3 06/20/2021   VD25OH 41.4 03/07/2021   Lab Results  Component Value Date   WBC 9.8 11/22/2020   HGB 15.3 11/22/2020   HCT 46.6 11/22/2020   MCV 97 11/22/2020   PLT 282 11/22/2020   Attestation Statements:   Reviewed by clinician on day of visit: allergies, medications, problem list, medical history, surgical history, family history, social history, and previous encounter notes.  I, Insurance claims handler, CMA, am acting as transcriptionist for Helane Rima, DO  I have reviewed the above documentation for accuracy and completeness, and I agree with the above. -  Chelsea Nusz d. Brandan Glauber, NP-C

## 2021-11-21 ENCOUNTER — Encounter (INDEPENDENT_AMBULATORY_CARE_PROVIDER_SITE_OTHER): Payer: Self-pay

## 2021-11-21 ENCOUNTER — Ambulatory Visit (INDEPENDENT_AMBULATORY_CARE_PROVIDER_SITE_OTHER): Payer: Managed Care, Other (non HMO) | Admitting: Adult Health

## 2021-11-21 ENCOUNTER — Encounter (INDEPENDENT_AMBULATORY_CARE_PROVIDER_SITE_OTHER): Payer: Self-pay | Admitting: Adult Health

## 2021-11-21 ENCOUNTER — Other Ambulatory Visit: Payer: Self-pay

## 2021-11-22 ENCOUNTER — Other Ambulatory Visit (INDEPENDENT_AMBULATORY_CARE_PROVIDER_SITE_OTHER): Payer: Self-pay | Admitting: Adult Health

## 2021-11-22 DIAGNOSIS — E559 Vitamin D deficiency, unspecified: Secondary | ICD-10-CM

## 2021-11-23 ENCOUNTER — Other Ambulatory Visit (INDEPENDENT_AMBULATORY_CARE_PROVIDER_SITE_OTHER): Payer: Self-pay | Admitting: Adult Health

## 2021-11-23 DIAGNOSIS — E1169 Type 2 diabetes mellitus with other specified complication: Secondary | ICD-10-CM

## 2021-11-24 ENCOUNTER — Other Ambulatory Visit (INDEPENDENT_AMBULATORY_CARE_PROVIDER_SITE_OTHER): Payer: Self-pay | Admitting: Adult Health

## 2021-11-24 DIAGNOSIS — E1169 Type 2 diabetes mellitus with other specified complication: Secondary | ICD-10-CM

## 2021-12-03 ENCOUNTER — Other Ambulatory Visit: Payer: Self-pay

## 2021-12-03 ENCOUNTER — Ambulatory Visit (INDEPENDENT_AMBULATORY_CARE_PROVIDER_SITE_OTHER): Payer: Managed Care, Other (non HMO) | Admitting: Nurse Practitioner

## 2021-12-03 ENCOUNTER — Encounter (INDEPENDENT_AMBULATORY_CARE_PROVIDER_SITE_OTHER): Payer: Self-pay | Admitting: Nurse Practitioner

## 2021-12-03 VITALS — BP 107/71 | HR 59 | Temp 97.6°F | Ht 67.0 in | Wt 192.0 lb

## 2021-12-03 DIAGNOSIS — E559 Vitamin D deficiency, unspecified: Secondary | ICD-10-CM

## 2021-12-03 DIAGNOSIS — E1169 Type 2 diabetes mellitus with other specified complication: Secondary | ICD-10-CM

## 2021-12-03 DIAGNOSIS — E669 Obesity, unspecified: Secondary | ICD-10-CM

## 2021-12-03 DIAGNOSIS — Z6830 Body mass index (BMI) 30.0-30.9, adult: Secondary | ICD-10-CM

## 2021-12-03 DIAGNOSIS — E785 Hyperlipidemia, unspecified: Secondary | ICD-10-CM

## 2021-12-03 DIAGNOSIS — E1159 Type 2 diabetes mellitus with other circulatory complications: Secondary | ICD-10-CM

## 2021-12-03 DIAGNOSIS — Z9189 Other specified personal risk factors, not elsewhere classified: Secondary | ICD-10-CM

## 2021-12-03 DIAGNOSIS — Z7985 Long-term (current) use of injectable non-insulin antidiabetic drugs: Secondary | ICD-10-CM

## 2021-12-03 DIAGNOSIS — I152 Hypertension secondary to endocrine disorders: Secondary | ICD-10-CM | POA: Diagnosis not present

## 2021-12-03 MED ORDER — OZEMPIC (0.25 OR 0.5 MG/DOSE) 2 MG/1.5ML ~~LOC~~ SOPN: 0.5000 mg | PEN_INJECTOR | SUBCUTANEOUS | 0 refills | Status: DC

## 2021-12-03 MED ORDER — VITAMIN D (ERGOCALCIFEROL) 1.25 MG (50000 UNIT) PO CAPS: 50000.0000 [IU] | ORAL_CAPSULE | ORAL | 0 refills | Status: DC

## 2021-12-03 NOTE — Progress Notes (Signed)
? ? ? ?Chief Complaint:  ? ?OBESITY ?Bobby Munoz is here to discuss his progress with his obesity treatment plan along with follow-up of his obesity related diagnoses. Bobby Munoz is on the Category 3 Plan and states he is following his eating plan approximately 70% of the time. Bobby Munoz states he is walking for 60 minutes 5 times per week. ? ?Today's visit was #: 15 ?Starting weight: 206 lbs ?Starting date: 11/22/2020 ?Today's weight: 192 lbs ?Today's date: 12/03/2021 ?Total lbs lost to date: 14 lbs ?Total lbs lost since last in-office visit: 5 lbs ? ?Interim History: Bobby Munoz has done well with weight loss.  He is meeting his calorie and protein goals.  [place] is going to [place] next [place].  He continues to struggle with back pain.  Pain has been exacerbated by caring/lifting his 65 lb dog. ? ?Subjective:  ? ?1. Type 2 diabetes mellitus with other specified complication, without long-term current use of insulin (HCC) ?Last A1c was 5.9.  He is taking Ozempic 0.5.  Denies side effects.  He has taken Rybelsus.  He is on a statin and ARB. ? ?2. Hyperlipidemia associated with type 2 diabetes mellitus (HCC) ?He is taking Lipitor 80 mg.  Denies side effects.  He is seeing Cardiology yearly.  He last saw Cardiology on 07/16/2021. ? ?3. Vitamin D deficiency ?Last vitamin D was 35.8.  He is taking vitamin D 50,000 IU weekly.  Denies side effects of nausea, vomiting, or muscle weakness. ? ?4. Hypertension associated with diabetes (HCC) ?BP looked great today.  He sees Cardiology yearly.  Denies CP/palpitations/shortness of breath.  Saw cardiology last on 07/16/21.  He is walking on his treadmill 5 days per week   ? ?5. At risk for constipation ?Bobby Munoz is at increased risk for constipation due to taking a GLP-1 for DMT2. ? ?Assessment/Plan:  ? ?1. Type 2 diabetes mellitus with other specified complication, without long-term current use of insulin (HCC) ?Refill Ozempic 0.5 mg once weekly.  Side effects discussed.  Good blood sugar control is important to  decrease the likelihood of diabetic complications such as nephropathy, neuropathy, limb loss, blindness, coronary artery disease, and death. Intensive lifestyle modification including diet, exercise and weight loss are the first line of treatment for diabetes.  ? ?- Refill Semaglutide,0.25 or 0.5MG /DOS, (OZEMPIC, 0.25 OR 0.5 MG/DOSE,) 2 MG/1.5ML SOPN; Inject 0.5 mg into the skin once a week.  Dispense: 2 mL; Refill: 0 ? ?2. Hyperlipidemia associated with type 2 diabetes mellitus (HCC) ?Continue to follow-up with Cardiology.  Continue medications as directed.  Cardiovascular risk and specific lipid/LDL goals reviewed.  We discussed several lifestyle modifications today and Bobby Munoz will continue to work on diet, exercise and weight loss efforts. Orders and follow up as documented in patient record.    ? ?Counseling ?Intensive lifestyle modifications are the first line treatment for this issue. ?Dietary changes: Increase soluble fiber. Decrease simple carbohydrates. ?Exercise changes: Moderate to vigorous-intensity aerobic activity 150 minutes per week if tolerated. ?Lipid-lowering medications: see documented in medical record. ? ?3. Vitamin D deficiency ?Low Vitamin D level contributes to fatigue and are associated with obesity, breast, and colon cancer. He agrees to continue to take prescription Vitamin D @50 ,000 IU every week and will follow-up for routine testing of Vitamin D, at least 2-3 times per year to avoid over-replacement.  We will refill vitamin D 50,000 IU weekly today ? ?- Refill Vitamin D, Ergocalciferol, (DRISDOL) 1.25 MG (50000 UNIT) CAPS capsule; Take 1 capsule (50,000 Units total) by mouth every  7 (seven) days.  Dispense: 4 capsule; Refill: 0 ? ?4. Hypertension associated with diabetes (HCC) ?Continue to follow-up with Cardiology.  Continue medications as directed.  Bobby Munoz is working on healthy weight loss and exercise to improve blood pressure control. We will watch for signs of hypotension as he  continues his lifestyle modifications. ? ?5. At risk for constipation ?Bobby Munoz was given approximately 15 minutes of counseling today regarding prevention of constipation. He was encouraged to increase water and fiber intake.  ? ?6. Obesity with current BMI 30.2 ? ?Bobby Munoz is currently in the action stage of change. As such, his goal is to continue with weight loss efforts. He has agreed to the Category 3 Plan.  ? ?Exercise goals:  As is. ? ?Behavioral modification strategies: increasing lean protein intake, increasing water intake, no skipping meals, meal planning and cooking strategies, and travel eating strategies. ? ?Bobby Munoz has agreed to follow-up with our clinic in 3 weeks. He was informed of the importance of frequent follow-up visits to maximize his success with intensive lifestyle modifications for his multiple health conditions.  ? ?Objective:  ? ?Blood pressure 107/71, pulse (!) 59, temperature 97.6 ?F (36.4 ?C), height 5\' 7"  (1.702 m), weight 192 lb (87.1 kg), SpO2 97 %. ?Body mass index is 30.07 kg/m?. ? ?General: Cooperative, alert, well developed, in no acute distress. ?HEENT: Conjunctivae and lids unremarkable. ?Cardiovascular: Regular rhythm.  ?Lungs: Normal work of breathing. ?Neurologic: No focal deficits.  ? ?Lab Results  ?Component Value Date  ? CREATININE 0.92 10/04/2021  ? BUN 16 10/04/2021  ? NA 142 10/04/2021  ? K 4.3 10/04/2021  ? CL 103 10/04/2021  ? CO2 24 10/04/2021  ? ?Lab Results  ?Component Value Date  ? ALT 21 10/04/2021  ? AST 21 10/04/2021  ? ALKPHOS 61 10/04/2021  ? BILITOT 0.8 10/04/2021  ? ?Lab Results  ?Component Value Date  ? HGBA1C 5.9 (H) 10/04/2021  ? HGBA1C 5.7 (H) 06/20/2021  ? HGBA1C 6.5 (H) 03/07/2021  ? HGBA1C 6.1 (H) 11/22/2020  ? ?Lab Results  ?Component Value Date  ? INSULIN 13.5 10/04/2021  ? INSULIN 10.1 06/20/2021  ? INSULIN 12.9 03/07/2021  ? INSULIN 16.6 11/22/2020  ? ?Lab Results  ?Component Value Date  ? TSH 2.370 11/22/2020  ? ?Lab Results  ?Component Value Date   ? CHOL 110 10/04/2021  ? HDL 39 (L) 10/04/2021  ? LDLCALC 47 10/04/2021  ? TRIG 136 10/04/2021  ? CHOLHDL 2.8 10/04/2021  ? ?Lab Results  ?Component Value Date  ? VD25OH 35.8 10/04/2021  ? VD25OH 71.3 06/20/2021  ? VD25OH 41.4 03/07/2021  ? ?Lab Results  ?Component Value Date  ? WBC 9.8 11/22/2020  ? HGB 15.3 11/22/2020  ? HCT 46.6 11/22/2020  ? MCV 97 11/22/2020  ? PLT 282 11/22/2020  ? ?Attestation Statements:  ? ?Reviewed by clinician on day of visit: allergies, medications, problem list, medical history, surgical history, family history, social history, and previous encounter notes. ? ?I, 01/22/2021, CMA, am acting as transcriptionist for Insurance claims handler, FNP. ? ?I have reviewed the above documentation for accuracy and completeness, and I agree with the above. Irene Limbo, FNP  ?

## 2022-01-02 ENCOUNTER — Encounter (INDEPENDENT_AMBULATORY_CARE_PROVIDER_SITE_OTHER): Payer: Self-pay | Admitting: Adult Health

## 2022-01-02 ENCOUNTER — Ambulatory Visit (INDEPENDENT_AMBULATORY_CARE_PROVIDER_SITE_OTHER): Payer: Managed Care, Other (non HMO) | Admitting: Adult Health

## 2022-01-02 VITALS — BP 145/94 | HR 82 | Temp 98.0°F | Ht 67.0 in | Wt 188.6 lb

## 2022-01-02 DIAGNOSIS — E66811 Obesity, class 1: Secondary | ICD-10-CM

## 2022-01-02 DIAGNOSIS — E559 Vitamin D deficiency, unspecified: Secondary | ICD-10-CM

## 2022-01-02 DIAGNOSIS — Z7985 Long-term (current) use of injectable non-insulin antidiabetic drugs: Secondary | ICD-10-CM

## 2022-01-02 DIAGNOSIS — E1169 Type 2 diabetes mellitus with other specified complication: Secondary | ICD-10-CM

## 2022-01-02 DIAGNOSIS — E669 Obesity, unspecified: Secondary | ICD-10-CM

## 2022-01-02 DIAGNOSIS — Z6829 Body mass index (BMI) 29.0-29.9, adult: Secondary | ICD-10-CM

## 2022-01-02 MED ORDER — VITAMIN D (ERGOCALCIFEROL) 1.25 MG (50000 UNIT) PO CAPS: 50000.0000 [IU] | ORAL_CAPSULE | ORAL | 0 refills | Status: DC

## 2022-01-02 MED ORDER — OZEMPIC (0.25 OR 0.5 MG/DOSE) 2 MG/1.5ML ~~LOC~~ SOPN: 0.5000 mg | PEN_INJECTOR | SUBCUTANEOUS | 0 refills | Status: DC

## 2022-01-02 MED ORDER — SEMAGLUTIDE (1 MG/DOSE) 4 MG/3ML ~~LOC~~ SOPN: 1.0000 mg | PEN_INJECTOR | SUBCUTANEOUS | 0 refills | Status: DC

## 2022-01-15 ENCOUNTER — Telehealth (INDEPENDENT_AMBULATORY_CARE_PROVIDER_SITE_OTHER): Payer: Self-pay | Admitting: Adult Health

## 2022-01-15 ENCOUNTER — Encounter (INDEPENDENT_AMBULATORY_CARE_PROVIDER_SITE_OTHER): Payer: Self-pay

## 2022-01-15 NOTE — Progress Notes (Signed)
? ? ? ?Chief Complaint:  ? ?OBESITY ?Bobby Munoz is here to discuss his progress with his obesity treatment plan along with follow-up of his obesity related diagnoses. Bobby Munoz is on the Category 3 Plan and states he is following his eating plan approximately 70% of the time. Bobby Munoz states he is walking 60 minutes 5 times per week. ? ?Today's visit was #: 16 ?Starting weight: 206 lbs ?Starting date: 11/22/2020 ?Today's weight: 188 lbs ?Today's date: 01/02/22 ?Total lbs lost to date: 18 ?Total lbs lost since last in-office visit: 4 ? ?Interim History:  ?Bobby Munoz was on Rybelus 7mg  daily, 10/30/21, that has been replaced Ozempic 0.5mg .  ?Bobby Munoz has been on this strength for more then 8 weeks. Injection on Sundays - he denies medication SE. ? ?Of Note-[employer] will retire in Dec 2023- CONGRATULATIONS! ? ?Subjective:  ? ?1. Type 2 diabetes mellitus with other specified complication, without long-term current use of insulin (HCC) ?Bobby Munoz was on Rybelus 7mg  daily, 10/30/21, that has been replaced Ozempic 0.5mg .  ?Bobby Munoz has been on this strength for more then 8 weeks. Injection on Sundays - he denies mass in neck, dysphagia, dyspepsia, persistent hoarseness, and pain, or N/V/Constipation. ? ?2. Vitamin D deficiency ?On 10/04/21, Bobby Munoz's Vit D level -35.87. This will help goal of 50-70.  ?He is currently taking ergocalciferol.  ?He denies any nausea, vomiting or muscle weakness. ? ?Assessment/Plan:  ? ?1. Type 2 diabetes mellitus with other specified complication, without long-term current use of insulin (HCC) ?We will refill Ozempic 1mg  once weekly. ? ?2. Vitamin D deficiency ?We will refill ergocalciferol 50,000 IU once weekly. ?- Refill Vitamin D, Ergocalciferol, (DRISDOL) 1.25 MG (50000 UNIT) CAPS capsule; Take 1 capsule (50,000 Units total) by mouth every 7 (seven) days.  Dispense: 4 capsule; Refill: 0 ? ?3. Obesity with current BMI 29.5 ?Bobby Munoz is currently in the action stage of change. As such, his goal is to continue with weight loss  efforts. He has agreed to the Category 3 Plan.  ? ?Bobby Munoz's 40th wedding anniversary -will be in 10/06/21, August. We will check fasting labs at next visit and he will perform  IC. He is aware to arrive 30 mins before appointment. ? ?Exercise goals: As is. ? ?Behavioral modification strategies: increasing lean protein intake, decreasing simple carbohydrates, meal planning and cooking strategies, keeping healthy foods in the home, and planning for success. ? ?Bobby Munoz has agreed to follow-up with our clinic in 4 weeks. He was informed of the importance of frequent follow-up visits to maximize his success with intensive lifestyle modifications for his multiple health conditions.  ? ?Objective:  ? ?Blood pressure (!) 145/94, pulse 82, temperature 98 ?F (36.7 ?C), height 5\' 7"  (1.702 m), weight 188 lb 9.6 oz (85.5 kg), SpO2 98 %. ?Body mass index is 29.54 kg/m?. ? ?General: Cooperative, alert, well developed, in no acute distress. ?HEENT: Conjunctivae and lids unremarkable. ?Cardiovascular: Regular rhythm.  ?Lungs: Normal work of breathing. ?Neurologic: No focal deficits.  ? ?Lab Results  ?Component Value Date  ? CREATININE 0.92 10/04/2021  ? BUN 16 10/04/2021  ? NA 142 10/04/2021  ? K 4.3 10/04/2021  ? CL 103 10/04/2021  ? CO2 24 10/04/2021  ? ?Lab Results  ?Component Value Date  ? ALT 21 10/04/2021  ? AST 21 10/04/2021  ? ALKPHOS 61 10/04/2021  ? BILITOT 0.8 10/04/2021  ? ?Lab Results  ?Component Value Date  ? HGBA1C 5.9 (H) 10/04/2021  ? HGBA1C 5.7 (H) 06/20/2021  ? HGBA1C 6.5 (H) 03/07/2021  ? HGBA1C  6.1 (H) 11/22/2020  ? ?Lab Results  ?Component Value Date  ? INSULIN 13.5 10/04/2021  ? INSULIN 10.1 06/20/2021  ? INSULIN 12.9 03/07/2021  ? INSULIN 16.6 11/22/2020  ? ?Lab Results  ?Component Value Date  ? TSH 2.370 11/22/2020  ? ?Lab Results  ?Component Value Date  ? CHOL 110 10/04/2021  ? HDL 39 (L) 10/04/2021  ? LDLCALC 47 10/04/2021  ? TRIG 136 10/04/2021  ? CHOLHDL 2.8 10/04/2021  ? ?Lab Results  ?Component Value  Date  ? VD25OH 35.8 10/04/2021  ? VD25OH 71.3 06/20/2021  ? VD25OH 41.4 03/07/2021  ? ?Lab Results  ?Component Value Date  ? WBC 9.8 11/22/2020  ? HGB 15.3 11/22/2020  ? HCT 46.6 11/22/2020  ? MCV 97 11/22/2020  ? PLT 282 11/22/2020  ? ?No results found for: IRON, TIBC, FERRITIN ? ?Attestation Statements:  ? ?Reviewed by clinician on day of visit: allergies, medications, problem list, medical history, surgical history, family history, social history, and previous encounter notes. ? ?I, Manasa Spease, RMA, am acting as transcriptionist for William Hamburger, NP. ? ?I have reviewed the above documentation for accuracy and completeness, and I agree with the above. -  Katy d. Danford, NP-C ?

## 2022-01-15 NOTE — Telephone Encounter (Signed)
Bobby Munoz - Prior authorization approved for [employer]. Patient sent approval message via mychart.  ?

## 2022-01-26 ENCOUNTER — Other Ambulatory Visit (INDEPENDENT_AMBULATORY_CARE_PROVIDER_SITE_OTHER): Payer: Self-pay | Admitting: Adult Health

## 2022-01-26 DIAGNOSIS — E559 Vitamin D deficiency, unspecified: Secondary | ICD-10-CM

## 2022-01-31 ENCOUNTER — Ambulatory Visit (INDEPENDENT_AMBULATORY_CARE_PROVIDER_SITE_OTHER): Payer: Managed Care, Other (non HMO) | Admitting: Adult Health

## 2022-01-31 ENCOUNTER — Encounter (INDEPENDENT_AMBULATORY_CARE_PROVIDER_SITE_OTHER): Payer: Self-pay | Admitting: Adult Health

## 2022-01-31 VITALS — BP 101/68 | HR 72 | Temp 97.0°F | Ht 67.0 in | Wt 190.0 lb

## 2022-01-31 DIAGNOSIS — E1169 Type 2 diabetes mellitus with other specified complication: Secondary | ICD-10-CM | POA: Diagnosis not present

## 2022-01-31 DIAGNOSIS — Z6829 Body mass index (BMI) 29.0-29.9, adult: Secondary | ICD-10-CM | POA: Diagnosis not present

## 2022-01-31 DIAGNOSIS — E669 Obesity, unspecified: Secondary | ICD-10-CM | POA: Diagnosis not present

## 2022-01-31 DIAGNOSIS — E559 Vitamin D deficiency, unspecified: Secondary | ICD-10-CM | POA: Diagnosis not present

## 2022-01-31 DIAGNOSIS — Z7985 Long-term (current) use of injectable non-insulin antidiabetic drugs: Secondary | ICD-10-CM

## 2022-01-31 MED ORDER — VITAMIN D (ERGOCALCIFEROL) 1.25 MG (50000 UNIT) PO CAPS: 50000.0000 [IU] | ORAL_CAPSULE | ORAL | 0 refills | Status: DC

## 2022-01-31 MED ORDER — SEMAGLUTIDE (1 MG/DOSE) 4 MG/3ML ~~LOC~~ SOPN: 1.0000 mg | PEN_INJECTOR | SUBCUTANEOUS | 0 refills | Status: DC

## 2022-02-01 LAB — COMPREHENSIVE METABOLIC PANEL
ALT: 31 IU/L (ref 0–44)
AST: 27 IU/L (ref 0–40)
Albumin/Globulin Ratio: 1.7 (ref 1.2–2.2)
Albumin: 4.5 g/dL (ref 3.8–4.8)
Alkaline Phosphatase: 61 IU/L (ref 44–121)
BUN/Creatinine Ratio: 16 (ref 10–24)
BUN: 12 mg/dL (ref 8–27)
Bilirubin Total: 0.4 mg/dL (ref 0.0–1.2)
CO2: 22 mmol/L (ref 20–29)
Calcium: 9.3 mg/dL (ref 8.6–10.2)
Chloride: 103 mmol/L (ref 96–106)
Creatinine, Ser: 0.77 mg/dL (ref 0.76–1.27)
Globulin, Total: 2.6 g/dL (ref 1.5–4.5)
Glucose: 95 mg/dL (ref 70–99)
Potassium: 4.4 mmol/L (ref 3.5–5.2)
Sodium: 143 mmol/L (ref 134–144)
Total Protein: 7.1 g/dL (ref 6.0–8.5)
eGFR: 99 mL/min/{1.73_m2} (ref >59)

## 2022-02-01 LAB — VITAMIN D 25 HYDROXY (VIT D DEFICIENCY, FRACTURES): Vit D, 25-Hydroxy: 46.3 ng/mL (ref 30.0–100.0)

## 2022-02-01 LAB — HEMOGLOBIN A1C
Est. average glucose Bld gHb Est-mCnc: 117 mg/dL
Hgb A1c MFr Bld: 5.7 % — ABNORMAL HIGH (ref 4.8–5.6)

## 2022-02-01 LAB — INSULIN, RANDOM: INSULIN: 13.8 u[IU]/mL (ref 2.6–24.9)

## 2022-02-06 NOTE — Progress Notes (Signed)
Chief Complaint:   OBESITY Bobby Munoz is here to discuss his progress with his obesity treatment plan along with follow-up of his obesity related diagnoses. Bobby Munoz is on the Category 3 Plan and states he is following his eating plan approximately 67% of the time. Bobby Munoz states he is walking  60 minutes 5 times per week.  Today's visit was #: 17 Starting weight: 206 lbs Starting date: 11/22/2020 Today's weight: 190 lbs Today's date: 01/31/2022 Total lbs lost to date: 16 Total lbs lost since last in-office visit: 0  Interim History:  Bobby Munoz was unable to complete IC today.   Bobby Munoz 7 mg replaced with Ozempic 0.5 mg on 10/30/21.  Ozempic increased to 1 mg on 01/02/2022, he has had 3 doses at that strength and tolerating well.   Subjective:   1. Type 2 diabetes mellitus with other specified complication, without long-term current use of insulin (HCC) Bobby Munoz 7 mg replaced with Ozempic 0.5 mg on 10/30/21.  Ozempic increased to 1 mg on 01/02/2022, he has had 3 doses at that strength and tolerating well.  Bobby Munoz denies symptoms of hypoglycemia.  2. Vitamin D deficiency Bobby Munoz is taking ergocalciferol. Denies any nausea, vomiting or muscle weakness.  Assessment/Plan:   1. Type 2 diabetes mellitus with other specified complication, without long-term current use of insulin (HCC) We will refill Ozempic 1 mg SubQ once weekly for 1 month with no refills.  -Refill Semaglutide, 1 MG/DOSE, 4 MG/3ML SOPN; Inject 1 mg as directed once a week.  Dispense: 3 mL; Refill: 0  We check labs today.  - Comprehensive metabolic panel - Hemoglobin A1c - Insulin, random  2. Vitamin D deficiency We will refill ergocalciferol 50,000 IU/weekly for 1 month with no refills.  -Refill Vitamin D, Ergocalciferol, (DRISDOL) 1.25 MG (50000 UNIT) CAPS capsule; Take 1 capsule (50,000 Units total) by mouth every 7 (seven) days.  Dispense: 4 capsule; Refill: 0  We will check labs today.  - VITAMIN D 25 Hydroxy (Vit-D  Deficiency, Fractures)  3. Obesity with current BMI 29.9 Bobby Munoz is currently in the action stage of change. As such, his goal is to continue with weight loss efforts. He has agreed to the Category 3 Plan.   Exercise goals: As is.  Behavioral modification strategies: increasing lean protein intake, decreasing simple carbohydrates, meal planning and cooking strategies, keeping healthy foods in the home, and planning for success.  Bobby Munoz has agreed to follow-up with our clinic in 4 weeks. He was informed of the importance of frequent follow-up visits to maximize his success with intensive lifestyle modifications for his multiple health conditions.   Bobby Munoz was informed we would discuss his lab results at his next visit unless there is a critical issue that needs to be addressed sooner. Bobby Munoz agreed to keep his next visit at the agreed upon time to discuss these results.  Objective:   Blood pressure 101/68, pulse 72, temperature (!) 97 F (36.1 C), height 5\' 7"  (1.702 m), weight 190 lb (86.2 kg), SpO2 97 %. Body mass index is 29.76 kg/m.  General: Cooperative, alert, well developed, in no acute distress. HEENT: Conjunctivae and lids unremarkable. Cardiovascular: Regular rhythm.  Lungs: Normal work of breathing. Neurologic: No focal deficits.   Lab Results  Component Value Date   CREATININE 0.77 01/31/2022   BUN 12 01/31/2022   NA 143 01/31/2022   K 4.4 01/31/2022   CL 103 01/31/2022   CO2 22 01/31/2022   Lab Results  Component Value Date   ALT  31 01/31/2022   AST 27 01/31/2022   ALKPHOS 61 01/31/2022   BILITOT 0.4 01/31/2022   Lab Results  Component Value Date   HGBA1C 5.7 (H) 01/31/2022   HGBA1C 5.9 (H) 10/04/2021   HGBA1C 5.7 (H) 06/20/2021   HGBA1C 6.5 (H) 03/07/2021   HGBA1C 6.1 (H) 11/22/2020   Lab Results  Component Value Date   INSULIN 13.8 01/31/2022   INSULIN 13.5 10/04/2021   INSULIN 10.1 06/20/2021   INSULIN 12.9 03/07/2021   INSULIN 16.6 11/22/2020   Lab  Results  Component Value Date   TSH 2.370 11/22/2020   Lab Results  Component Value Date   CHOL 110 10/04/2021   HDL 39 (L) 10/04/2021   LDLCALC 47 10/04/2021   TRIG 136 10/04/2021   CHOLHDL 2.8 10/04/2021   Lab Results  Component Value Date   VD25OH 46.3 01/31/2022   VD25OH 35.8 10/04/2021   VD25OH 71.3 06/20/2021   Lab Results  Component Value Date   WBC 9.8 11/22/2020   HGB 15.3 11/22/2020   HCT 46.6 11/22/2020   MCV 97 11/22/2020   PLT 282 11/22/2020   No results found for: IRON, TIBC, FERRITIN  Attestation Statements:   Reviewed by clinician on day of visit: allergies, medications, problem list, medical history, surgical history, family history, social history, and previous encounter notes.  I, Brendell Tyus, RMA, am acting as transcriptionist for William Hamburger, NP.  I have reviewed the above documentation for accuracy and completeness, and I agree with the above. -  Bobby Cope d. Thatcher Doberstein, NP-C

## 2022-03-06 ENCOUNTER — Encounter (INDEPENDENT_AMBULATORY_CARE_PROVIDER_SITE_OTHER): Payer: Self-pay | Admitting: Adult Health

## 2022-03-06 ENCOUNTER — Other Ambulatory Visit (INDEPENDENT_AMBULATORY_CARE_PROVIDER_SITE_OTHER): Payer: Self-pay | Admitting: Adult Health

## 2022-03-06 ENCOUNTER — Ambulatory Visit (INDEPENDENT_AMBULATORY_CARE_PROVIDER_SITE_OTHER): Payer: Managed Care, Other (non HMO) | Admitting: Adult Health

## 2022-03-06 VITALS — BP 113/75 | HR 67 | Temp 98.3°F | Ht 67.0 in | Wt 187.0 lb

## 2022-03-06 DIAGNOSIS — Z6829 Body mass index (BMI) 29.0-29.9, adult: Secondary | ICD-10-CM

## 2022-03-06 DIAGNOSIS — R0602 Shortness of breath: Secondary | ICD-10-CM

## 2022-03-06 DIAGNOSIS — E66811 Obesity, class 1: Secondary | ICD-10-CM

## 2022-03-06 DIAGNOSIS — E1169 Type 2 diabetes mellitus with other specified complication: Secondary | ICD-10-CM | POA: Diagnosis not present

## 2022-03-06 DIAGNOSIS — E559 Vitamin D deficiency, unspecified: Secondary | ICD-10-CM | POA: Diagnosis not present

## 2022-03-06 DIAGNOSIS — Z7985 Long-term (current) use of injectable non-insulin antidiabetic drugs: Secondary | ICD-10-CM

## 2022-03-06 DIAGNOSIS — Z9189 Other specified personal risk factors, not elsewhere classified: Secondary | ICD-10-CM

## 2022-03-06 DIAGNOSIS — E669 Obesity, unspecified: Secondary | ICD-10-CM | POA: Diagnosis not present

## 2022-03-06 MED ORDER — VITAMIN D (ERGOCALCIFEROL) 1.25 MG (50000 UNIT) PO CAPS: 50000.0000 [IU] | ORAL_CAPSULE | ORAL | 0 refills | Status: DC

## 2022-03-06 MED ORDER — SEMAGLUTIDE (1 MG/DOSE) 4 MG/3ML ~~LOC~~ SOPN: 1.0000 mg | PEN_INJECTOR | SUBCUTANEOUS | 0 refills | Status: DC

## 2022-03-07 ENCOUNTER — Encounter (INDEPENDENT_AMBULATORY_CARE_PROVIDER_SITE_OTHER): Payer: Self-pay | Admitting: Adult Health

## 2022-03-07 NOTE — Progress Notes (Signed)
Chief Complaint:   OBESITY Bobby Munoz is here to discuss his progress with his obesity treatment plan along with follow-up of his obesity related diagnoses. Bobby Munoz is on the Category 3 Plan and states he is following his eating plan approximately 70% of the time. Bobby Munoz states he is walking for 60 minutes 5 times per week.  Today's visit was #: 18 Starting weight: 206 lbs Starting date: 11/22/2020 Today's weight: 187 lbs Today's date: 03/06/22 Total lbs lost to date: 19 Total lbs lost since last in-office visit: -3  Interim History:  11/22/2020 RMR 2364-started on category 3 meal plan RMR re-checked today and metabolism is 1368.  Subjective:   1. Shortness of breath on exertion He endorses dyspnea with extreme exertion, denies CP with exertion. 11/22/2020 RMR 2364 RMR today 1368. Metabolic rate decreased by 996 calories.  2. Type 2 diabetes mellitus with other specified complication, without long-term current use of insulin (HCC) 10/30/2021- Rybelsus 7 mg replaced with Ozempic 0.5 mg 01/02/2022 Ozempic increased from 0.5 mg to 1 mg. He experienced profuse diarrhea on May 29 and 2 June.   He held Ozempic 6/13 dose.   Ozempic 1 mg restarted  on 03/05/2022. He denies any GI upset at present. 01/31/2022 A1c 5.7 at goal!   Discussed labs with patient today.  3. Vitamin D deficiency 01/31/2022 vitamin D level-46.3-stable. He is on ergocalciferol-denies nausea/vomiting/muscle weakness. Discussed labs with patient today.  4. At risk for diarrhea Related to recent GI upset.  Assessment/Plan:   1. Shortness of breath on exertion Decrease meal plan from category 3 to category 2 to account for decreased metabolism. Check IC.  2. Type 2 diabetes mellitus with other specified complication, without long-term current use of insulin (HCC) Refill - Semaglutide, 1 MG/DOSE, 4 MG/3ML SOPN; Inject 1 mg as directed once a week.  Dispense: 3 mL; Refill: 0  3. Vitamin D deficiency Refill - Vitamin  D, Ergocalciferol, (DRISDOL) 1.25 MG (50000 UNIT) CAPS capsule; Take 1 capsule (50,000 Units total) by mouth every 7 (seven) days.  Dispense: 4 capsule; Refill: 0  4. At risk for diarrhea Bobby Munoz was given approximately 15 minutes of diarrhea prevention counseling today. He is 66 y.o. male and has risk factors for diarrhea including medications and changes in diet. We discussed intensive lifestyle modifications today with an emphasis on specific weight loss instructions including dietary strategies.   Repetitive spaced learning was employed today to elicit superior memory formation and behavioral change.   5. Obesity with current BMI 29.3 1. Decrease from category 3 to category 2 to account reduction of RMR 2.  Contact [facility] with any GI upset sx's.  Bobby Munoz is currently in the action stage of change. As such, his goal is to continue with weight loss efforts. He has agreed to the Category 3 Plan.   Exercise goals: as is  Behavioral modification strategies: increasing lean protein intake, decreasing simple carbohydrates, meal planning and cooking strategies, keeping healthy foods in the home, and planning for success.  Bobby Munoz has agreed to follow-up with our clinic in 4 weeks. He was informed of the importance of frequent follow-up visits to maximize his success with intensive lifestyle modifications for his multiple health conditions.    Objective:   Blood pressure 113/75, pulse 67, temperature 98.3 F (36.8 C), height 5\' 7"  (1.702 m), weight 187 lb (84.8 kg), SpO2 97 %. Body mass index is 29.29 kg/m.  General: Cooperative, alert, well developed, in no acute distress. HEENT: Conjunctivae and lids unremarkable.  Cardiovascular: Regular rhythm.  Lungs: Normal work of breathing. Neurologic: No focal deficits.   Lab Results  Component Value Date   CREATININE 0.77 01/31/2022   BUN 12 01/31/2022   NA 143 01/31/2022   K 4.4 01/31/2022   CL 103 01/31/2022   CO2 22 01/31/2022   Lab Results   Component Value Date   ALT 31 01/31/2022   AST 27 01/31/2022   ALKPHOS 61 01/31/2022   BILITOT 0.4 01/31/2022   Lab Results  Component Value Date   HGBA1C 5.7 (H) 01/31/2022   HGBA1C 5.9 (H) 10/04/2021   HGBA1C 5.7 (H) 06/20/2021   HGBA1C 6.5 (H) 03/07/2021   HGBA1C 6.1 (H) 11/22/2020   Lab Results  Component Value Date   INSULIN 13.8 01/31/2022   INSULIN 13.5 10/04/2021   INSULIN 10.1 06/20/2021   INSULIN 12.9 03/07/2021   INSULIN 16.6 11/22/2020   Lab Results  Component Value Date   TSH 2.370 11/22/2020   Lab Results  Component Value Date   CHOL 110 10/04/2021   HDL 39 (L) 10/04/2021   LDLCALC 47 10/04/2021   TRIG 136 10/04/2021   CHOLHDL 2.8 10/04/2021   Lab Results  Component Value Date   VD25OH 46.3 01/31/2022   VD25OH 35.8 10/04/2021   VD25OH 71.3 06/20/2021   Lab Results  Component Value Date   WBC 9.8 11/22/2020   HGB 15.3 11/22/2020   HCT 46.6 11/22/2020   MCV 97 11/22/2020   PLT 282 11/22/2020   No results found for: "IRON", "TIBC", "FERRITIN"   Attestation Statements:   Reviewed by clinician on day of visit: allergies, medications, problem list, medical history, surgical history, family history, social history, and previous encounter notes.   I, Dawn Whitmire, FNP-C, am acting as Energy manager for [employer]. Vaishnav Demartin, NP-C.  I have reviewed the above documentation for accuracy and completeness, and I agree with the above. -  Baker Kogler d. Brandy Zuba, NP-C

## 2022-04-08 ENCOUNTER — Ambulatory Visit (INDEPENDENT_AMBULATORY_CARE_PROVIDER_SITE_OTHER): Payer: Managed Care, Other (non HMO) | Admitting: Adult Health

## 2022-04-08 ENCOUNTER — Encounter (INDEPENDENT_AMBULATORY_CARE_PROVIDER_SITE_OTHER): Payer: Self-pay | Admitting: Adult Health

## 2022-04-08 VITALS — BP 119/74 | HR 70 | Temp 97.9°F | Ht 67.0 in | Wt 185.0 lb

## 2022-04-08 DIAGNOSIS — Z9189 Other specified personal risk factors, not elsewhere classified: Secondary | ICD-10-CM

## 2022-04-08 DIAGNOSIS — E559 Vitamin D deficiency, unspecified: Secondary | ICD-10-CM

## 2022-04-08 DIAGNOSIS — E669 Obesity, unspecified: Secondary | ICD-10-CM | POA: Diagnosis not present

## 2022-04-08 DIAGNOSIS — Z7984 Long term (current) use of oral hypoglycemic drugs: Secondary | ICD-10-CM

## 2022-04-08 DIAGNOSIS — E1169 Type 2 diabetes mellitus with other specified complication: Secondary | ICD-10-CM | POA: Diagnosis not present

## 2022-04-08 DIAGNOSIS — Z6829 Body mass index (BMI) 29.0-29.9, adult: Secondary | ICD-10-CM | POA: Diagnosis not present

## 2022-04-08 DIAGNOSIS — Z7985 Long-term (current) use of injectable non-insulin antidiabetic drugs: Secondary | ICD-10-CM

## 2022-04-08 MED ORDER — SEMAGLUTIDE (1 MG/DOSE) 4 MG/3ML ~~LOC~~ SOPN: 1.0000 mg | PEN_INJECTOR | SUBCUTANEOUS | 0 refills | Status: DC

## 2022-04-08 MED ORDER — VITAMIN D (ERGOCALCIFEROL) 1.25 MG (50000 UNIT) PO CAPS: 50000.0000 [IU] | ORAL_CAPSULE | ORAL | 0 refills | Status: DC

## 2022-04-10 NOTE — Progress Notes (Unsigned)
Chief Complaint:   OBESITY Bobby Munoz is here to discuss his progress with his obesity treatment plan along with follow-up of his obesity related diagnoses. Bobby Munoz is on {MWMwtlossportion/plan2:23431} and states he is following his eating plan approximately ***% of the time. Bobby Munoz states he is *** *** minutes *** times per week.  Today's visit was #: *** Starting weight: *** Starting date: *** Today's weight: *** Today's date: 04/08/2022 Total lbs lost to date: *** Total lbs lost since last in-office visit: ***  Interim History: ***  Subjective:   1. Type 2 diabetes mellitus with other specified complication, without long-term current use of insulin (HCC) ***  2. Vitamin D deficiency ***  3. At risk for activity intolerance ***  Assessment/Plan:   1. Type 2 diabetes mellitus with other specified complication, without long-term current use of insulin (HCC) *** - Semaglutide, 1 MG/DOSE, 4 MG/3ML SOPN; Inject 1 mg as directed once a week.  Dispense: 3 mL; Refill: 0  2. Vitamin D deficiency *** - Vitamin D, Ergocalciferol, (DRISDOL) 1.25 MG (50000 UNIT) CAPS capsule; Take 1 capsule (50,000 Units total) by mouth every 7 (seven) days.  Dispense: 4 capsule; Refill: 0  3. At risk for activity intolerance ***  4. Obesity, current BMI 29.1 Bobby Munoz {CHL AMB IS/IS NOT:210130109} currently in the action stage of change. As such, his goal is to {MWMwtloss#1:210800005}. He has agreed to {MWMwtlossportion/plan2:23431}.   Exercise goals: {MWM EXERCISE RECS:23473}  Behavioral modification strategies: {MWMwtlossdietstrategies3:23432}.  Bobby Munoz has agreed to follow-up with our clinic in {NUMBER 1-10:22536} weeks. He was informed of the importance of frequent follow-up visits to maximize his success with intensive lifestyle modifications for his multiple health conditions.   Objective:   Blood pressure 119/74, pulse 70, temperature 97.9 F (36.6 C), height 5\' 7"  (1.702 m), weight 185 lb  (83.9 kg), SpO2 96 %. Body mass index is 28.98 kg/m.  General: Cooperative, alert, well developed, in no acute distress. HEENT: Conjunctivae and lids unremarkable. Cardiovascular: Regular rhythm.  Lungs: Normal work of breathing. Neurologic: No focal deficits.   Lab Results  Component Value Date   CREATININE 0.77 01/31/2022   BUN 12 01/31/2022   NA 143 01/31/2022   K 4.4 01/31/2022   CL 103 01/31/2022   CO2 22 01/31/2022   Lab Results  Component Value Date   ALT 31 01/31/2022   AST 27 01/31/2022   ALKPHOS 61 01/31/2022   BILITOT 0.4 01/31/2022   Lab Results  Component Value Date   HGBA1C 5.7 (H) 01/31/2022   HGBA1C 5.9 (H) 10/04/2021   HGBA1C 5.7 (H) 06/20/2021   HGBA1C 6.5 (H) 03/07/2021   HGBA1C 6.1 (H) 11/22/2020   Lab Results  Component Value Date   INSULIN 13.8 01/31/2022   INSULIN 13.5 10/04/2021   INSULIN 10.1 06/20/2021   INSULIN 12.9 03/07/2021   INSULIN 16.6 11/22/2020   Lab Results  Component Value Date   TSH 2.370 11/22/2020   Lab Results  Component Value Date   CHOL 110 10/04/2021   HDL 39 (L) 10/04/2021   LDLCALC 47 10/04/2021   TRIG 136 10/04/2021   CHOLHDL 2.8 10/04/2021   Lab Results  Component Value Date   VD25OH 46.3 01/31/2022   VD25OH 35.8 10/04/2021   VD25OH 71.3 06/20/2021   Lab Results  Component Value Date   WBC 9.8 11/22/2020   HGB 15.3 11/22/2020   HCT 46.6 11/22/2020   MCV 97 11/22/2020   PLT 282 11/22/2020   No results found for: "IRON", "  TIBC", "FERRITIN"  Attestation Statements:   Reviewed by clinician on day of visit: allergies, medications, problem list, medical history, surgical history, family history, social history, and previous encounter notes.  I, ***, am acting as transcriptionist for ***.  I have reviewed the above documentation for accuracy and completeness, and I agree with the above. -  ***

## 2022-04-24 ENCOUNTER — Encounter (INDEPENDENT_AMBULATORY_CARE_PROVIDER_SITE_OTHER): Payer: Self-pay

## 2022-05-02 ENCOUNTER — Other Ambulatory Visit (INDEPENDENT_AMBULATORY_CARE_PROVIDER_SITE_OTHER): Payer: Self-pay | Admitting: Adult Health

## 2022-05-02 DIAGNOSIS — E559 Vitamin D deficiency, unspecified: Secondary | ICD-10-CM

## 2022-05-06 ENCOUNTER — Ambulatory Visit (INDEPENDENT_AMBULATORY_CARE_PROVIDER_SITE_OTHER): Payer: Managed Care, Other (non HMO) | Admitting: Adult Health

## 2022-05-06 ENCOUNTER — Encounter (INDEPENDENT_AMBULATORY_CARE_PROVIDER_SITE_OTHER): Payer: Self-pay | Admitting: Adult Health

## 2022-05-06 VITALS — BP 118/77 | HR 58 | Temp 97.7°F | Ht 67.0 in | Wt 186.0 lb

## 2022-05-06 DIAGNOSIS — Z6829 Body mass index (BMI) 29.0-29.9, adult: Secondary | ICD-10-CM

## 2022-05-06 DIAGNOSIS — I152 Hypertension secondary to endocrine disorders: Secondary | ICD-10-CM | POA: Diagnosis not present

## 2022-05-06 DIAGNOSIS — E559 Vitamin D deficiency, unspecified: Secondary | ICD-10-CM | POA: Diagnosis not present

## 2022-05-06 DIAGNOSIS — E669 Obesity, unspecified: Secondary | ICD-10-CM

## 2022-05-06 DIAGNOSIS — E1169 Type 2 diabetes mellitus with other specified complication: Secondary | ICD-10-CM | POA: Diagnosis not present

## 2022-05-06 DIAGNOSIS — E1159 Type 2 diabetes mellitus with other circulatory complications: Secondary | ICD-10-CM | POA: Diagnosis not present

## 2022-05-06 DIAGNOSIS — Z7984 Long term (current) use of oral hypoglycemic drugs: Secondary | ICD-10-CM

## 2022-05-06 DIAGNOSIS — Z7985 Long-term (current) use of injectable non-insulin antidiabetic drugs: Secondary | ICD-10-CM

## 2022-05-06 MED ORDER — SEMAGLUTIDE (1 MG/DOSE) 4 MG/3ML ~~LOC~~ SOPN: 1.0000 mg | PEN_INJECTOR | SUBCUTANEOUS | 0 refills | Status: DC

## 2022-05-06 MED ORDER — VITAMIN D (ERGOCALCIFEROL) 1.25 MG (50000 UNIT) PO CAPS: 50000.0000 [IU] | ORAL_CAPSULE | ORAL | 0 refills | Status: DC

## 2022-05-10 NOTE — Progress Notes (Unsigned)
Chief Complaint:   OBESITY Bobby Munoz is here to discuss his progress with his obesity treatment plan along with follow-up of his obesity related diagnoses. Tavonte is on the Category 2 Plan and states he is following his eating plan approximately 70% of the time. Liston states he is walking 60 minutes 7 times per week.  Today's visit was #: 20 Starting weight: 206 lbs Starting date: 11/22/2020 Today's weight: 186 lbs Today's date: 05/06/2022 Total lbs lost to date: 20 lbs Total lbs lost since last in-office visit: +1 lb  Interim History: He is walking mostly indoor on home treadmill.  Will slightly relieve chronic back pain.  Subjective:   1. Hypertension associated with type 2 diabetes mellitus (HCC) Blood pressure was excellent at office visit.   2. Type 2 diabetes mellitus with other specified complication, without long-term current use of insulin (HCC) He reports decrease in cravings with change of therapy from Rybelsus to Ozempic.  Currently on Ozempic 1 mg ***.   3. Vitamin D deficiency He is on once weekly ergocalciferol.   Assessment/Plan:   1. Hypertension associated with type 2 diabetes mellitus (HCC) Continue ***  2. Type 2 diabetes mellitus with other specified complication, without long-term current use of insulin (HCC) Refill - Semaglutide, 1 MG/DOSE, 4 MG/3ML SOPN; Inject 1 mg as directed once a week.  Dispense: 3 mL; Refill: 0  3. Vitamin D deficiency Refill - Vitamin D, Ergocalciferol, (DRISDOL) 1.25 MG (50000 UNIT) CAPS capsule; Take 1 capsule (50,000 Units total) by mouth every 7 (seven) days.  Dispense: 4 capsule; Refill: 0  4. Obesity with current BMI 29.3 Sang is currently in the action stage of change. As such, his goal is to continue with weight loss efforts. He has agreed to the Category 3 Plan.   Exercise goals:  As is.   Behavioral modification strategies: increasing lean protein intake, decreasing simple carbohydrates, meal planning and cooking  strategies, keeping healthy foods in the home, and planning for success.  Jadiel has agreed to follow-up with our clinic in 4 weeks. He was informed of the importance of frequent follow-up visits to maximize his success with intensive lifestyle modifications for his multiple health conditions.   Objective:   Blood pressure 118/77, pulse (!) 58, temperature 97.7 F (36.5 C), height 5\' 7"  (1.702 m), weight 186 lb (84.4 kg), SpO2 98 %. Body mass index is 29.13 kg/m.  General: Cooperative, alert, well developed, in no acute distress. HEENT: Conjunctivae and lids unremarkable. Cardiovascular: Regular rhythm.  Lungs: Normal work of breathing. Neurologic: No focal deficits.   Lab Results  Component Value Date   CREATININE 0.77 01/31/2022   BUN 12 01/31/2022   NA 143 01/31/2022   K 4.4 01/31/2022   CL 103 01/31/2022   CO2 22 01/31/2022   Lab Results  Component Value Date   ALT 31 01/31/2022   AST 27 01/31/2022   ALKPHOS 61 01/31/2022   BILITOT 0.4 01/31/2022   Lab Results  Component Value Date   HGBA1C 5.7 (H) 01/31/2022   HGBA1C 5.9 (H) 10/04/2021   HGBA1C 5.7 (H) 06/20/2021   HGBA1C 6.5 (H) 03/07/2021   HGBA1C 6.1 (H) 11/22/2020   Lab Results  Component Value Date   INSULIN 13.8 01/31/2022   INSULIN 13.5 10/04/2021   INSULIN 10.1 06/20/2021   INSULIN 12.9 03/07/2021   INSULIN 16.6 11/22/2020   Lab Results  Component Value Date   TSH 2.370 11/22/2020   Lab Results  Component Value Date  CHOL 110 10/04/2021   HDL 39 (L) 10/04/2021   LDLCALC 47 10/04/2021   TRIG 136 10/04/2021   CHOLHDL 2.8 10/04/2021   Lab Results  Component Value Date   VD25OH 46.3 01/31/2022   VD25OH 35.8 10/04/2021   VD25OH 71.3 06/20/2021   Lab Results  Component Value Date   WBC 9.8 11/22/2020   HGB 15.3 11/22/2020   HCT 46.6 11/22/2020   MCV 97 11/22/2020   PLT 282 11/22/2020   No results found for: "IRON", "TIBC", "FERRITIN"  Attestation Statements:   Reviewed by clinician  on day of visit: allergies, medications, problem list, medical history, surgical history, family history, social history, and previous encounter notes.  I, Malcolm Metro, RMA, am acting as Energy manager for William Hamburger, NP.  I have reviewed the above documentation for accuracy and completeness, and I agree with the above. -  ***

## 2022-06-01 ENCOUNTER — Other Ambulatory Visit: Payer: Self-pay | Admitting: Interventional Cardiology

## 2022-06-03 ENCOUNTER — Other Ambulatory Visit: Payer: Self-pay | Admitting: Interventional Cardiology

## 2022-06-04 ENCOUNTER — Ambulatory Visit (INDEPENDENT_AMBULATORY_CARE_PROVIDER_SITE_OTHER): Payer: Managed Care, Other (non HMO) | Admitting: Adult Health

## 2022-06-04 ENCOUNTER — Encounter (INDEPENDENT_AMBULATORY_CARE_PROVIDER_SITE_OTHER): Payer: Self-pay | Admitting: Adult Health

## 2022-06-04 VITALS — BP 96/66 | HR 63 | Temp 97.6°F | Ht 67.0 in | Wt 184.0 lb

## 2022-06-04 DIAGNOSIS — I152 Hypertension secondary to endocrine disorders: Secondary | ICD-10-CM | POA: Diagnosis not present

## 2022-06-04 DIAGNOSIS — Z7985 Long-term (current) use of injectable non-insulin antidiabetic drugs: Secondary | ICD-10-CM

## 2022-06-04 DIAGNOSIS — E1169 Type 2 diabetes mellitus with other specified complication: Secondary | ICD-10-CM | POA: Diagnosis not present

## 2022-06-04 DIAGNOSIS — E559 Vitamin D deficiency, unspecified: Secondary | ICD-10-CM | POA: Diagnosis not present

## 2022-06-04 DIAGNOSIS — Z6828 Body mass index (BMI) 28.0-28.9, adult: Secondary | ICD-10-CM

## 2022-06-04 DIAGNOSIS — E669 Obesity, unspecified: Secondary | ICD-10-CM

## 2022-06-04 DIAGNOSIS — E1159 Type 2 diabetes mellitus with other circulatory complications: Secondary | ICD-10-CM | POA: Diagnosis not present

## 2022-06-04 MED ORDER — VITAMIN D (ERGOCALCIFEROL) 1.25 MG (50000 UNIT) PO CAPS: 50000.0000 [IU] | ORAL_CAPSULE | ORAL | 0 refills | Status: DC

## 2022-06-04 MED ORDER — SEMAGLUTIDE (1 MG/DOSE) 4 MG/3ML ~~LOC~~ SOPN: 1.0000 mg | PEN_INJECTOR | SUBCUTANEOUS | 0 refills | Status: DC

## 2022-06-05 DIAGNOSIS — I152 Hypertension secondary to endocrine disorders: Secondary | ICD-10-CM | POA: Insufficient documentation

## 2022-06-05 NOTE — Progress Notes (Signed)
Chief Complaint:   OBESITY Bobby Munoz is here to discuss his progress with his obesity treatment plan along with follow-up of his obesity related diagnoses. Bobby Munoz is on the Category 3 Plan and states he is following his eating plan approximately 70% of the time. Bobby Munoz states he is walking 60 minutes 5 times per week.  Today's visit was #: 21 Starting weight: 206 lbs Starting date: 11/22/2020 Today's weight: 184 lbs Today's date: 06/04/2022 Total lbs lost to date: 22 lbs Total lbs lost since last in-office visit: 2 lbs  Interim History:  Bobby Munoz will enjoy several power bars daily, unsure of calories/15 grams of protein. Bobby Munoz is looking forward to his retirement this December.   Subjective:   1. Type 2 diabetes mellitus with other specified complication, without long-term current use of insulin ([place]) Rybelsus replaced with Ozempic- currently on 1mg  once weekly injection. He denies mass in neck, dysphagia, dyspepsia, persistent hoarseness, abdominal pain, or N/V/Constipation.  2. Vitamin D deficiency 01/31/2022, Vitamin D level 46.3- stable.  3. Hypertension associated with type 2 diabetes mellitus ([place]) Home readings SBP, 120's, DBP 80's.   Will obtain Fasting labs today.  He reports feels dehydrated at present. BP soft at OV. He denies sx's of hypotension.  Assessment/Plan:   1. Type 2 diabetes mellitus with other specified complication, without long-term current use of insulin (HCC) Refill - Semaglutide, 1 MG/DOSE, 4 MG/3ML SOPN; Inject 1 mg as directed once a week.  Dispense: 3 mL; Refill: 0  Check labs  - Hemoglobin A1c - Insulin, random  2. Vitamin D deficiency Refill - Vitamin D, Ergocalciferol, (DRISDOL) 1.25 MG (50000 UNIT) CAPS capsule; Take 1 capsule (50,000 Units total) by mouth every 7 (seven) days.  Dispense: 4 capsule; Refill: 0  Check labs   - VITAMIN D 25 Hydroxy (Vit-D Deficiency, Fractures)  3. Hypertension associated with type 2 diabetes mellitus  (HCC) Increase water intake.   Check labs  - Comprehensive metabolic panel  4. Obesity with current BMI 28.9 Bobby Munoz is currently in the action stage of change. As such, his goal is to continue with weight loss efforts. He has agreed to the Category 3 Plan.   Exercise goals:  as is.   Behavioral modification strategies: increasing lean protein intake, decreasing simple carbohydrates, meal planning and cooking strategies, keeping healthy foods in the home, and planning for success.  Bobby Munoz has agreed to follow-up with our clinic in 4 weeks. He was informed of the importance of frequent follow-up visits to maximize his success with intensive lifestyle modifications for his multiple health conditions.   Bobby Munoz was informed we would discuss his lab results at his next visit unless there is a critical issue that needs to be addressed sooner. Bobby Munoz agreed to keep his next visit at the agreed upon time to discuss these results.  Objective:   Blood pressure 96/66, pulse 63, temperature 97.6 F (36.4 C), height 5\' 7"  (1.702 m), weight 184 lb (83.5 kg), SpO2 97 %. Body mass index is 28.82 kg/m.  General: Cooperative, alert, well developed, in no acute distress. HEENT: Conjunctivae and lids unremarkable. Cardiovascular: Regular rhythm.  Lungs: Normal work of breathing. Neurologic: No focal deficits.   Lab Results  Component Value Date   CREATININE 0.76 06/04/2022   BUN 16 06/04/2022   NA 142 06/04/2022   K 4.5 06/04/2022   CL 104 06/04/2022   CO2 22 06/04/2022   Lab Results  Component Value Date   ALT 14 06/04/2022   AST  18 06/04/2022   ALKPHOS 48 06/04/2022   BILITOT 0.3 06/04/2022   Lab Results  Component Value Date   HGBA1C 5.8 (H) 06/04/2022   HGBA1C 5.7 (H) 01/31/2022   HGBA1C 5.9 (H) 10/04/2021   HGBA1C 5.7 (H) 06/20/2021   HGBA1C 6.5 (H) 03/07/2021   Lab Results  Component Value Date   INSULIN WILL FOLLOW 06/04/2022   INSULIN 13.8 01/31/2022   INSULIN 13.5  10/04/2021   INSULIN 10.1 06/20/2021   INSULIN 12.9 03/07/2021   Lab Results  Component Value Date   TSH 2.370 11/22/2020   Lab Results  Component Value Date   CHOL 110 10/04/2021   HDL 39 (L) 10/04/2021   LDLCALC 47 10/04/2021   TRIG 136 10/04/2021   CHOLHDL 2.8 10/04/2021   Lab Results  Component Value Date   VD25OH 64.4 06/04/2022   VD25OH 46.3 01/31/2022   VD25OH 35.8 10/04/2021   Lab Results  Component Value Date   WBC 9.8 11/22/2020   HGB 15.3 11/22/2020   HCT 46.6 11/22/2020   MCV 97 11/22/2020   PLT 282 11/22/2020   No results found for: "IRON", "TIBC", "FERRITIN"  Obesity Behavioral Intervention:   Approximately 15 minutes were spent on the discussion below.  ASK: We discussed the diagnosis of obesity with Bobby Munoz today and Bobby Munoz agreed to give [place] permission to discuss obesity behavioral modification therapy today.  ASSESS: Bobby Munoz has the diagnosis of obesity and his BMI today is 28.9. Bobby Munoz is in the action stage of change.   ADVISE: Bobby Munoz was educated on the multiple health risks of obesity as well as the benefit of weight loss to improve his health. He was advised of the need for long term treatment and the importance of lifestyle modifications to improve his current health and to decrease his risk of future health problems.  AGREE: Multiple dietary modification options and treatment options were discussed and [name] agreed to follow the recommendations documented in the above note.  ARRANGE: Bobby Munoz was educated on the importance of frequent visits to treat obesity as outlined per CMS and USPSTF guidelines and agreed to schedule his next follow up appointment today.  Attestation Statements:   Reviewed by clinician on day of visit: allergies, medications, problem list, medical history, surgical history, family history, social history, and previous encounter notes.  I, Bobby Munoz, RMA, am acting as Energy manager for Bobby Hamburger, NP.   I have  reviewed the above documentation for accuracy and completeness, and I agree with the above. -  Bobby Vohs d. Otilio Groleau, NP-C

## 2022-06-06 LAB — COMPREHENSIVE METABOLIC PANEL
ALT: 14 IU/L (ref 0–44)
AST: 18 IU/L (ref 0–40)
Albumin/Globulin Ratio: 1.6 (ref 1.2–2.2)
Albumin: 4.2 g/dL (ref 3.9–4.9)
Alkaline Phosphatase: 48 IU/L (ref 44–121)
BUN/Creatinine Ratio: 21 (ref 10–24)
BUN: 16 mg/dL (ref 8–27)
Bilirubin Total: 0.3 mg/dL (ref 0.0–1.2)
CO2: 22 mmol/L (ref 20–29)
Calcium: 9.3 mg/dL (ref 8.6–10.2)
Chloride: 104 mmol/L (ref 96–106)
Creatinine, Ser: 0.76 mg/dL (ref 0.76–1.27)
Globulin, Total: 2.7 g/dL (ref 1.5–4.5)
Glucose: 99 mg/dL (ref 70–99)
Potassium: 4.5 mmol/L (ref 3.5–5.2)
Sodium: 142 mmol/L (ref 134–144)
Total Protein: 6.9 g/dL (ref 6.0–8.5)
eGFR: 99 mL/min/{1.73_m2} (ref >59)

## 2022-06-06 LAB — HEMOGLOBIN A1C
Est. average glucose Bld gHb Est-mCnc: 120 mg/dL
Hgb A1c MFr Bld: 5.8 % — ABNORMAL HIGH (ref 4.8–5.6)

## 2022-06-06 LAB — INSULIN, RANDOM: INSULIN: 14.5 u[IU]/mL (ref 2.6–24.9)

## 2022-06-06 LAB — VITAMIN D 25 HYDROXY (VIT D DEFICIENCY, FRACTURES): Vit D, 25-Hydroxy: 64.4 ng/mL (ref 30.0–100.0)

## 2022-06-26 ENCOUNTER — Other Ambulatory Visit (INDEPENDENT_AMBULATORY_CARE_PROVIDER_SITE_OTHER): Payer: Self-pay | Admitting: Adult Health

## 2022-06-26 DIAGNOSIS — E559 Vitamin D deficiency, unspecified: Secondary | ICD-10-CM

## 2022-07-02 ENCOUNTER — Other Ambulatory Visit: Payer: Self-pay | Admitting: Interventional Cardiology

## 2022-07-02 ENCOUNTER — Encounter (INDEPENDENT_AMBULATORY_CARE_PROVIDER_SITE_OTHER): Payer: Self-pay | Admitting: Adult Health

## 2022-07-02 ENCOUNTER — Ambulatory Visit (INDEPENDENT_AMBULATORY_CARE_PROVIDER_SITE_OTHER): Payer: Managed Care, Other (non HMO) | Admitting: Adult Health

## 2022-07-02 VITALS — BP 114/75 | HR 63 | Temp 97.7°F | Ht 67.0 in | Wt 180.0 lb

## 2022-07-02 DIAGNOSIS — Z6828 Body mass index (BMI) 28.0-28.9, adult: Secondary | ICD-10-CM

## 2022-07-02 DIAGNOSIS — Z7985 Long-term (current) use of injectable non-insulin antidiabetic drugs: Secondary | ICD-10-CM

## 2022-07-02 DIAGNOSIS — E669 Obesity, unspecified: Secondary | ICD-10-CM

## 2022-07-02 DIAGNOSIS — I152 Hypertension secondary to endocrine disorders: Secondary | ICD-10-CM

## 2022-07-02 DIAGNOSIS — E1169 Type 2 diabetes mellitus with other specified complication: Secondary | ICD-10-CM

## 2022-07-02 DIAGNOSIS — E559 Vitamin D deficiency, unspecified: Secondary | ICD-10-CM | POA: Diagnosis not present

## 2022-07-02 DIAGNOSIS — E1159 Type 2 diabetes mellitus with other circulatory complications: Secondary | ICD-10-CM | POA: Diagnosis not present

## 2022-07-02 MED ORDER — SEMAGLUTIDE (1 MG/DOSE) 4 MG/3ML ~~LOC~~ SOPN: 1.0000 mg | PEN_INJECTOR | SUBCUTANEOUS | 0 refills | Status: DC

## 2022-07-02 MED ORDER — VITAMIN D (ERGOCALCIFEROL) 1.25 MG (50000 UNIT) PO CAPS: 50000.0000 [IU] | ORAL_CAPSULE | ORAL | 0 refills | Status: DC

## 2022-07-09 NOTE — Progress Notes (Unsigned)
Chief Complaint:   OBESITY Bobby Munoz is here to discuss his progress with his obesity treatment plan along with follow-up of his obesity related diagnoses. Bobby Munoz is on {MWMwtlossportion/plan2:23431} and states he is following his eating plan approximately ***% of the time. Bobby Munoz states he is *** *** minutes *** times per week.  Today's visit was #: *** Starting weight: *** Starting date: *** Today's weight: *** Today's date: 07/09/2022 Total lbs lost to date: *** Total lbs lost since last in-office visit: ***  Interim History: ***  Subjective:   1. Hypertension associated with type 2 diabetes mellitus (HCC) ***  2. Vitamin D deficiency ***  3. Type 2 diabetes mellitus with other specified complication, without long-term current use of insulin (HCC) ***  4. Obesity with current BMI 28.3 ***   Assessment/Plan:   1. Hypertension associated with type 2 diabetes mellitus (HCC) ***  2. Vitamin D deficiency *** - Vitamin D, Ergocalciferol, (DRISDOL) 1.25 MG (50000 UNIT) CAPS capsule; Take 1 capsule (50,000 Units total) by mouth every 14 (fourteen) days.  Dispense: 4 capsule; Refill: 0  3. Type 2 diabetes mellitus with other specified complication, without long-term current use of insulin (HCC) *** - Semaglutide, 1 MG/DOSE, 4 MG/3ML SOPN; Inject 1 mg as directed once a week.  Dispense: 3 mL; Refill: 0  4. Obesity with current BMI 28.3 ***  Bobby Munoz {CHL AMB IS/IS NOT:210130109} currently in the action stage of change. As such, his goal is to {MWMwtloss#1:210800005}. He has agreed to {MWMwtlossportion/plan2:23431}.   Exercise goals: {MWM EXERCISE RECS:23473}  Behavioral modification strategies: {MWMwtlossdietstrategies3:23432}.  Bobby Munoz has agreed to follow-up with our clinic in {NUMBER 1-10:22536} weeks. He was informed of the importance of frequent follow-up visits to maximize his success with intensive lifestyle modifications for his multiple health conditions.    ***delete paragraph if no labs orderedJames was informed we would discuss his lab results at his next visit unless there is a critical issue that needs to be addressed sooner. Bobby Munoz agreed to keep his next visit at the agreed upon time to discuss these results.  Objective:   Blood pressure 114/75, pulse 63, temperature 97.7 F (36.5 C), height 5\' 7"  (1.702 m), weight 180 lb (81.6 kg), SpO2 91 %. Body mass index is 28.19 kg/m.  General: Cooperative, alert, well developed, in no acute distress. HEENT: Conjunctivae and lids unremarkable. Cardiovascular: Regular rhythm.  Lungs: Normal work of breathing. Neurologic: No focal deficits.   Lab Results  Component Value Date   CREATININE 0.76 06/04/2022   BUN 16 06/04/2022   NA 142 06/04/2022   K 4.5 06/04/2022   CL 104 06/04/2022   CO2 22 06/04/2022   Lab Results  Component Value Date   ALT 14 06/04/2022   AST 18 06/04/2022   ALKPHOS 48 06/04/2022   BILITOT 0.3 06/04/2022   Lab Results  Component Value Date   HGBA1C 5.8 (H) 06/04/2022   HGBA1C 5.7 (H) 01/31/2022   HGBA1C 5.9 (H) 10/04/2021   HGBA1C 5.7 (H) 06/20/2021   HGBA1C 6.5 (H) 03/07/2021   Lab Results  Component Value Date   INSULIN 14.5 06/04/2022   INSULIN 13.8 01/31/2022   INSULIN 13.5 10/04/2021   INSULIN 10.1 06/20/2021   INSULIN 12.9 03/07/2021   Lab Results  Component Value Date   TSH 2.370 11/22/2020   Lab Results  Component Value Date   CHOL 110 10/04/2021   HDL 39 (L) 10/04/2021   LDLCALC 47 10/04/2021   TRIG 136 10/04/2021   CHOLHDL  2.8 10/04/2021   Lab Results  Component Value Date   VD25OH 64.4 06/04/2022   VD25OH 46.3 01/31/2022   VD25OH 35.8 10/04/2021   Lab Results  Component Value Date   WBC 9.8 11/22/2020   HGB 15.3 11/22/2020   HCT 46.6 11/22/2020   MCV 97 11/22/2020   PLT 282 11/22/2020   No results found for: "IRON", "TIBC", "FERRITIN"  Obesity Behavioral Intervention:   Approximately 15 minutes were spent on the  discussion below.  ASK: We discussed the diagnosis of obesity with Bobby Munoz today and Bobby Munoz agreed to give [place] permission to discuss obesity behavioral modification therapy today.  ASSESS: Bobby Munoz has the diagnosis of obesity and his BMI today is ***. [place] {ACTION; IS/IS TMH:[id number]} in the action stage of change.   ADVISE: Bobby Munoz was educated on the multiple health risks of obesity as well as the benefit of weight loss to improve his health. He was advised of the need for long term treatment and the importance of lifestyle modifications to improve his current health and to decrease his risk of future health problems.  AGREE: Multiple dietary modification options and treatment options were discussed and Bobby Munoz agreed to follow the recommendations documented in the above note.  ARRANGE: Bobby Munoz was educated on the importance of frequent visits to treat obesity as outlined per CMS and USPSTF guidelines and agreed to schedule his next follow up appointment today.  Attestation Statements:   Reviewed by clinician on day of visit: allergies, medications, problem list, medical history, surgical history, family history, social history, and previous encounter notes.  ***(delete if time-based billing not used)Time spent on visit including pre-visit chart review and post-visit care and charting was *** minutes.   I, ***, am acting as transcriptionist for ***.  I have reviewed the above documentation for accuracy and completeness, and I agree with the above. -  ***

## 2022-07-10 NOTE — Progress Notes (Unsigned)
Cardiology Office Note:    Date:  07/11/2022   ID:  Berl, Bonfanti June 22, 1956, MRN [id number]  PCP:  Sigmund Hazel, MD  [facility] Cardiologist:  Lance Muss, MD  [facility] Electrophysiologist:  None   Chief Complaint: yearly follow up  History of Present Illness:    Bobby Munoz is a 66 y.o. male with a hx of  CAD, HTN, HLD, LBBB, syncope 2019, bradycardia, mild MR by echo 2019 seen for follow up.   He had a prior stress test in 2015 that was abnormal prompting cardiac cath. He underwent DESx2 to LAD in 02/2014 then CTO PCI to RCA with 4 overlapping stents in 04/2014. He had an episode of syncope in 2019 in setting of dehydration, hypotension and AKI, treated with IV fluids. Metoprolol was stopped due to bradycardia; mention of 3.1 sec pause on telemetry. 2D echo 12/2017 EF 55-60%, mild MR. 48hr monitor showed NSR average HR 61, lowest HR 44bpm, occasional PACs/PVCs. He called in in 11/2018 with fatigue and slow HR by home checks. Event monitor 01/2019 showed NSR with frequent PVCs (9%), occasional PACs, lowest HR 47bpm, average 68bpm. Dr. Eldridge Dace felt monitor was benign. He has remained on Plavix monotherapy.   Here today for follow up.  Patient has spinal stenosis and dealing with severe back pain.  He is turned down for surgery.  Limited exercise due to back problem.  He tried to walk on treadmill without incline.  Denies chest pain, shortness of breath, orthopnea, PND, syncope, lower extremity edema or melena.  Past Medical History:  Diagnosis Date   Allergic to cats    Anxiety    Arthritis    "left shoulder" (04/20/2014)   Coronary artery disease    Food allergy    GERD (gastroesophageal reflux disease)    Hearing loss    High cholesterol    Hypertension    Joint pain    LBBB (left bundle branch block) 2010   Mild mitral regurgitation    Other fatigue    Pneumonia X 1   Prediabetes    Premature atrial contractions    PVC's (premature ventricular contractions)     Seasonal allergies    "fall and spring"   Spinal stenosis     Past Surgical History:  Procedure Laterality Date   CARDIAC CATHETERIZATION  ~ 2000; 03/08/2014   CARDIAC CATHETERIZATION  04/20/2014   PCI of a chronic total occlusion in the mid right coronary artery.  There were 4 overlapping stents placed    CARDIAC CATHETERIZATION  04/20/2014   Procedure: LEFT HEART CATH;  Surgeon: Corky Crafts, MD;  Location: [facility] CATH LAB;  Service: Cardiovascular;;   CARDIOVASCULAR STRESS TEST  2011   CORONARY ANGIOPLASTY WITH STENT PLACEMENT  03/09/2014; 04/20/2014   "2 + 4"   LEFT HEART CATHETERIZATION WITH CORONARY ANGIOGRAM N/A 03/08/2014   Procedure: LEFT HEART CATHETERIZATION WITH CORONARY ANGIOGRAM;  Surgeon: Corky Crafts, MD;  Location: [facility] CATH LAB;  Service: Cardiovascular;  Laterality: N/A;   PERCUTANEOUS CORONARY STENT INTERVENTION (PCI-S) N/A 03/09/2014   Procedure: PERCUTANEOUS CORONARY STENT INTERVENTION (PCI-S);  Surgeon: Corky Crafts, MD;  Location: [facility] CATH LAB;  Service: Cardiovascular;  Laterality: N/A;   PERCUTANEOUS CORONARY STENT INTERVENTION (PCI-S) N/A 04/20/2014   Procedure: PERCUTANEOUS CORONARY STENT INTERVENTION (PCI-S);  Surgeon: Corky Crafts, MD;  Location: [facility] CATH LAB;  Service: Cardiovascular;  Laterality: N/A;   TONSILLECTOMY      Current Medications: Current Meds  Medication Sig  acetaminophen (TYLENOL) 500 MG tablet Take 500 mg by mouth every 8 (eight) hours as needed. Take two tablets by mouth up to 3 times daily as needed.   amLODipine (NORVASC) 10 MG tablet TAKE 1 TABLET BY MOUTH DAILY   atorvastatin (LIPITOR) 80 MG tablet Take 1 tablet (80 mg total) by mouth daily.   citalopram (CELEXA) 40 MG tablet Take 40 mg by mouth daily.   clopidogrel (PLAVIX) 75 MG tablet Take 1 tablet (75 mg total) by mouth daily. Please keep upcoming appointment for future refills. Thank you.   ezetimibe (ZETIA) 10 MG tablet TAKE 1 TABLET(10 MG) BY MOUTH DAILY    gabapentin (NEURONTIN) 300 MG capsule Take 300 mg by mouth. Take 3 tablets (900mg  total) by mouth as needed.   losartan (COZAAR) 100 MG tablet TAKE 1/2 TABLET BY MOUTH TWICE DAILY   nitroGLYCERIN (NITROSTAT) 0.4 MG SL tablet Place 1 tablet (0.4 mg total) under the tongue every 5 (five) minutes as needed for chest pain (X3 DOSES BEFORE CALLING 911).   pantoprazole (PROTONIX) 40 MG tablet TAKE 1 TABLET(40 MG) BY MOUTH DAILY   Semaglutide, 1 MG/DOSE, 4 MG/3ML SOPN Inject 1 mg as directed once a week.   traZODone (DESYREL) 100 MG tablet Take 100 mg by mouth at bedtime.   Vitamin D, Ergocalciferol, (DRISDOL) 1.25 MG (50000 UNIT) CAPS capsule Take 1 capsule (50,000 Units total) by mouth every 14 (fourteen) days.     Allergies:   Shellfish allergy, Shellfish-derived products, and Latex   Social History   Socioeconomic History   Marital status: Married    Spouse name:   Number of children: Not on file   Years of education: Not on file   Highest education level: Not on file  Occupational History   Occupation: Bobby Munoz  Tobacco Use   Smoking status: Never   Smokeless tobacco: Never  Vaping Use   Vaping Use: Never used  Substance and Sexual Activity   Alcohol use: Yes    Comment: 04/20/2014 "used to have couple glasses of wine/wk; none since 02/2014"   Drug use: No   Sexual activity: Yes    Partners: Female  Other Topics Concern   Not on file  Social History Narrative   Not on file   Social Determinants of Health   Financial Resource Strain: Not on file  Food Insecurity: Not on file  Transportation Needs: Not on file  Physical Activity: Not on file  Stress: Not on file  Social Connections: Not on file     Family History: The patient's family history includes Cancer in his mother; Healthy in his sister and sister; Heart attack in his father; Heart disease in his father; Heart failure in his father; Hyperlipidemia in his father; Hypertension in his father and  mother; Stroke in his father; Sudden death in his father.    ROS:   Please see the history of present illness.    All other systems reviewed and are negative.   EKGs/Labs/Other Studies Reviewed:    The following studies were reviewed today:  Monitor 01/2019 NSR, with occasional PACs and PVCs. PVCs comprised 9% of total heart beats. No sustained pathologic arrhtyhmias.   Continue medical therapy    Echo 12/2017 Left ventricle: The cavity size was normal. Systolic function was    normal. The estimated ejection fraction was in the range of 55%    to 60%. Wall motion was normal; there were no regional wall    motion abnormalities. Left ventricular diastolic  function    parameters were normal.  - Aortic valve: Transvalvular velocity was within the normal range.    There was no stenosis. There was no regurgitation.  - Mitral valve: Transvalvular velocity was within the normal range.    There was no evidence for stenosis. There was mild regurgitation.    Valve area by pressure half-time: 1.68 cm^2.  - Right ventricle: The cavity size was normal. Wall thickness was    normal. Systolic function was normal.  - Atrial septum: No defect or patent foramen ovale was identified    by color flow Doppler.  - Tricuspid valve: There was trivial regurgitation.  - Pulmonary arteries: Systolic pressure was within the normal    range. PA peak pressure: 28 mm Hg (S).   Cath 04/2014 IMPRESSIONS:    Successful PCI of a chronic total occlusion in the mid right coronary artery.  There were 4 overlapping stents placed as noted above; all of the stents were drug-eluting.   LVEDP 18 mmHg.     Cath 02/2014 IMPRESSIONS:    Successful PCI of the mid left anterior descending artery with overlapping drug-eluting stents of 2.5 x 38 and 3.0 x 38, Xience, postdilated to greater than 3.5 mm proximally.   Attempted angioplasty of the right coronary artery.  Unable to cross the entire occlusion. We were able,  with a Fielder XT wire, to cross into the lesion across the proximal cap.     RECOMMENDATION:  Continue medical therapy. If he has refractory angina, would bring him back for another PCI attempt of the RCA. At that point, would likely use a balloon supported stiffer wire to try and cross the distal cap.   Continue dual antiplatelet therapy for at least a year without interruption.         RECOMMENDATION:  Continue dual antiplatelet therapy indefinitely. He'll be watched overnight.  Stop Imdur.  EKG:  EKG is  ordered today.  The ekg ordered today demonstrates sinus rhythm, chronic left bundle branch block  Recent Labs: 06/04/2022: ALT 14; BUN 16; Creatinine, Ser 0.76; Potassium 4.5; Sodium 142  Recent Lipid Panel    Component Value Date/Time   CHOL 110 10/04/2021 0759   TRIG 136 10/04/2021 0759   HDL 39 (L) 10/04/2021 0759   CHOLHDL 2.8 10/04/2021 0759   CHOLHDL 3.4 02/15/2016 1119   VLDL 31 (H) 02/15/2016 1119   LDLCALC 47 10/04/2021 0759    Physical Exam:    VS:  BP 112/78   Pulse 74   Ht 5\' 7"  (1.702 m)   Wt 189 lb 6.4 oz (85.9 kg)   SpO2 96%   BMI 29.66 kg/m     Wt Readings from Last 3 Encounters:  07/11/22 189 lb 6.4 oz (85.9 kg)  07/02/22 180 lb (81.6 kg)  06/04/22 184 lb (83.5 kg)     GEN:  Well nourished, well developed in no acute distress HEENT: Normal NECK: No JVD; No carotid bruits LYMPHATICS: No lymphadenopathy CARDIAC: RRR, 2./6 systolic murmurs, rubs, gallops RESPIRATORY:  Clear to auscultation without rales, wheezing or rhonchi  ABDOMEN: Soft, non-tender, non-distended MUSCULOSKELETAL:  No edema; No deformity  SKIN: Warm and dry NEUROLOGIC:  Alert and oriented x 3 PSYCHIATRIC:  Normal affect   ASSESSMENT AND PLAN:    CAD No angina.  Limited exercise due to back pain from spinal stenosis.  Continue Plavix, Lipitor, Zetia and Norvasc.  2.  Hypertension Blood pressure stable and controlled on current medications  3.  Lipidemia -10/04/2021:  Cholesterol, Total 110; HDL 39; LDL Chol Calc (NIH) 47; Triglycerides 136  -Lipitor 80 mg daily and Zetia 10 mg daily  4. Mild MR - Follow clinically     Medication Adjustments/Labs and Tests Ordered: Current medicines are reviewed at length with the patient today.  Concerns regarding medicines are outlined above.  Orders Placed This Encounter  Procedures   EKG 12-Lead   No orders of the defined types were placed in this encounter.   Patient Instructions  Medication Instructions:  Your physician recommends that you continue on your current medications as directed. Please refer to the Current Medication list given to you today. *If you need a refill on your cardiac medications before your next appointment, please call your pharmacy*   Lab Work: None ordered   Testing/Procedures: None ordered   Follow-Up: At [facility], you and your health needs are our priority.  As part of our continuing mission to provide you with exceptional heart care, we have created designated Provider Care Teams.  These Care Teams include your primary Cardiologist (physician) and Advanced Practice Providers (APPs -  Physician Assistants and Nurse Practitioners) who all work together to provide you with the care you need, when you need it.  We recommend signing up for the patient portal called "MyChart".  Sign up information is provided on this After Visit Summary.  MyChart is used to connect with patients for Virtual Visits (Telemedicine).  Patients are able to view lab/test results, encounter notes, upcoming appointments, etc.  Non-urgent messages can be sent to your provider as well.   To learn more about what you can do with MyChart, go to [email or web address].    Your next appointment:   1 year(s)  The format for your next appointment:   In Person  Provider:   Larae Grooms, MD     Other Instructions   Important Information About Sugar         Jarrett Soho, [place]  07/11/2022 9:35 AM    [place]

## 2022-07-11 ENCOUNTER — Encounter: Payer: Self-pay | Admitting: Physician Assistant

## 2022-07-11 ENCOUNTER — Ambulatory Visit: Payer: Managed Care, Other (non HMO) | Attending: Physician Assistant | Admitting: Physician Assistant

## 2022-07-11 VITALS — BP 112/78 | HR 74 | Ht 67.0 in | Wt 189.4 lb

## 2022-07-11 DIAGNOSIS — E785 Hyperlipidemia, unspecified: Secondary | ICD-10-CM | POA: Diagnosis not present

## 2022-07-11 DIAGNOSIS — I2510 Atherosclerotic heart disease of native coronary artery without angina pectoris: Secondary | ICD-10-CM | POA: Diagnosis not present

## 2022-07-11 DIAGNOSIS — I340 Nonrheumatic mitral (valve) insufficiency: Secondary | ICD-10-CM | POA: Diagnosis not present

## 2022-07-11 DIAGNOSIS — I10 Essential (primary) hypertension: Secondary | ICD-10-CM | POA: Diagnosis not present

## 2022-07-11 NOTE — Patient Instructions (Signed)
Medication Instructions:  Your physician recommends that you continue on your current medications as directed. Please refer to the Current Medication list given to you today. *If you need a refill on your cardiac medications before your next appointment, please call your pharmacy*   Lab Work: None ordered   Testing/Procedures: None ordered   Follow-Up: At [facility], you and your health needs are our priority.  As part of our continuing mission to provide you with exceptional heart care, we have created designated Provider Care Teams.  These Care Teams include your primary Cardiologist (physician) and Advanced Practice Providers (APPs -  Physician Assistants and Nurse Practitioners) who all work together to provide you with the care you need, when you need it.  We recommend signing up for the patient portal called "MyChart".  Sign up information is provided on this After Visit Summary.  MyChart is used to connect with patients for Virtual Visits (Telemedicine).  Patients are able to view lab/test results, encounter notes, upcoming appointments, etc.  Non-urgent messages can be sent to your provider as well.   To learn more about what you can do with MyChart, go to [email or web address].    Your next appointment:   1 year(s)  The format for your next appointment:   In Person  Provider:   Larae Grooms, MD     Other Instructions   Important Information About Sugar

## 2022-07-29 ENCOUNTER — Other Ambulatory Visit: Payer: Self-pay | Admitting: Interventional Cardiology

## 2022-07-31 ENCOUNTER — Ambulatory Visit (INDEPENDENT_AMBULATORY_CARE_PROVIDER_SITE_OTHER): Payer: Managed Care, Other (non HMO) | Admitting: Family Medicine

## 2022-07-31 ENCOUNTER — Encounter (INDEPENDENT_AMBULATORY_CARE_PROVIDER_SITE_OTHER): Payer: Self-pay | Admitting: Family Medicine

## 2022-07-31 VITALS — BP 116/74 | HR 66 | Temp 97.7°F | Ht 67.0 in | Wt 179.0 lb

## 2022-07-31 DIAGNOSIS — E1169 Type 2 diabetes mellitus with other specified complication: Secondary | ICD-10-CM

## 2022-07-31 DIAGNOSIS — E559 Vitamin D deficiency, unspecified: Secondary | ICD-10-CM

## 2022-07-31 DIAGNOSIS — Z6828 Body mass index (BMI) 28.0-28.9, adult: Secondary | ICD-10-CM | POA: Diagnosis not present

## 2022-07-31 DIAGNOSIS — Z7985 Long-term (current) use of injectable non-insulin antidiabetic drugs: Secondary | ICD-10-CM

## 2022-07-31 DIAGNOSIS — E669 Obesity, unspecified: Secondary | ICD-10-CM | POA: Diagnosis not present

## 2022-07-31 MED ORDER — SEMAGLUTIDE (1 MG/DOSE) 4 MG/3ML ~~LOC~~ SOPN: 1.0000 mg | PEN_INJECTOR | SUBCUTANEOUS | 0 refills | Status: DC

## 2022-07-31 MED ORDER — VITAMIN D (ERGOCALCIFEROL) 1.25 MG (50000 UNIT) PO CAPS: 50000.0000 [IU] | ORAL_CAPSULE | ORAL | 0 refills | Status: DC

## 2022-08-13 NOTE — Progress Notes (Signed)
Chief Complaint:   OBESITY Bobby Munoz is here to discuss his progress with his obesity treatment plan along with follow-up of his obesity related diagnoses. Bobby Munoz is on the Category 3 Plan and states he is following his eating plan approximately 75% of the time. Bobby Munoz states he is walking for 60 minutes 5 times per week.  Today's visit was #: 23 Starting weight: 206 lbs Starting date: 11/22/2020 Today's weight: 179 lbs Today's date: 07/31/2022 Total lbs lost to date: 27 Total lbs lost since last in-office visit: 1  Interim History: Bobby Munoz continues to do well with weight loss.  He is walking, but he is dealing with back pain due to spinal stenosis and arthritis.  Subjective:   1. Type 2 diabetes mellitus with other specified complication, without long-term current use of insulin (HCC) Bobby Munoz is stable on his medications, and he denies nausea, vomiting, or constipation.  He requests a refill today.  2. Vitamin D deficiency Bobby Munoz' last vitamin D level was at goal.  He is on vitamin D prescription with no side effects noted.  Assessment/Plan:   1. Type 2 diabetes mellitus with other specified complication, without long-term current use of insulin (HCC) Bobby Munoz will continue Ozempic 1 mg once weekly, and we will refill for 1 month.  - Semaglutide, 1 MG/DOSE, 4 MG/3ML SOPN; Inject 1 mg as directed once a week.  Dispense: 3 mL; Refill: 0  2. Vitamin D deficiency We will refill prescription Vitamin D for 2 months. Bobby Munoz will follow-up for routine testing of Vitamin D, at least 2-3 times per year to avoid over-replacement.  - Vitamin D, Ergocalciferol, (DRISDOL) 1.25 MG (50000 UNIT) CAPS capsule; Take 1 capsule (50,000 Units total) by mouth every 14 (fourteen) days.  Dispense: 4 capsule; Refill: 0  3. Obesity, Current BMI 28.1 Bobby Munoz is currently in the action stage of change. As such, his goal is to continue with weight loss efforts. He has agreed to the Category 3 Plan.   Exercise goals:  As is.   Behavioral modification strategies: holiday eating strategies .  Bobby Munoz has agreed to follow-up with our clinic in 4 weeks. He was informed of the importance of frequent follow-up visits to maximize his success with intensive lifestyle modifications for his multiple health conditions.   Objective:   Blood pressure 116/74, pulse 66, temperature 97.7 F (36.5 C), height 5\' 7"  (1.702 m), weight 179 lb (81.2 kg), SpO2 99 %. Body mass index is 28.04 kg/m.  General: Cooperative, alert, well developed, in no acute distress. HEENT: Conjunctivae and lids unremarkable. Cardiovascular: Regular rhythm.  Lungs: Normal work of breathing. Neurologic: No focal deficits.   Lab Results  Component Value Date   CREATININE 0.76 06/04/2022   BUN 16 06/04/2022   NA 142 06/04/2022   K 4.5 06/04/2022   CL 104 06/04/2022   CO2 22 06/04/2022   Lab Results  Component Value Date   ALT 14 06/04/2022   AST 18 06/04/2022   ALKPHOS 48 06/04/2022   BILITOT 0.3 06/04/2022   Lab Results  Component Value Date   HGBA1C 5.8 (H) 06/04/2022   HGBA1C 5.7 (H) 01/31/2022   HGBA1C 5.9 (H) 10/04/2021   HGBA1C 5.7 (H) 06/20/2021   HGBA1C 6.5 (H) 03/07/2021   Lab Results  Component Value Date   INSULIN 14.5 06/04/2022   INSULIN 13.8 01/31/2022   INSULIN 13.5 10/04/2021   INSULIN 10.1 06/20/2021   INSULIN 12.9 03/07/2021   Lab Results  Component Value Date   TSH  2.370 11/22/2020   Lab Results  Component Value Date   CHOL 110 10/04/2021   HDL 39 (L) 10/04/2021   LDLCALC 47 10/04/2021   TRIG 136 10/04/2021   CHOLHDL 2.8 10/04/2021   Lab Results  Component Value Date   VD25OH 64.4 06/04/2022   VD25OH 46.3 01/31/2022   VD25OH 35.8 10/04/2021   Lab Results  Component Value Date   WBC 9.8 11/22/2020   HGB 15.3 11/22/2020   HCT 46.6 11/22/2020   MCV 97 11/22/2020   PLT 282 11/22/2020   No results found for: "IRON", "TIBC", "FERRITIN"  Attestation Statements:   Reviewed by clinician  on day of visit: allergies, medications, problem list, medical history, surgical history, family history, social history, and previous encounter notes.   I, Trixie Dredge, am acting as transcriptionist for Dennard Nip, MD.  I have reviewed the above documentation for accuracy and completeness, and I agree with the above. -  Dennard Nip, MD

## 2022-08-20 ENCOUNTER — Other Ambulatory Visit: Payer: Self-pay | Admitting: Interventional Cardiology

## 2022-08-28 ENCOUNTER — Ambulatory Visit (INDEPENDENT_AMBULATORY_CARE_PROVIDER_SITE_OTHER): Payer: Managed Care, Other (non HMO) | Admitting: Family Medicine

## 2022-08-28 ENCOUNTER — Other Ambulatory Visit: Payer: Self-pay | Admitting: Interventional Cardiology

## 2022-08-28 ENCOUNTER — Encounter (INDEPENDENT_AMBULATORY_CARE_PROVIDER_SITE_OTHER): Payer: Self-pay | Admitting: Family Medicine

## 2022-08-28 VITALS — BP 117/75 | HR 70 | Temp 97.9°F | Ht 67.0 in | Wt 175.0 lb

## 2022-08-28 DIAGNOSIS — E669 Obesity, unspecified: Secondary | ICD-10-CM

## 2022-08-28 DIAGNOSIS — E559 Vitamin D deficiency, unspecified: Secondary | ICD-10-CM

## 2022-08-28 DIAGNOSIS — E1169 Type 2 diabetes mellitus with other specified complication: Secondary | ICD-10-CM

## 2022-08-28 DIAGNOSIS — Z6827 Body mass index (BMI) 27.0-27.9, adult: Secondary | ICD-10-CM

## 2022-08-28 DIAGNOSIS — Z7985 Long-term (current) use of injectable non-insulin antidiabetic drugs: Secondary | ICD-10-CM

## 2022-08-28 MED ORDER — SEMAGLUTIDE (1 MG/DOSE) 4 MG/3ML ~~LOC~~ SOPN: 1.0000 mg | PEN_INJECTOR | SUBCUTANEOUS | 0 refills | Status: DC

## 2022-08-28 MED ORDER — VITAMIN D (ERGOCALCIFEROL) 1.25 MG (50000 UNIT) PO CAPS: 50000.0000 [IU] | ORAL_CAPSULE | ORAL | 0 refills | Status: DC

## 2022-09-11 NOTE — Progress Notes (Signed)
Chief Complaint:   OBESITY Bobby Munoz is here to discuss his progress with his obesity treatment plan along with follow-up of his obesity related diagnoses. Bobby Munoz is on the Category 3 Plan and states he is following his eating plan approximately 75% of the time. Bobby Munoz states he is on the treadmill for 60 minutes 5 times per week.  Today's visit was #: 24 Starting weight: 206 lbs Starting date: 11/22/2020 Today's weight: 175 lbs Today's date: 08/28/2022 Total lbs lost to date: 31 Total lbs lost since last in-office visit: 4  Interim History: Bobby Munoz continues to do well with his diet and weight loss, even over Thanksgiving. His muscle mass id falling however, even with trying to increase his protein and exercise.   Subjective:   1. Vitamin D deficiency Josede' Vitamin D level is at goal. He requires a refill.   2. Type 2 diabetes mellitus with other specified complication, without long-term current use of insulin (HCC) Bobby Munoz is working on his diet, and his polyphagia has decreased. He denies nausea, vomiting, or hypoglycemia.   Assessment/Plan:   1. Vitamin D deficiency Bobby Munoz will continue prescription Vitamin D 50,000 IU every 14 days, and we will refill for 2 months. He will follow-up for routine testing of Vitamin D, at least 2-3 times per year to avoid over-replacement.  - Vitamin D, Ergocalciferol, (DRISDOL) 1.25 MG (50000 UNIT) CAPS capsule; Take 1 capsule (50,000 Units total) by mouth every 14 (fourteen) days.  Dispense: 4 capsule; Refill: 0  2. Type 2 diabetes mellitus with other specified complication, without long-term current use of insulin (Bobby Munoz) Bobby Munoz will continue Ozempic 1 mg once weekly, and we will refill for 1 month.   - Semaglutide, 1 MG/DOSE, 4 MG/3ML SOPN; Inject 1 mg as directed once a week.  Dispense: 3 mL; Refill: 0  3. Obesity, Current BMI 27.4 Bobby Munoz is currently in the action stage of change. As such, his goal is to continue with weight loss efforts. He has  agreed to the Category 3 Plan.   Exercise goals: As is.   Behavioral modification strategies: holiday eating strategies .  Bobby Munoz has agreed to follow-up with our clinic in 4 weeks. He was informed of the importance of frequent follow-up visits to maximize his success with intensive lifestyle modifications for his multiple health conditions.   Objective:   Blood pressure 117/75, pulse 70, temperature 97.9 F (36.6 C), height 5\' 7"  (1.702 m), weight 175 lb (79.4 kg), SpO2 99 %. Body mass index is 27.41 kg/m.  General: Cooperative, alert, well developed, in no acute distress. HEENT: Conjunctivae and lids unremarkable. Cardiovascular: Regular rhythm.  Lungs: Normal work of breathing. Neurologic: No focal deficits.   Lab Results  Component Value Date   CREATININE 0.76 06/04/2022   BUN 16 06/04/2022   NA 142 06/04/2022   K 4.5 06/04/2022   CL 104 06/04/2022   CO2 22 06/04/2022   Lab Results  Component Value Date   ALT 14 06/04/2022   AST 18 06/04/2022   ALKPHOS 48 06/04/2022   BILITOT 0.3 06/04/2022   Lab Results  Component Value Date   HGBA1C 5.8 (H) 06/04/2022   HGBA1C 5.7 (H) 01/31/2022   HGBA1C 5.9 (H) 10/04/2021   HGBA1C 5.7 (H) 06/20/2021   HGBA1C 6.5 (H) 03/07/2021   Lab Results  Component Value Date   INSULIN 14.5 06/04/2022   INSULIN 13.8 01/31/2022   INSULIN 13.5 10/04/2021   INSULIN 10.1 06/20/2021   INSULIN 12.9 03/07/2021   Lab  Results  Component Value Date   TSH 2.370 11/22/2020   Lab Results  Component Value Date   CHOL 110 10/04/2021   HDL 39 (L) 10/04/2021   LDLCALC 47 10/04/2021   TRIG 136 10/04/2021   CHOLHDL 2.8 10/04/2021   Lab Results  Component Value Date   VD25OH 64.4 06/04/2022   VD25OH 46.3 01/31/2022   VD25OH 35.8 10/04/2021   Lab Results  Component Value Date   WBC 9.8 11/22/2020   HGB 15.3 11/22/2020   HCT 46.6 11/22/2020   MCV 97 11/22/2020   PLT 282 11/22/2020   No results found for: "IRON", "TIBC",  "FERRITIN"  Attestation Statements:   Reviewed by clinician on day of visit: allergies, medications, problem list, medical history, surgical history, family history, social history, and previous encounter notes.   I, Burt Knack, am acting as transcriptionist for Quillian Quince, MD.  I have reviewed the above documentation for accuracy and completeness, and I agree with the above. -  Quillian Quince, MD

## 2022-10-02 ENCOUNTER — Ambulatory Visit (INDEPENDENT_AMBULATORY_CARE_PROVIDER_SITE_OTHER): Payer: Medicare PPO | Admitting: Adult Health

## 2022-10-02 ENCOUNTER — Encounter (INDEPENDENT_AMBULATORY_CARE_PROVIDER_SITE_OTHER): Payer: Self-pay | Admitting: Adult Health

## 2022-10-02 VITALS — BP 139/84 | HR 62 | Temp 97.9°F | Ht 67.0 in | Wt 178.0 lb

## 2022-10-02 DIAGNOSIS — E1169 Type 2 diabetes mellitus with other specified complication: Secondary | ICD-10-CM | POA: Diagnosis not present

## 2022-10-02 DIAGNOSIS — Z6827 Body mass index (BMI) 27.0-27.9, adult: Secondary | ICD-10-CM

## 2022-10-02 DIAGNOSIS — I152 Hypertension secondary to endocrine disorders: Secondary | ICD-10-CM

## 2022-10-02 DIAGNOSIS — E1159 Type 2 diabetes mellitus with other circulatory complications: Secondary | ICD-10-CM

## 2022-10-02 DIAGNOSIS — E559 Vitamin D deficiency, unspecified: Secondary | ICD-10-CM

## 2022-10-02 DIAGNOSIS — E669 Obesity, unspecified: Secondary | ICD-10-CM

## 2022-10-02 DIAGNOSIS — Z7985 Long-term (current) use of injectable non-insulin antidiabetic drugs: Secondary | ICD-10-CM

## 2022-10-03 LAB — INSULIN, RANDOM: INSULIN: 14.4 u[IU]/mL (ref 2.6–24.9)

## 2022-10-03 LAB — COMPREHENSIVE METABOLIC PANEL
ALT: 20 IU/L (ref 0–44)
AST: 14 IU/L (ref 0–40)
Albumin/Globulin Ratio: 1.6 (ref 1.2–2.2)
Albumin: 4.6 g/dL (ref 3.9–4.9)
Alkaline Phosphatase: 55 IU/L (ref 44–121)
BUN/Creatinine Ratio: 22 (ref 10–24)
BUN: 17 mg/dL (ref 8–27)
Bilirubin Total: 0.9 mg/dL (ref 0.0–1.2)
CO2: 25 mmol/L (ref 20–29)
Calcium: 10 mg/dL (ref 8.6–10.2)
Chloride: 102 mmol/L (ref 96–106)
Creatinine, Ser: 0.77 mg/dL (ref 0.76–1.27)
Globulin, Total: 2.9 g/dL (ref 1.5–4.5)
Glucose: 91 mg/dL (ref 70–99)
Potassium: 4.7 mmol/L (ref 3.5–5.2)
Sodium: 144 mmol/L (ref 134–144)
Total Protein: 7.5 g/dL (ref 6.0–8.5)
eGFR: 98 mL/min/{1.73_m2} (ref >59)

## 2022-10-03 LAB — VITAMIN B12: Vitamin B-12: 550 pg/mL (ref 232–1245)

## 2022-10-03 LAB — VITAMIN D 25 HYDROXY (VIT D DEFICIENCY, FRACTURES): Vit D, 25-Hydroxy: 34.8 ng/mL (ref 30.0–100.0)

## 2022-10-03 LAB — HEMOGLOBIN A1C
Est. average glucose Bld gHb Est-mCnc: 117 mg/dL
Hgb A1c MFr Bld: 5.7 % — ABNORMAL HIGH (ref 4.8–5.6)

## 2022-10-09 MED ORDER — VITAMIN D (ERGOCALCIFEROL) 1.25 MG (50000 UNIT) PO CAPS: 50000.0000 [IU] | ORAL_CAPSULE | ORAL | 0 refills | Status: DC

## 2022-10-09 NOTE — Progress Notes (Addendum)
Chief Complaint:   OBESITY Bobby Munoz is here to discuss his progress with his obesity treatment plan along with follow-up of his obesity related diagnoses. Bobby Munoz is on the Category 3 Plan and states he is following his eating plan approximately 75% of the time. Bobby Munoz states he is walking 60 minutes 5 times per week.  Today's visit was #: 25 Starting weight: 206 lbs Starting date: 11/22/2020 Today's weight: 178 lbs Today's date: 10/02/2022 Total lbs lost to date: 28 lbs Total lbs lost since last in-office visit: +3 lbs  Interim History:  Bobby Munoz officially restired 09/12/2022! His 28 year old mother is currently staying with them, Jan-Feb, to avoid the harsh [facility] winter. He and his mother will often enjoy lunch out together.  Since his retirement he has been making homemade soups and walking on treadmill.  He will often spend time at local [employer] "as his local office".  His daughter will be married in Oct 2024- venue outside [place].   Subjective:   1. Type 2 diabetes mellitus with other specified complication, without long-term current use of insulin (HCC) Lab Results  Component Value Date   HGBA1C 5.7 (H) 10/02/2022   HGBA1C 5.8 (H) 06/04/2022   HGBA1C 5.7 (H) 01/31/2022    Patient is currently on Ozempic 1 mg weekly.  She denies mass in neck, dysphagia, dyspepsia, persistent hoarseness, abdominal pain, or N/V/Constipation.  2. Vitamin D deficiency Patient is currently on weekly ergocalciferol.   Patient denies nausea, vomiting, or muscle weakness.   3. Hypertension associated with type 2 diabetes mellitus ([place]) Patient is currently taking Norvasc 10 mg daily, Cozaar 100 mg daily.  Assessment/Plan:   1. Type 2 diabetes mellitus with other specified complication, without long-term current use of insulin (HCC) Check labs today.   - Hemoglobin A1c - Insulin, random - Vitamin B12  2. Vitamin D deficiency Check labs today and refill Ergocalciferol.   -  VITAMIN D 25 Hydroxy (Vit-D Deficiency, Fractures)  3. Hypertension associated with type 2 diabetes mellitus ([place]) Check labs today.  - Comprehensive metabolic panel  4. Obesity, Current BMI 27.9 Bobby Munoz is currently in the action stage of change. As such, his goal is to continue with weight loss efforts.  He has agreed to Cat 3 meal plan.  Exercise goals:  As is.   Behavioral modification strategies: increasing lean protein intake, decreasing simple carbohydrates, meal planning and cooking strategies, keeping healthy foods in the home, and planning for success.  Bobby Munoz has agreed to follow-up with our clinic in 4 weeks. He was informed of the importance of frequent follow-up visits to maximize his success with intensive lifestyle modifications for his multiple health conditions.   Objective:   Blood pressure 139/84, pulse 62, temperature 97.9 F (36.6 C), height 5' 7"$  (1.702 m), weight 178 lb (80.7 kg), SpO2 97 %. Body mass index is 27.88 kg/m.  General: Cooperative, alert, well developed, in no acute distress. HEENT: Conjunctivae and lids unremarkable. Cardiovascular: Regular rhythm.  Lungs: Normal work of breathing. Neurologic: No focal deficits.   Lab Results  Component Value Date   CREATININE 0.77 10/02/2022   BUN 17 10/02/2022   NA 144 10/02/2022   K 4.7 10/02/2022   CL 102 10/02/2022   CO2 25 10/02/2022   Lab Results  Component Value Date   ALT 20 10/02/2022   AST 14 10/02/2022   ALKPHOS 55 10/02/2022   BILITOT 0.9 10/02/2022   Lab Results  Component Value Date   HGBA1C  5.7 (H) 10/02/2022   HGBA1C 5.8 (H) 06/04/2022   HGBA1C 5.7 (H) 01/31/2022   HGBA1C 5.9 (H) 10/04/2021   HGBA1C 5.7 (H) 06/20/2021   Lab Results  Component Value Date   INSULIN 14.4 10/02/2022   INSULIN 14.5 06/04/2022   INSULIN 13.8 01/31/2022   INSULIN 13.5 10/04/2021   INSULIN 10.1 06/20/2021   Lab Results  Component Value Date   TSH 2.370 11/22/2020   Lab Results  Component  Value Date   CHOL 110 10/04/2021   HDL 39 (L) 10/04/2021   LDLCALC 47 10/04/2021   TRIG 136 10/04/2021   CHOLHDL 2.8 10/04/2021   Lab Results  Component Value Date   VD25OH 34.8 10/02/2022   VD25OH 64.4 06/04/2022   VD25OH 46.3 01/31/2022   Lab Results  Component Value Date   WBC 9.8 11/22/2020   HGB 15.3 11/22/2020   HCT 46.6 11/22/2020   MCV 97 11/22/2020   PLT 282 11/22/2020   No results found for: "IRON", "TIBC", "FERRITIN"  Attestation Statements:   Reviewed by clinician on day of visit: allergies, medications, problem list, medical history, surgical history, family history, social history, and previous encounter notes.  I, Bobby Munoz, RMA, am acting as Location manager for Mina Marble, NP.  I have reviewed the above documentation for accuracy and completeness, and I agree with the above. -  Bobby Munoz d. Bobby Wandersee, NP-C

## 2022-10-15 ENCOUNTER — Other Ambulatory Visit (INDEPENDENT_AMBULATORY_CARE_PROVIDER_SITE_OTHER): Payer: Self-pay | Admitting: Family Medicine

## 2022-10-15 DIAGNOSIS — E1169 Type 2 diabetes mellitus with other specified complication: Secondary | ICD-10-CM

## 2022-10-21 ENCOUNTER — Other Ambulatory Visit (INDEPENDENT_AMBULATORY_CARE_PROVIDER_SITE_OTHER): Payer: Self-pay | Admitting: Family Medicine

## 2022-10-21 DIAGNOSIS — E1169 Type 2 diabetes mellitus with other specified complication: Secondary | ICD-10-CM

## 2022-10-31 ENCOUNTER — Encounter (INDEPENDENT_AMBULATORY_CARE_PROVIDER_SITE_OTHER): Payer: Self-pay | Admitting: Adult Health

## 2022-10-31 ENCOUNTER — Ambulatory Visit (INDEPENDENT_AMBULATORY_CARE_PROVIDER_SITE_OTHER): Payer: Medicare PPO | Admitting: Adult Health

## 2022-10-31 VITALS — BP 113/73 | HR 64 | Temp 98.4°F | Ht 67.0 in | Wt 178.0 lb

## 2022-10-31 DIAGNOSIS — Z7985 Long-term (current) use of injectable non-insulin antidiabetic drugs: Secondary | ICD-10-CM

## 2022-10-31 DIAGNOSIS — E1159 Type 2 diabetes mellitus with other circulatory complications: Secondary | ICD-10-CM | POA: Diagnosis not present

## 2022-10-31 DIAGNOSIS — E1169 Type 2 diabetes mellitus with other specified complication: Secondary | ICD-10-CM

## 2022-10-31 DIAGNOSIS — I152 Hypertension secondary to endocrine disorders: Secondary | ICD-10-CM | POA: Diagnosis not present

## 2022-10-31 DIAGNOSIS — E669 Obesity, unspecified: Secondary | ICD-10-CM

## 2022-10-31 DIAGNOSIS — E559 Vitamin D deficiency, unspecified: Secondary | ICD-10-CM | POA: Diagnosis not present

## 2022-10-31 DIAGNOSIS — Z6827 Body mass index (BMI) 27.0-27.9, adult: Secondary | ICD-10-CM

## 2022-10-31 MED ORDER — SEMAGLUTIDE (1 MG/DOSE) 4 MG/3ML ~~LOC~~ SOPN: 1.0000 mg | PEN_INJECTOR | SUBCUTANEOUS | 0 refills | Status: DC

## 2022-10-31 MED ORDER — VITAMIN D (ERGOCALCIFEROL) 1.25 MG (50000 UNIT) PO CAPS: 50000.0000 [IU] | ORAL_CAPSULE | ORAL | 0 refills | Status: DC

## 2022-10-31 NOTE — Progress Notes (Signed)
Chief Complaint:   OBESITY Bobby Munoz is here to discuss his progress with his obesity treatment plan along with follow-up of his obesity related diagnoses. Bobby Munoz is on the Category 3 Plan and states he is following his eating plan approximately 70% of the time.  Bobby Munoz states he is walking 3 miles -4 times per week.  Today's visit was #: 41 Starting weight: 206 lbs Starting date: 11/22/2020 Today's weight: 178 lbs Today's date: 10/31/2022 Total lbs lost to date: 28 lbs Total lbs lost since last in-office visit: 0  Interim History:  [name] endorses acute illness the last 3 weeks. Current sx's: Nasal congestion Productive cough- clear mucus Fatigue  He denies fever, dyspnea, CP  He has been treating acute sx's with rest, copious hydration, and avoiding his 68 year old mother and new born grandchild.  He has been able to resume walking recently.  He continues to enjoy retirement!  Reviewed Bioimpedance results with pt: Muscle Mass + 10.6 lbs Adipose Mass - 11.4 lbs  Subjective:   1. Vitamin D deficiency Discussed labs  Latest Reference Range & Units 10/02/22 12:06  Vitamin D, 25-Hydroxy 30.0 - 100.0 ng/mL 34.8   He is currently on weekly Ergocalciferol- denies N/V/muscle weakness  2. Type 2 diabetes mellitus with other specified complication, without long-term current use of insulin (HCC) Lab Results  Component Value Date   HGBA1C 5.7 (H) 10/02/2022   HGBA1C 5.8 (H) 06/04/2022   HGBA1C 5.7 (H) 01/31/2022   Currently on Ozempic 66m once weekly injection- denies mass in neck, dysphagia, dyspepsia, persistent hoarseness, abdominal pain, or N/V/C  3. Hypertension associated with diabetes ([place] Discussed Labs BP at goal 10/02/22 CMP- Electrolytes and Kidney fx- stable Currently on Continue Norvasc 119mQD and Cozaar 10064m/2 BID  Assessment/Plan:   1. Vitamin D deficiency Refill - Vitamin D, Ergocalciferol, (DRISDOL) 1.25 MG (50000 UNIT) CAPS capsule; Take 1  capsule (50,000 Units total) by mouth every 7 (seven) days.  Dispense: 8 capsule; Refill: 0  2. Type 2 diabetes mellitus with other specified complication, without long-term current use of insulin (HCC) Refill - Semaglutide, 1 MG/DOSE, 4 MG/3ML SOPN; Inject 1 mg as directed once a week.  Dispense: 9 mL; Refill: 0  3. Hypertension associated with diabetes (HCC) Continue Norvasc 33m72m and Cozaar 100mg54m BID  . Obesity, Current BMI 27.9  [name] in the action stage of change. As such, his goal is to continue with weight loss efforts. He has agreed to the Category 3 Plan.   Exercise goals: Older adults should follow the adult guidelines. When older adults cannot meet the adult guidelines, they should be as physically active as their abilities and conditions will allow.   Behavioral modification strategies: increasing lean protein intake, decreasing simple carbohydrates, increasing vegetables, increasing water intake, and planning for success.  [name] to follow-up with our clinic in 4 weeks. He was informed of the importance of frequent follow-up visits to maximize his success with intensive lifestyle modifications for his multiple health conditions.   Objective:   Blood pressure 113/73, pulse 64, temperature 98.4 F (36.9 C), height 5' 7"$  (1.702 m), weight 178 lb (80.7 kg), SpO2 97 %. Body mass index is 27.88 kg/m.  General: Cooperative, alert, well developed, in no acute distress. HEENT: Conjunctivae and lids unremarkable. Cardiovascular: Regular rhythm.  Lungs: Normal work of breathing. Neurologic: No focal deficits.   Lab Results  Component Value Date   CREATININE 0.77 10/02/2022  BUN 17 10/02/2022   NA 144 10/02/2022   K 4.7 10/02/2022   CL 102 10/02/2022   CO2 25 10/02/2022   Lab Results  Component Value Date   ALT 20 10/02/2022   AST 14 10/02/2022   ALKPHOS 55 10/02/2022   BILITOT 0.9 10/02/2022   Lab Results  Component Value Date   HGBA1C  5.7 (H) 10/02/2022   HGBA1C 5.8 (H) 06/04/2022   HGBA1C 5.7 (H) 01/31/2022   HGBA1C 5.9 (H) 10/04/2021   HGBA1C 5.7 (H) 06/20/2021   Lab Results  Component Value Date   INSULIN 14.4 10/02/2022   INSULIN 14.5 06/04/2022   INSULIN 13.8 01/31/2022   INSULIN 13.5 10/04/2021   INSULIN 10.1 06/20/2021   Lab Results  Component Value Date   TSH 2.370 11/22/2020   Lab Results  Component Value Date   CHOL 110 10/04/2021   HDL 39 (L) 10/04/2021   LDLCALC 47 10/04/2021   TRIG 136 10/04/2021   CHOLHDL 2.8 10/04/2021   Lab Results  Component Value Date   VD25OH 34.8 10/02/2022   VD25OH 64.4 06/04/2022   VD25OH 46.3 01/31/2022   Lab Results  Component Value Date   WBC 9.8 11/22/2020   HGB 15.3 11/22/2020   HCT 46.6 11/22/2020   MCV 97 11/22/2020   PLT 282 11/22/2020   No results found for: "IRON", "TIBC", "FERRITIN"  Attestation Statements:   Reviewed by clinician on day of visit: allergies, medications, problem list, medical history, surgical history, family history, social history, and previous encounter notes.  I have reviewed the above documentation for accuracy and completeness, and I agree with the above. -  Jazmynn Pho d. Seleni Meller, NP-C

## 2022-11-28 ENCOUNTER — Ambulatory Visit (INDEPENDENT_AMBULATORY_CARE_PROVIDER_SITE_OTHER): Payer: Medicare PPO | Admitting: Adult Health

## 2022-11-28 ENCOUNTER — Encounter (INDEPENDENT_AMBULATORY_CARE_PROVIDER_SITE_OTHER): Payer: Self-pay | Admitting: Adult Health

## 2022-11-28 VITALS — BP 117/79 | HR 59 | Temp 98.0°F | Ht 67.0 in | Wt 174.0 lb

## 2022-11-28 DIAGNOSIS — Z6827 Body mass index (BMI) 27.0-27.9, adult: Secondary | ICD-10-CM

## 2022-11-28 DIAGNOSIS — I152 Hypertension secondary to endocrine disorders: Secondary | ICD-10-CM | POA: Diagnosis not present

## 2022-11-28 DIAGNOSIS — E669 Obesity, unspecified: Secondary | ICD-10-CM

## 2022-11-28 DIAGNOSIS — E1169 Type 2 diabetes mellitus with other specified complication: Secondary | ICD-10-CM

## 2022-11-28 DIAGNOSIS — Z7985 Long-term (current) use of injectable non-insulin antidiabetic drugs: Secondary | ICD-10-CM

## 2022-11-28 DIAGNOSIS — E1159 Type 2 diabetes mellitus with other circulatory complications: Secondary | ICD-10-CM

## 2022-11-28 DIAGNOSIS — Z7984 Long term (current) use of oral hypoglycemic drugs: Secondary | ICD-10-CM

## 2022-11-28 NOTE — Progress Notes (Signed)
WEIGHT SUMMARY AND BIOMETRICS  Vitals Temp: 60 F (36.7 C) BP: 117/79 Pulse Rate: (!) 59 SpO2: 98 %   Anthropometric Measurements Height: '5\' 7"'$  (1.702 m) Weight: 174 lb (78.9 kg) BMI (Calculated): 27.25 Weight at Last Visit: 178lb Weight Lost Since Last Visit: 4lb Starting Weight: 206lb Total Weight Loss (lbs): 32 lb (14.5 kg)   Body Composition  Body Fat %: 29.7 % Fat Mass (lbs): 51.8 lbs Muscle Mass (lbs): 116.2 lbs Total Body Water (lbs): 81.6 lbs Visceral Fat Rating : 16   Other Clinical Data Fasting: no Labs: no Today's Visit #: 27 Starting Date: 11/22/20    Chief Complaint:   OBESITY Bobby Munoz is here to discuss his progress with his obesity treatment plan. He is on the the Category 3 Plan and states he is following his eating plan approximately 80 % of the time.  He states he is exercising walking 60 minutes 5 times per week.   Interim History:  Bobby Munoz has been walking 60 mins 5 x week. His 54 year old mother left his home the beginning of March, is now settled in the [place] area with his sister.   He is settling into retirement and looking forward to his daughter's wedding in Oct 2024 (outside of [place]).  Subjective:   1. Type 2 diabetes mellitus with other specified complication, without long-term current use of insulin (HCC)  Latest Reference Range & Units 10/02/22 12:06  Glucose 70 - 99 mg/dL 91  Hemoglobin A1C 4.8 - 5.6 % 5.7 (H)  Est. average glucose Bld gHb Est-mCnc mg/dL 117  INSULIN 2.6 - 24.9 uIU/mL 14.4  (H): Data is abnormally high Rybelus 7 mg replaced with Ozempic 0.5 mg on 10/30/21.  Ozempic increased to 1 mg on 01/02/2022- has been on this strength Denies mass in neck, dysphagia, dyspepsia, persistent hoarseness, abdominal pain, or N/V/C  He denies sx's of hypoglycemia.  2. Hypertension associated with diabetes ([place]) BP at goal at [place] He denies CP when walking briskly He denies tobacco/vape use. Current medication  regime: nitroGLYCERIN (NITROSTAT) 0.4 MG SL tablet  amLODipine (NORVASC) 10 MG tablet  losartan (COZAAR) 100 MG tablet  atorvastatin (LIPITOR) 80 MG tablet  ezetimibe (ZETIA) 10 MG tablet  Semaglutide, 1 MG/DOSE, 4 MG/3ML SOPN    Assessment/Plan:   1. Type 2 diabetes mellitus with other specified complication, without long-term current use of insulin (HCC) 3 month supply provided at last OV Continue weekly Ozempic '1mg'$  injection  2. Hypertension associated with diabetes ([place]) Continue current medication regime and regular exercise.  3. Obesity, Current BMI 27.25  Bobby Munoz is currently in the action stage of change. As such, his goal is to continue with weight loss efforts. He has agreed to the Category 3 Plan.   Exercise goals: Older adults should follow the adult guidelines. When older adults cannot meet the adult guidelines, they should be as physically active as their abilities and conditions will allow.  Older adults should do exercises that maintain or improve balance if they are at risk of falling.  Older adults with chronic conditions should understand whether and how their conditions affect their ability to do regular physical activity safely.  Behavioral modification strategies: increasing lean protein intake, decreasing simple carbohydrates, increasing vegetables, increasing water intake, meal planning and cooking strategies, keeping healthy foods in the home, and planning for success.  Trinidy has agreed to follow-up with our clinic in 4 weeks. He was informed of the importance of frequent follow-up visits  to maximize his success with intensive lifestyle modifications for his multiple health conditions.   Objective:   Blood pressure 117/79, pulse (!) 59, temperature 98 F (36.7 C), height '5\' 7"'$  (1.702 m), weight 174 lb (78.9 kg), SpO2 98 %. Body mass index is 27.25 kg/m.  General: Cooperative, alert, well developed, in no acute distress. HEENT: Conjunctivae and lids  unremarkable. Cardiovascular: Regular rhythm.  Lungs: Normal work of breathing. Neurologic: No focal deficits.   Lab Results  Component Value Date   CREATININE 0.77 10/02/2022   BUN 17 10/02/2022   NA 144 10/02/2022   K 4.7 10/02/2022   CL 102 10/02/2022   CO2 25 10/02/2022   Lab Results  Component Value Date   ALT 20 10/02/2022   AST 14 10/02/2022   ALKPHOS 55 10/02/2022   BILITOT 0.9 10/02/2022   Lab Results  Component Value Date   HGBA1C 5.7 (H) 10/02/2022   HGBA1C 5.8 (H) 06/04/2022   HGBA1C 5.7 (H) 01/31/2022   HGBA1C 5.9 (H) 10/04/2021   HGBA1C 5.7 (H) 06/20/2021   Lab Results  Component Value Date   INSULIN 14.4 10/02/2022   INSULIN 14.5 06/04/2022   INSULIN 13.8 01/31/2022   INSULIN 13.5 10/04/2021   INSULIN 10.1 06/20/2021   Lab Results  Component Value Date   TSH 2.370 11/22/2020   Lab Results  Component Value Date   CHOL 110 10/04/2021   HDL 39 (L) 10/04/2021   LDLCALC 47 10/04/2021   TRIG 136 10/04/2021   CHOLHDL 2.8 10/04/2021   Lab Results  Component Value Date   VD25OH 34.8 10/02/2022   VD25OH 64.4 06/04/2022   VD25OH 46.3 01/31/2022   Lab Results  Component Value Date   WBC 9.8 11/22/2020   HGB 15.3 11/22/2020   HCT 46.6 11/22/2020   MCV 97 11/22/2020   PLT 282 11/22/2020   No results found for: "IRON", "TIBC", "FERRITIN"  Attestation Statements:   Reviewed by clinician on day of visit: allergies, medications, problem list, medical history, surgical history, family history, social history, and previous encounter notes.  Time spent on visit including pre-visit chart review and post-visit care and charting was 28 minutes.   I have reviewed the above documentation for accuracy and completeness, and I agree with the above. -  Piper Hassebrock d. Jj Enyeart, NP-C

## 2022-12-30 DIAGNOSIS — R972 Elevated prostate specific antigen [PSA]: Secondary | ICD-10-CM | POA: Diagnosis not present

## 2022-12-30 DIAGNOSIS — M47816 Spondylosis without myelopathy or radiculopathy, lumbar region: Secondary | ICD-10-CM | POA: Diagnosis not present

## 2023-01-05 ENCOUNTER — Other Ambulatory Visit (INDEPENDENT_AMBULATORY_CARE_PROVIDER_SITE_OTHER): Payer: Self-pay | Admitting: Adult Health

## 2023-01-05 DIAGNOSIS — E559 Vitamin D deficiency, unspecified: Secondary | ICD-10-CM

## 2023-01-09 ENCOUNTER — Ambulatory Visit (INDEPENDENT_AMBULATORY_CARE_PROVIDER_SITE_OTHER): Payer: Medicare PPO | Admitting: Adult Health

## 2023-01-09 ENCOUNTER — Encounter (INDEPENDENT_AMBULATORY_CARE_PROVIDER_SITE_OTHER): Payer: Self-pay | Admitting: Adult Health

## 2023-01-09 ENCOUNTER — Telehealth: Payer: Self-pay | Admitting: Physician Assistant

## 2023-01-09 VITALS — BP 92/64 | HR 50 | Temp 97.6°F | Ht 67.0 in | Wt 175.0 lb

## 2023-01-09 DIAGNOSIS — Z7985 Long-term (current) use of injectable non-insulin antidiabetic drugs: Secondary | ICD-10-CM

## 2023-01-09 DIAGNOSIS — E669 Obesity, unspecified: Secondary | ICD-10-CM | POA: Diagnosis not present

## 2023-01-09 DIAGNOSIS — E1169 Type 2 diabetes mellitus with other specified complication: Secondary | ICD-10-CM

## 2023-01-09 DIAGNOSIS — E1159 Type 2 diabetes mellitus with other circulatory complications: Secondary | ICD-10-CM

## 2023-01-09 DIAGNOSIS — Z6827 Body mass index (BMI) 27.0-27.9, adult: Secondary | ICD-10-CM

## 2023-01-09 DIAGNOSIS — E559 Vitamin D deficiency, unspecified: Secondary | ICD-10-CM | POA: Diagnosis not present

## 2023-01-09 DIAGNOSIS — I152 Hypertension secondary to endocrine disorders: Secondary | ICD-10-CM

## 2023-01-09 MED ORDER — SEMAGLUTIDE (1 MG/DOSE) 4 MG/3ML ~~LOC~~ SOPN: 1.0000 mg | PEN_INJECTOR | SUBCUTANEOUS | 0 refills | Status: DC

## 2023-01-09 MED ORDER — AMLODIPINE BESYLATE 5 MG PO TABS: 5.0000 mg | ORAL_TABLET | Freq: Every day | ORAL | 0 refills | Status: DC

## 2023-01-09 NOTE — Telephone Encounter (Signed)
-----   Message from Julaine Fusi, NP sent at 01/09/2023  2:44 PM EDT ----- Regarding: Amlodipine Good Afternooon Ms. Bobby Munoz, [name] was seen at [facility] today. His BP was quite soft: 73/47, 77/50, 94/62 He denied CP, dyspnea, endorsed fatigue. He was stable, NAD. He is on Amlodipine  QD and Losartan  1/2 BID. I reduced his Amlodipine to , no change to Losartan. I asked him to f/u with your office. Sincerely, Orpha Bur

## 2023-01-09 NOTE — Progress Notes (Addendum)
WEIGHT SUMMARY AND BIOMETRICS  Vitals Temp: 97.6 F (36.4 C) BP: 92/64 Pulse Rate: (!) 50 SpO2: 96 %   Anthropometric Measurements Height:  (1.702 m) Weight: 175 lb (79.4 kg) BMI (Calculated): 27.4 Weight at Last Visit: 174lb Weight Lost Since Last Visit: 0 Weight Gained Since Last Visit: 1lb Starting Weight: 206lb Total Weight Loss (lbs): 31 lb (14.1 kg)   Body Composition  Body Fat %: 30.9 % Fat Mass (lbs): 54.4 lbs Muscle Mass (lbs): 115.2 lbs Total Body Water (lbs): 84.6 lbs Visceral Fat Rating : 16   Other Clinical Data Fasting: no Labs: no Today's Visit #: 28 Starting Date: 11/22/20    Chief Complaint:   OBESITY Bobby Munoz is here to discuss his progress with his obesity treatment plan. He is on the the Category 3 Plan and states he is following his eating plan approximately 70 % of the time.  He states he is exercising Walking/Gym 60 minutes 5 times per week.   Interim History:  Bobby Munoz reports performing strenuous yard work, then participating in a "heavy lifting day" at gym. He feels that he may be a but dehydrated as well. First BP checks: BP 73/47 BP 77/50  He denies acute cardiac sx's, however endorses fatigue.  He denies hematuria, hematochezia, or any usual bleeding or bruising. He denies back pain. He denies change in vision or HA.  He denies sx's of hypoglycemia.  Subjective:   1. Type 2 diabetes mellitus with other specified complication, without long-term current use of insulin Lab Results  Component Value Date   HGBA1C 5.7 (H) 10/02/2022   HGBA1C 5.8 (H) 06/04/2022   HGBA1C 5.7 (H) 01/31/2022   Rybelsus was replaced with Ozempic- he is currently on  once weekly injection. Denies mass in neck, dysphagia, dyspepsia, persistent hoarseness, abdominal pain, or N/V/C   2. Vitamin D deficiency  Latest Reference Range & Units 10/04/21 07:59 01/31/22 09:21 06/04/22 09:10 10/02/22 12:06  Vitamin D, 25-Hydroxy 30.0 - 100.0  ng/mL 35.8 46.3 64.4 34.8  He is on weekly Ergocalciferol- denies N/V/Muscle Weakness  3. Hypertension associated with type 2 diabetes mellitus BP 73/47 BP 77/50 After sitting and drinking fluids, BP: 92/64 He denies CP/dyspnea He endorses fatigue Cards/Dr. Eldridge Dace manages: Amlodipine  QD and Losartan  1/2 tab BID Atorvastatin  QD, Ezeimibe  QD, Pantoprazole , and Nitro PRN  Bobby Munoz cannot recall the last time he used PRN Nitro  Bioempedence reflects water weight decreased by 3 lbs since last OV  He is followed by [facility]- Last OV:07/11/2022 with Manson Passey, PA  Also followed by Ronie Spies, PA-C Cardiology/Dr. Eldridge Dace  He has lost 35 lbs and endorses significant reduction in stress since recently retiring.  EPIC review reveals, BP trending down as weight has ensued.  Assessment/Plan:   1. Type 2 diabetes mellitus with other specified complication, without long-term current use of insulin Refill - Semaglutide, 1 MG/DOSE, 4 MG/3ML SOPN; Inject 1 mg as directed once a week.  Dispense: 9 mL; Refill: 0  2. Vitamin D deficiency Continue weekly Ergocalciferol  Vitamin D, Ergocalciferol, (DRISDOL) 1.25 MG (50000 UNIT) CAPS capsule  3. Hypertension associated with type 2 diabetes mellitus Decrease Amlodipine from  to  QD- modified Rx sent in Continue Losartan  1/2 tab BID Message sent to Cards Increase water Instructed to f/u with Cards. Discussed Red Flag sx's and if any develop- seek immediate medical care. Pt. Verbalized understanding/agreement.  4. Obesity, Current BMI 27.25  Bobby Munoz is  currently in the action stage of change. As such, his goal is to continue with weight loss efforts. He has agreed to the Category 3 Plan.   Exercise goals: Older adults should follow the adult guidelines. When older adults cannot meet the adult guidelines, they should be as physically active as their abilities and conditions will allow.  Older  adults should do exercises that maintain or improve balance if they are at risk of falling.  Older adults should determine their level of effort for physical activity relative to their level of fitness.  Older adults with chronic conditions should understand whether and how their conditions affect their ability to do regular physical activity safely.  Behavioral modification strategies: increasing lean protein intake, decreasing simple carbohydrates, increasing vegetables, increasing water intake, increasing high fiber foods, decreasing eating out, no skipping meals, meal planning and cooking strategies, keeping healthy foods in the home, ways to avoid boredom eating, and planning for success.  Bobby Munoz has agreed to follow-up with our clinic in 4 weeks. He was informed of the importance of frequent follow-up visits to maximize his success with intensive lifestyle modifications for his multiple health conditions.   Objective:   Blood pressure 92/64, pulse (!) 50, temperature 97.6 F (36.4 C), height  (1.702 m), weight 175 lb (79.4 kg), SpO2 96 %. Body mass index is 27.41 kg/m.  General: Cooperative, alert, well developed, in no acute distress. Pt appears pink, well perfused. HEENT: Conjunctivae and lids unremarkable. Cardiovascular: Regular rhythm.  Lungs: Normal work of breathing. No adventitious breath sounds noted. Neurologic: No focal deficits.   Lab Results  Component Value Date   CREATININE 0.77 10/02/2022   BUN 17 10/02/2022   NA 144 10/02/2022   K 4.7 10/02/2022   CL 102 10/02/2022   CO2 25 10/02/2022   Lab Results  Component Value Date   ALT 20 10/02/2022   AST 14 10/02/2022   ALKPHOS 55 10/02/2022   BILITOT 0.9 10/02/2022   Lab Results  Component Value Date   HGBA1C 5.7 (H) 10/02/2022   HGBA1C 5.8 (H) 06/04/2022   HGBA1C 5.7 (H) 01/31/2022   HGBA1C 5.9 (H) 10/04/2021   HGBA1C 5.7 (H) 06/20/2021   Lab Results  Component Value Date   INSULIN 14.4 10/02/2022    INSULIN 14.5 06/04/2022   INSULIN 13.8 01/31/2022   INSULIN 13.5 10/04/2021   INSULIN 10.1 06/20/2021   Lab Results  Component Value Date   TSH 2.370 11/22/2020   Lab Results  Component Value Date   CHOL 110 10/04/2021   HDL 39 (L) 10/04/2021   LDLCALC 47 10/04/2021   TRIG 136 10/04/2021   CHOLHDL 2.8 10/04/2021   Lab Results  Component Value Date   VD25OH 34.8 10/02/2022   VD25OH 64.4 06/04/2022   VD25OH 46.3 01/31/2022   Lab Results  Component Value Date   WBC 9.8 11/22/2020   HGB 15.3 11/22/2020   HCT 46.6 11/22/2020   MCV 97 11/22/2020   PLT 282 11/22/2020   No results found for: "IRON", "TIBC", "FERRITIN"  Attestation Statements:   Reviewed by clinician on day of visit: allergies, medications, problem list, medical history, surgical history, family history, social history, and previous encounter notes.  I have reviewed the above documentation for accuracy and completeness, and I agree with the above. -  Nardos Putnam d. Napoleon Monacelli, NP-C

## 2023-01-09 NOTE — Telephone Encounter (Signed)
Spoke with patient and discussed recommendations from Ronie Spies, PA-C:  Reviewed staff message sent below with concern of hypotension. Patient of Dr. Eldridge Dace. I last evaluated patient in 2022. Chelsea Aus saw patient in 2023. Last labs were drawn in 09/2022. Weight down 14lb from last OV.    Given severity of hypotension down into the 70s I would typically recommend expedited workup in the emergency department to exclude other medical issues including GIB, AKI, infection, etc. He should not drive himself. The hope would be that his low BPs are simply the reflection of his weight loss but this low of BP warrants ASAP evaluation with labwork today. Will route this note to triage to call patient to let him know this. Would please tell him to hold his amlodipine and losartan completely until evaluated. If he declines ER evaluation would arrange ASAP f/u with our office but regardless, needs next available OV. Do not drive until evaluated further.   Thank you! Laurann Montana, PA-C    Patient verbalized understanding and states he will not be going to ED today. He states he is out of town until Monday. He is not driving. Encouraged patient to be evaluated in ED if able, and to have someone drive him. Scheduled next available appt with DOD Dr. Nelly Laurence on 01/15/23 in case he does not go to ED.  Patient states he believes he is dehydrated and is drinking fluids with electrolytes to improve BP. He states he is feeling better since increasing his fluid intake.

## 2023-01-09 NOTE — Telephone Encounter (Addendum)
Reviewed staff message sent below with concern of hypotension. Patient of Dr. Eldridge Dace. I last evaluated patient in 2022. Chelsea Aus saw patient in 2023. Last labs were drawn in 09/2022. Weight down 14lb from last OV.   Given severity of hypotension down into the 70s I would typically recommend expedited workup in the emergency department to exclude other medical issues including GIB, AKI, infection, etc. He should not drive himself. The hope would be that his low BPs are simply the reflection of his weight loss but this low of BP warrants ASAP evaluation with labwork today. Will route this note to triage to call patient to let him know this. Would please tell him to hold his amlodipine and losartan completely until evaluated. If he declines ER evaluation would arrange ASAP f/u with our office but regardless, needs next available OV. Do not drive until evaluated further.  Thank you! Laurann Montana, PA-C

## 2023-01-15 ENCOUNTER — Ambulatory Visit: Payer: Medicare PPO | Attending: Cardiovascular Disease | Admitting: Cardiovascular Disease

## 2023-01-15 ENCOUNTER — Ambulatory Visit: Payer: Self-pay | Admitting: Physician Assistant

## 2023-01-15 ENCOUNTER — Encounter: Payer: Self-pay | Admitting: Cardiovascular Disease

## 2023-01-15 VITALS — BP 126/82 | HR 88 | Ht 67.0 in | Wt 172.0 lb

## 2023-01-15 DIAGNOSIS — E861 Hypovolemia: Secondary | ICD-10-CM

## 2023-01-15 DIAGNOSIS — I2510 Atherosclerotic heart disease of native coronary artery without angina pectoris: Secondary | ICD-10-CM

## 2023-01-15 DIAGNOSIS — I10 Essential (primary) hypertension: Secondary | ICD-10-CM | POA: Diagnosis not present

## 2023-01-15 MED ORDER — NITROGLYCERIN 0.4 MG SL SUBL: 0.4000 mg | SUBLINGUAL_TABLET | SUBLINGUAL | 3 refills | Status: AC | PRN

## 2023-01-15 NOTE — Progress Notes (Signed)
Electrophysiology Office Note:    Date:  01/15/2023   ID:  Bobby Munoz 06-Aug-1956, MRN [id number]  PCP:  Sigmund Hazel, MD   [facility] Providers Cardiologist:  Lance Muss, MD     Referring MD: Sigmund Hazel, MD   History of Present Illness:    Bobby Munoz is a 67 y.o. male with a hx listed below, significant for hypertension, CAD referred for urgent evaluation of orthostatic hypotension.  He called on to our office last week noting severely decreased blood pressures of about 70 systolic.  This occurred after working all day in the yard in the hot sun and going for a workout without having much to drink.  He held his losartan and drink a lot of fluids.  His blood pressures improved significantly, and he restarted the losartan.  Blood pressures at home have typically been in the 120s over 70s.  Past Medical History:  Diagnosis Date   Allergic to cats    Anxiety    Arthritis    "left shoulder" (04/20/2014)   Coronary artery disease    Food allergy    GERD (gastroesophageal reflux disease)    Hearing loss    High cholesterol    Hypertension    Joint pain    LBBB (left bundle branch block) 2010   Mild mitral regurgitation    Other fatigue    Pneumonia X 1   Prediabetes    Premature atrial contractions    PVC's (premature ventricular contractions)    Seasonal allergies    "fall and spring"   Spinal stenosis     Past Surgical History:  Procedure Laterality Date   CARDIAC CATHETERIZATION  ~ 2000; 03/08/2014   CARDIAC CATHETERIZATION  04/20/2014   PCI of a chronic total occlusion in the mid right coronary artery.  There were 4 overlapping stents placed    CARDIAC CATHETERIZATION  04/20/2014   Procedure: LEFT HEART CATH;  Surgeon: Corky Crafts, MD;  Location: [facility] CATH LAB;  Service: Cardiovascular;;   CARDIOVASCULAR STRESS TEST  2011   CORONARY ANGIOPLASTY WITH STENT PLACEMENT  03/09/2014; 04/20/2014   "2 + 4"   LEFT HEART CATHETERIZATION WITH CORONARY  ANGIOGRAM N/A 03/08/2014   Procedure: LEFT HEART CATHETERIZATION WITH CORONARY ANGIOGRAM;  Surgeon: Corky Crafts, MD;  Location: [facility] CATH LAB;  Service: Cardiovascular;  Laterality: N/A;   PERCUTANEOUS CORONARY STENT INTERVENTION (PCI-S) N/A 03/09/2014   Procedure: PERCUTANEOUS CORONARY STENT INTERVENTION (PCI-S);  Surgeon: Corky Crafts, MD;  Location: [facility] CATH LAB;  Service: Cardiovascular;  Laterality: N/A;   PERCUTANEOUS CORONARY STENT INTERVENTION (PCI-S) N/A 04/20/2014   Procedure: PERCUTANEOUS CORONARY STENT INTERVENTION (PCI-S);  Surgeon: Corky Crafts, MD;  Location: [facility] CATH LAB;  Service: Cardiovascular;  Laterality: N/A;   TONSILLECTOMY      Current Medications: Current Meds  Medication Sig   acetaminophen (TYLENOL) 500 MG tablet Take 500 mg by mouth every 8 (eight) hours as needed. Take two tablets by mouth up to 3 times daily as needed.   amLODipine (NORVASC) 5 MG tablet Take 1 tablet (5 mg total) by mouth daily.   atorvastatin (LIPITOR) 80 MG tablet TAKE 1 TABLET(80 MG) BY MOUTH DAILY   citalopram (CELEXA) 40 MG tablet Take 40 mg by mouth daily.   clopidogrel (PLAVIX) 75 MG tablet TAKE 1 TABLET BY MOUTH EVERY DAY KEEP APPOINTMENT   ezetimibe (ZETIA) 10 MG tablet TAKE 1 TABLET(10 MG) BY MOUTH DAILY   gabapentin (NEURONTIN) 300 MG capsule Take  300 mg by mouth. Take 3 tablets (900mg  total) by mouth as needed.   losartan (COZAAR) 100 MG tablet TAKE 1/2 TABLET BY MOUTH TWICE DAILY   nitroGLYCERIN (NITROSTAT) 0.4 MG SL tablet Place 1 tablet (0.4 mg total) under the tongue every 5 (five) minutes as needed for chest pain (X3 DOSES BEFORE CALLING 911).   pantoprazole (PROTONIX) 40 MG tablet TAKE 1 TABLET(40 MG) BY MOUTH DAILY   Semaglutide, 1 MG/DOSE, 4 MG/3ML SOPN Inject 1 mg as directed once a week.   tiZANidine (ZANAFLEX) 4 MG tablet Take 4 mg by mouth at bedtime.   traMADol (ULTRAM) 50 MG tablet Take 50 mg by mouth as needed.   traZODone (DESYREL) 100 MG tablet Take 100  mg by mouth at bedtime.   Vitamin D, Ergocalciferol, (DRISDOL) 1.25 MG (50000 UNIT) CAPS capsule Take 1 capsule (50,000 Units total) by mouth every 7 (seven) days.     Allergies:   Shellfish allergy, Shellfish-derived products, and Latex   Social and Family History: Reviewed in Epic  ROS:   Please see the history of present illness.    All other systems reviewed and are negative.  EKGs/Labs/Other Studies Reviewed Today:    Echocardiogram:  TTE 12/29/2027 - Left ventricle: The cavity size was normal. Systolic function was    normal. The estimated ejection fraction was in the range of 55%    to 60%. Wall motion was normal; there were no regional wall    motion abnormalities. Left ventricular diastolic function    parameters were normal.  - Aortic valve: Transvalvular velocity was within the normal range.    There was no stenosis. There was no regurgitation.  - Mitral valve: Transvalvular velocity was within the normal range.    There was no evidence for stenosis. There was mild regurgitation.    Valve area by pressure half-time: 1.68 cm^2.  - Right ventricle: The cavity size was normal. Wall thickness was    normal. Systolic function was normal.  - Atrial septum: No defect or patent foramen ovale was identified    by color flow Doppler.  - Tricuspid valve: There was trivial regurgitation.    Monitors:   Stress testing:   Advanced imaging:     EKG:  Last EKG results: today -- sinus rhythm 88 bpm with LBBB (old)   Recent Labs: 10/02/2022: ALT 20; BUN 17; Creatinine, Ser 0.77; Potassium 4.7; Sodium 144     Physical Exam:    VS:  BP 126/82   Pulse 88   Ht 5\' 7"  (1.702 m)   Wt 172 lb (78 kg)   SpO2 97%   BMI 26.94 kg/m     Wt Readings from Last 3 Encounters:  01/15/23 172 lb (78 kg)  01/09/23 175 lb (79.4 kg)  11/28/22 174 lb (78.9 kg)     GEN: Well nourished, well developed in no acute distress CARDIAC: RRR, no murmurs, rubs, gallops RESPIRATORY:   Normal work of breathing MUSCULOSKELETAL: no edema    ASSESSMENT & PLAN:    Orthostatic hypotension Likely due to severe dehydration. Resolved; I encouraged generous fluid intake  Hypertension Bps are reasonably well-controlled on current regimen Continue losartan 50 and amlodipine 5 Renal function checked last month was normal [facility] Physician labs reviewed)  CAD Continue atorvastatin 80, clopidogrel 75        Medication Adjustments/Labs and Tests Ordered: Current medicines are reviewed at length with the patient today.  Concerns regarding medicines are outlined above.  No orders of the defined  types were placed in this encounter.  No orders of the defined types were placed in this encounter.    Signed, [name], MD  01/15/2023 9:02 AM    [facility]

## 2023-01-15 NOTE — Patient Instructions (Signed)
Medication Instructions:  Your physician recommends that you continue on your current medications as directed. Please refer to the Current Medication list given to you today. *If you need a refill on your cardiac medications before your next appointment, please call your pharmacy*   Follow-Up: At [facility], you and your health needs are our priority.  As part of our continuing mission to provide you with exceptional heart care, we have created designated Provider Care Teams.  These Care Teams include your primary Cardiologist (physician) and Advanced Practice Providers (APPs -  Physician Assistants and Nurse Practitioners) who all work together to provide you with the care you need, when you need it.  We recommend signing up for the patient portal called "MyChart".  Sign up information is provided on this After Visit Summary.  MyChart is used to connect with patients for Virtual Visits (Telemedicine).  Patients are able to view lab/test results, encounter notes, upcoming appointments, etc.  Non-urgent messages can be sent to your provider as well.   To learn more about what you can do with MyChart, go to [email or web address].    Your next appointment:   With primary Cardiologist  Provider:   Lance Muss, MD

## 2023-01-18 NOTE — Telephone Encounter (Signed)
Hi Mr. Whary.  For now, I think it is fine to stop the losartan and monitor.  Longer term, losartan may be a better medicine for you than amlodipine for other secondary cardiac benefits, so it may have been better to stop the amlodipine, but we can make adjustments in the future.  Before making any more changes, lets see how your BP does for now.  We could always call in losartan 25 mg tabs if the BP increases.  Send [place] a message next week with some more BP readings.  THanks.

## 2023-01-28 MED ORDER — AMLODIPINE BESYLATE 2.5 MG PO TABS: 2.5000 mg | ORAL_TABLET | Freq: Every day | ORAL | 3 refills | Status: DC

## 2023-01-28 NOTE — Telephone Encounter (Signed)
Can decrease amlodipine to 2.5 mg daily

## 2023-01-31 ENCOUNTER — Telehealth: Payer: Self-pay

## 2023-01-31 DIAGNOSIS — R3912 Poor urinary stream: Secondary | ICD-10-CM | POA: Diagnosis not present

## 2023-01-31 DIAGNOSIS — N403 Nodular prostate with lower urinary tract symptoms: Secondary | ICD-10-CM | POA: Diagnosis not present

## 2023-01-31 DIAGNOSIS — R972 Elevated prostate specific antigen [PSA]: Secondary | ICD-10-CM | POA: Diagnosis not present

## 2023-01-31 NOTE — Telephone Encounter (Signed)
   Pre-operative Risk Assessment    Patient Name: Bobby Munoz  DOB: Aug 31, 1956 MRN: [id number]      Request for Surgical Clearance    Procedure:   PROSTATE BIOPSY AND ULTRASOUND   Date of Surgery:  Clearance TBD                                 Surgeon:  DR. Annabell Howells Surgeon's Group or Practice Name:  [facility]  Phone number:  [phone] Fax number:  [phone]   Type of Clearance Requested:   - Pharmacy:  Hold Clopidogrel (Plavix) 5 DAYS PRIOR   Type of Anesthesia:  Not Indicated   Additional requests/questions:    SignedMichaelle Copas   01/31/2023, 4:10 PM

## 2023-02-03 NOTE — Telephone Encounter (Signed)
   Name: Bobby Munoz  DOB: [birth year]  MRN: [id number]   Primary Cardiologist: Lance Muss, MD  Chart reviewed as part of pre-operative protocol coverage. Patient was contacted 02/03/2023 in reference to pre-operative risk assessment for pending surgery as outlined below.  Bobby Munoz was last seen on 01/15/2023 by Dr. Nelly Laurence. Since that day, Bobby Munoz has done well from a cardiac standpoint. He denies any new symptoms or concerns.  He is able to complete greater than 4 METS without difficulty.  Therefore, based on ACC/AHA guidelines, the patient would be at acceptable risk for the planned procedure without further cardiovascular testing.   Patient was previously cleared to hold Plavix for 5 days prior to the procedure. Therefore,  he may hold Plavix for 5 days prior to procedure. Please resume Plavix as soon as possible postprocedure, the discretion of the surgeon.   The patient was advised that if he develops new symptoms prior to surgery to contact our office to arrange for a follow-up visit, and he verbalized understanding.  I will route this recommendation to the requesting party via Epic fax function and remove from pre-op pool. Please call with questions.  Joylene Grapes, NP 02/03/2023, 12:18 PM

## 2023-02-03 NOTE — Telephone Encounter (Signed)
   Patient Name: Bobby Munoz  DOB: 08-Jun-1956 MRN: [id number]  Primary Cardiologist: Lance Muss, MD  Chart reviewed as part of pre-operative protocol coverage.  Attempted to contact patient to discuss surgical clearance request for upcoming prostate biopsy.  No answer.  Left message for patient to call back at earliest convenience.   Patient was previously cleared to hold Plavix for 5 days prior to the procedure.  Therefore, if he is without any new symptoms or concerns, he may hold Plavix for 5 days prior to procedure.  Please resume Plavix as soon as possible postprocedure, the discretion of the surgeon.   Joylene Grapes, NP 02/03/2023, 11:31 AM

## 2023-02-06 ENCOUNTER — Ambulatory Visit (INDEPENDENT_AMBULATORY_CARE_PROVIDER_SITE_OTHER): Payer: Medicare PPO | Admitting: Adult Health

## 2023-02-24 ENCOUNTER — Other Ambulatory Visit: Payer: Self-pay | Admitting: Interventional Cardiology

## 2023-03-06 ENCOUNTER — Encounter (INDEPENDENT_AMBULATORY_CARE_PROVIDER_SITE_OTHER): Payer: Self-pay | Admitting: Adult Health

## 2023-03-06 ENCOUNTER — Ambulatory Visit (INDEPENDENT_AMBULATORY_CARE_PROVIDER_SITE_OTHER): Payer: Medicare PPO | Admitting: Adult Health

## 2023-03-06 VITALS — BP 121/76 | HR 83 | Temp 98.1°F | Ht 67.0 in | Wt 166.0 lb

## 2023-03-06 DIAGNOSIS — E669 Obesity, unspecified: Secondary | ICD-10-CM

## 2023-03-06 DIAGNOSIS — Z6826 Body mass index (BMI) 26.0-26.9, adult: Secondary | ICD-10-CM

## 2023-03-06 DIAGNOSIS — E559 Vitamin D deficiency, unspecified: Secondary | ICD-10-CM | POA: Diagnosis not present

## 2023-03-06 DIAGNOSIS — E1159 Type 2 diabetes mellitus with other circulatory complications: Secondary | ICD-10-CM | POA: Diagnosis not present

## 2023-03-06 DIAGNOSIS — Z7985 Long-term (current) use of injectable non-insulin antidiabetic drugs: Secondary | ICD-10-CM

## 2023-03-06 DIAGNOSIS — I152 Hypertension secondary to endocrine disorders: Secondary | ICD-10-CM | POA: Diagnosis not present

## 2023-03-06 DIAGNOSIS — E1169 Type 2 diabetes mellitus with other specified complication: Secondary | ICD-10-CM | POA: Diagnosis not present

## 2023-03-06 MED ORDER — SEMAGLUTIDE (1 MG/DOSE) 4 MG/3ML ~~LOC~~ SOPN: 1.0000 mg | PEN_INJECTOR | SUBCUTANEOUS | 0 refills | Status: DC

## 2023-03-06 MED ORDER — VITAMIN D (ERGOCALCIFEROL) 1.25 MG (50000 UNIT) PO CAPS: 50000.0000 [IU] | ORAL_CAPSULE | ORAL | 0 refills | Status: DC

## 2023-03-06 NOTE — Progress Notes (Signed)
WEIGHT SUMMARY AND BIOMETRICS  Vitals Temp: 98.1 F (36.7 C) BP: 121/76 Pulse Rate: 83 SpO2: 97 %   Anthropometric Measurements Height: 5\' 7"  (1.702 m) Weight: 166 lb (75.3 kg) BMI (Calculated): 25.99 Weight at Last Visit: 175lb Weight Lost Since Last Visit: 9lb Weight Gained Since Last Visit: 0 Starting Weight: 206lb Total Weight Loss (lbs): 40 lb (18.1 kg)   Body Composition  Body Fat %: 28.2 % Fat Mass (lbs): 47 lbs Muscle Mass (lbs): 113.8 lbs Total Body Water (lbs): 83.8 lbs Visceral Fat Rating : 15   Other Clinical Data Fasting: no Labs: no Today's Visit #: 29 Starting Date: 11/22/20    Chief Complaint:   OBESITY Bobby Munoz is here to discuss his progress with his obesity treatment plan. He is on the the Category 3 Plan and states he is following his eating plan approximately 70 % of the time. He states he is exercising Walking/YardWork 60 minutes 5 times per week.   Interim History:  This is Bobby Munoz firs f/u at [facility] since his last OV on 01/09/23: hypotension  He did not require emergency medical assistance, and even traveled to [facility] in [place], [place] for 4 days in late April 2024. He has since increased daily water intake. He followed up with cards 01/15/2023 Per pt- Losartan 50mg  stopped and Amlodipine 5mg  decreased to 2.5mg - MAR reflects this, however note contradicts this. BP EXCELLENT AT OV today!  Subjective:   1. Hypertension associated with type 2 diabetes mellitus [facility]) This is Bobby Munoz firs f/u at [facility] since his last OV on 01/09/23: hypotension  He did not require emergency medical assistance, and even traveled to [facility] in [place], [place] for 4 days in late April 2024. He has since increased daily water intake. He followed up with cards 01/15/2023 Per pt- Losartan 50mg  stopped and Amlodipine 5mg  decreased to 2.5mg - MAR reflects this, however note contradicts this. BP EXCELLENT AT OV today! Per MAR his is currently on:    amLODipine (NORVASC) 2.5 MG tablet    Take 1 tablet (2.5 mg total) by mouth daily   Home readings: SBP 110-120s DBP 70-80  2. Type 2 diabetes mellitus with other specified complication, without long-term current use of insulin (HCC) Lab Results  Component Value Date   HGBA1C 5.7 (H) 10/02/2022   HGBA1C 5.8 (H) 06/04/2022   HGBA1C 5.7 (H) 01/31/2022   He is not checking CBG at home, he denies sx's of hypoglycemia. He is currently on Ozempic 1mg  weekly injection Denies mass in neck, dysphagia, dyspepsia, persistent hoarseness, abdominal pain, or N/V/C   3. Vitamin D deficiency  Latest Reference Range & Units 01/31/22 09:21 06/04/22 09:10 10/02/22 12:06  Vitamin D, 25-Hydroxy 30.0 - 100.0 ng/mL 46.3 64.4 34.8  He is on weekly Ergocalciferol- denies N/V/Muscle Weakness  Assessment/Plan:   1. Hypertension associated with type 2 diabetes mellitus (HCC) Continue daily low dose CCB Remain well hydrated and closely monitor home BP and for sx's of hypotension. If sx's develop of SBP <90- contact Cards. Pt verbalized understanding and agreement  2. Type 2 diabetes mellitus with other specified complication, without long-term current use of insulin (HCC) Refill - Semaglutide, 1 MG/DOSE, 4 MG/3ML SOPN; Inject 1 mg as directed once a week.  Dispense: 9 mL; Refill: 0 Monitor for sx's of hypoglycemia If any develop- please contact [facility]. Pt verbalized understanding and agreement  3. Vitamin D deficiency Refill - Vitamin D, Ergocalciferol, (DRISDOL) 1.25 MG (50000 UNIT) CAPS capsule; Take  1 capsule (50,000 Units total) by mouth every 7 (seven) days.  Dispense: 8 capsule; Refill: 0  4. Obesity, Current BMI 25.99  Bobby Munoz is currently in the action stage of change. As such, his goal is to continue with weight loss efforts. He has agreed to the Category 3 Plan.   Exercise goals: Older adults should follow the adult guidelines. When older adults cannot meet the adult guidelines, they should be as  physically active as their abilities and conditions will allow.  Older adults should do exercises that maintain or improve balance if they are at risk of falling.  Older adults with chronic conditions should understand whether and how their conditions affect their ability to do regular physical activity safely.  Behavioral modification strategies: increasing lean protein intake, decreasing simple carbohydrates, increasing vegetables, increasing water intake, meal planning and cooking strategies, and planning for success.  Bobby Munoz has agreed to follow-up with our clinic in 4 weeks. He was informed of the importance of frequent follow-up visits to maximize his success with intensive lifestyle modifications for his multiple health conditions.   Check Fasting Labs at next OV  Objective:   Blood pressure 121/76, pulse 83, temperature 98.1 F (36.7 C), height 5\' 7"  (1.702 m), weight 166 lb (75.3 kg), SpO2 97 %. Body mass index is 26 kg/m.  General: Cooperative, alert, well developed, in no acute distress. HEENT: Conjunctivae and lids unremarkable. Cardiovascular: Regular rhythm.  Lungs: Normal work of breathing. Neurologic: No focal deficits.   Lab Results  Component Value Date   CREATININE 0.77 10/02/2022   BUN 17 10/02/2022   NA 144 10/02/2022   K 4.7 10/02/2022   CL 102 10/02/2022   CO2 25 10/02/2022   Lab Results  Component Value Date   ALT 20 10/02/2022   AST 14 10/02/2022   ALKPHOS 55 10/02/2022   BILITOT 0.9 10/02/2022   Lab Results  Component Value Date   HGBA1C 5.7 (H) 10/02/2022   HGBA1C 5.8 (H) 06/04/2022   HGBA1C 5.7 (H) 01/31/2022   HGBA1C 5.9 (H) 10/04/2021   HGBA1C 5.7 (H) 06/20/2021   Lab Results  Component Value Date   INSULIN 14.4 10/02/2022   INSULIN 14.5 06/04/2022   INSULIN 13.8 01/31/2022   INSULIN 13.5 10/04/2021   INSULIN 10.1 06/20/2021   Lab Results  Component Value Date   TSH 2.370 11/22/2020   Lab Results  Component Value Date   CHOL 110  10/04/2021   HDL 39 (L) 10/04/2021   LDLCALC 47 10/04/2021   TRIG 136 10/04/2021   CHOLHDL 2.8 10/04/2021   Lab Results  Component Value Date   VD25OH 34.8 10/02/2022   VD25OH 64.4 06/04/2022   VD25OH 46.3 01/31/2022   Lab Results  Component Value Date   WBC 9.8 11/22/2020   HGB 15.3 11/22/2020   HCT 46.6 11/22/2020   MCV 97 11/22/2020   PLT 282 11/22/2020   No results found for: "IRON", "TIBC", "FERRITIN"  Attestation Statements:   Reviewed by clinician on day of visit: allergies, medications, problem list, medical history, surgical history, family history, social history, and previous encounter notes.  I have reviewed the above documentation for accuracy and completeness, and I agree with the above. -  Hannah Crill d. Nayab Aten, NP-C

## 2023-03-17 DIAGNOSIS — C61 Malignant neoplasm of prostate: Secondary | ICD-10-CM

## 2023-03-17 HISTORY — DX: Malignant neoplasm of prostate: C61

## 2023-03-26 ENCOUNTER — Other Ambulatory Visit: Payer: Self-pay | Admitting: Interventional Cardiology

## 2023-03-26 DIAGNOSIS — R972 Elevated prostate specific antigen [PSA]: Secondary | ICD-10-CM | POA: Diagnosis not present

## 2023-03-26 DIAGNOSIS — C61 Malignant neoplasm of prostate: Secondary | ICD-10-CM | POA: Diagnosis not present

## 2023-04-07 DIAGNOSIS — M47816 Spondylosis without myelopathy or radiculopathy, lumbar region: Secondary | ICD-10-CM | POA: Diagnosis not present

## 2023-04-09 ENCOUNTER — Other Ambulatory Visit (INDEPENDENT_AMBULATORY_CARE_PROVIDER_SITE_OTHER): Payer: Self-pay | Admitting: Adult Health

## 2023-04-14 ENCOUNTER — Encounter (INDEPENDENT_AMBULATORY_CARE_PROVIDER_SITE_OTHER): Payer: Self-pay | Admitting: Adult Health

## 2023-04-14 ENCOUNTER — Ambulatory Visit (INDEPENDENT_AMBULATORY_CARE_PROVIDER_SITE_OTHER): Payer: Medicare PPO | Admitting: Adult Health

## 2023-04-14 VITALS — BP 106/71 | HR 76 | Temp 97.6°F | Ht 67.0 in | Wt 166.0 lb

## 2023-04-14 DIAGNOSIS — I152 Hypertension secondary to endocrine disorders: Secondary | ICD-10-CM | POA: Diagnosis not present

## 2023-04-14 DIAGNOSIS — E669 Obesity, unspecified: Secondary | ICD-10-CM

## 2023-04-14 DIAGNOSIS — E559 Vitamin D deficiency, unspecified: Secondary | ICD-10-CM | POA: Diagnosis not present

## 2023-04-14 DIAGNOSIS — Z6825 Body mass index (BMI) 25.0-25.9, adult: Secondary | ICD-10-CM

## 2023-04-14 DIAGNOSIS — Z7985 Long-term (current) use of injectable non-insulin antidiabetic drugs: Secondary | ICD-10-CM | POA: Diagnosis not present

## 2023-04-14 DIAGNOSIS — E785 Hyperlipidemia, unspecified: Secondary | ICD-10-CM

## 2023-04-14 DIAGNOSIS — E1159 Type 2 diabetes mellitus with other circulatory complications: Secondary | ICD-10-CM

## 2023-04-14 DIAGNOSIS — E1169 Type 2 diabetes mellitus with other specified complication: Secondary | ICD-10-CM

## 2023-04-14 MED ORDER — SEMAGLUTIDE (1 MG/DOSE) 4 MG/3ML ~~LOC~~ SOPN
1.0000 mg | PEN_INJECTOR | SUBCUTANEOUS | 0 refills | Status: DC
Start: 2023-04-14 — End: 2023-07-28

## 2023-04-14 NOTE — Progress Notes (Signed)
WEIGHT SUMMARY AND BIOMETRICS  Vitals Temp: 97.6 F (36.4 C) BP: 106/71 Pulse Rate: 76 SpO2: 98 %   Anthropometric Measurements Height: 5\' 7"  (1.702 m) Weight: 166 lb (75.3 kg) BMI (Calculated): 25.99 Weight at Last Visit: 166lb Weight Lost Since Last Visit: 0 Weight Gained Since Last Visit: 0 Starting Weight: 206lb Total Weight Loss (lbs): 40 lb (18.1 kg)   Body Composition  Body Fat %: 27.5 % Fat Mass (lbs): 45.8 lbs Muscle Mass (lbs): 114.8 lbs Total Body Water (lbs): 83.4 lbs Visceral Fat Rating : 15   Other Clinical Data Fasting: yes Labs: yes Today's Visit #: 30 Starting Date: 11/22/20    Chief Complaint:   OBESITY Bobby Munoz is here to discuss his progress with his obesity treatment plan. He is on the the Category 3 Plan and states he is following his eating plan approximately 75 % of the time. He states he is exercising Walking 60 minutes 5 times per week.   Interim History:   Reviewed [employer] results with pt: Muscle Mass: + 1 lb Adipose Mass: - 1.2 lbs  Bobby Munoz underwent prostate bx early July.  Per pt, results were + for cancer.  He has f/u with [facility] 04/23/2023  Subjective:   1. Hyperlipidemia associated with type 2 diabetes mellitus (HCC) Lipid Panel     Component Value Date/Time   CHOL 110 10/04/2021 0759   TRIG 136 10/04/2021 0759   HDL 39 (L) 10/04/2021 0759   CHOLHDL 2.8 10/04/2021 0759   CHOLHDL 3.4 02/15/2016 1119   VLDL 31 (H) 02/15/2016 1119   LDLCALC 47 10/04/2021 0759   LABVLDL 24 10/04/2021 0759  He denies tobacco/vape use  He denies CP with exertion  2. Type 2 diabetes mellitus with other specified complication, without long-term current use of insulin (HCC) Lab Results  Component Value Date   HGBA1C 5.7 (H) 10/02/2022   HGBA1C 5.8 (H) 06/04/2022   HGBA1C 5.7 (H) 01/31/2022   He is weekly Ozempic 1mg  Denies mass in neck, dysphagia, dyspepsia, persistent hoarseness, abdominal pain, or N/V/C   3.  Hypertension associated with diabetes (HCC) BP stable at OV He is rarely checking home BP, when he does it "runs normal". He denies sx's of hypotension. 01/15/2023 Cardiology OV- Per pt- Losartan 50mg  stopped and Amlodipine 5mg  decreased to 2.5mg - MAR reflects this, however note contradicts this.  Per MAR his is currently on:   amLODipine (NORVASC) 2.5 MG tablet   4. Vitamin D deficiency  Latest Reference Range & Units 06/04/22 09:10 10/02/22 12:06  Vitamin D, 25-Hydroxy 30.0 - 100.0 ng/mL 64.4 34.8  He is weekly Ergocalciferol- denies N/V/Muscle Weakness  Assessment/Plan:   1. Hyperlipidemia associated with type 2 diabetes mellitus (HCC) Check Labs - Comprehensive metabolic panel - Lipid panel  2. Type 2 diabetes mellitus with other specified complication, without long-term current use of insulin (HCC) Check Labs and Refill - Hemoglobin A1c - Insulin, random - Vitamin B12 - Semaglutide, 1 MG/DOSE, 4 MG/3ML SOPN; Inject 1 mg as directed once a week.  Dispense: 9 mL; Refill: 0  3. Hypertension associated with diabetes (HCC) Remain well hydrated. MAR again reviewed and verified with pt.  4. Vitamin D deficiency Check Labs - VITAMIN D 25 Hydroxy (Vit-D Deficiency, Fractures)  5. Obesity, Current BMI 25.99  Bobby Munoz is currently in the action stage of change. As such, his goal is to continue with weight loss efforts. He has agreed to the Category 3 Plan.   Exercise goals: Older  adults should follow the adult guidelines. When older adults cannot meet the adult guidelines, they should be as physically active as their abilities and conditions will allow.  Older adults should do exercises that maintain or improve balance if they are at risk of falling.  Older adults should determine their level of effort for physical activity relative to their level of fitness.   Behavioral modification strategies: increasing lean protein intake, decreasing simple carbohydrates, increasing vegetables,  increasing water intake, no skipping meals, meal planning and cooking strategies, and planning for success.  Bobby Munoz has agreed to follow-up with our clinic in 4 weeks. He was informed of the importance of frequent follow-up visits to maximize his success with intensive lifestyle modifications for his multiple health conditions.   Bobby Munoz was informed we would discuss his lab results at his next visit unless there is a critical issue that needs to be addressed sooner. Bobby Munoz agreed to keep his next visit at the agreed upon time to discuss these results.  Objective:   Blood pressure 106/71, pulse 76, temperature 97.6 F (36.4 C), height 5\' 7"  (1.702 m), weight 166 lb (75.3 kg), SpO2 98%. Body mass index is 26 kg/m.  General: Cooperative, alert, well developed, in no acute distress. HEENT: Conjunctivae and lids unremarkable. Cardiovascular: Regular rhythm.  Lungs: Normal work of breathing. Neurologic: No focal deficits.   Lab Results  Component Value Date   CREATININE 0.77 10/02/2022   BUN 17 10/02/2022   NA 144 10/02/2022   K 4.7 10/02/2022   CL 102 10/02/2022   CO2 25 10/02/2022   Lab Results  Component Value Date   ALT 20 10/02/2022   AST 14 10/02/2022   ALKPHOS 55 10/02/2022   BILITOT 0.9 10/02/2022   Lab Results  Component Value Date   HGBA1C 5.7 (H) 10/02/2022   HGBA1C 5.8 (H) 06/04/2022   HGBA1C 5.7 (H) 01/31/2022   HGBA1C 5.9 (H) 10/04/2021   HGBA1C 5.7 (H) 06/20/2021   Lab Results  Component Value Date   INSULIN 14.4 10/02/2022   INSULIN 14.5 06/04/2022   INSULIN 13.8 01/31/2022   INSULIN 13.5 10/04/2021   INSULIN 10.1 06/20/2021   Lab Results  Component Value Date   TSH 2.370 11/22/2020   Lab Results  Component Value Date   CHOL 110 10/04/2021   HDL 39 (L) 10/04/2021   LDLCALC 47 10/04/2021   TRIG 136 10/04/2021   CHOLHDL 2.8 10/04/2021   Lab Results  Component Value Date   VD25OH 34.8 10/02/2022   VD25OH 64.4 06/04/2022   VD25OH 46.3 01/31/2022    Lab Results  Component Value Date   WBC 9.8 11/22/2020   HGB 15.3 11/22/2020   HCT 46.6 11/22/2020   MCV 97 11/22/2020   PLT 282 11/22/2020   No results found for: "IRON", "TIBC", "FERRITIN"  Attestation Statements:   Reviewed by clinician on day of visit: allergies, medications, problem list, medical history, surgical history, family history, social history, and previous encounter notes.  I have reviewed the above documentation for accuracy and completeness, and I agree with the above. -  Kylie Simmonds d. Karey Suthers, NP-C

## 2023-04-21 DIAGNOSIS — M47816 Spondylosis without myelopathy or radiculopathy, lumbar region: Secondary | ICD-10-CM | POA: Diagnosis not present

## 2023-04-23 DIAGNOSIS — R3912 Poor urinary stream: Secondary | ICD-10-CM | POA: Diagnosis not present

## 2023-04-23 DIAGNOSIS — C61 Malignant neoplasm of prostate: Secondary | ICD-10-CM | POA: Diagnosis not present

## 2023-04-23 DIAGNOSIS — N403 Nodular prostate with lower urinary tract symptoms: Secondary | ICD-10-CM | POA: Diagnosis not present

## 2023-04-23 DIAGNOSIS — R972 Elevated prostate specific antigen [PSA]: Secondary | ICD-10-CM | POA: Diagnosis not present

## 2023-04-25 ENCOUNTER — Other Ambulatory Visit: Payer: Self-pay | Admitting: Interventional Cardiology

## 2023-04-25 DIAGNOSIS — M5416 Radiculopathy, lumbar region: Secondary | ICD-10-CM | POA: Diagnosis not present

## 2023-05-15 ENCOUNTER — Ambulatory Visit (INDEPENDENT_AMBULATORY_CARE_PROVIDER_SITE_OTHER): Payer: Medicare PPO | Admitting: Adult Health

## 2023-05-15 ENCOUNTER — Encounter (INDEPENDENT_AMBULATORY_CARE_PROVIDER_SITE_OTHER): Payer: Self-pay | Admitting: Adult Health

## 2023-05-15 VITALS — BP 111/71 | HR 66 | Temp 97.5°F | Ht 67.0 in | Wt 164.0 lb

## 2023-05-15 DIAGNOSIS — E1169 Type 2 diabetes mellitus with other specified complication: Secondary | ICD-10-CM

## 2023-05-15 DIAGNOSIS — E559 Vitamin D deficiency, unspecified: Secondary | ICD-10-CM | POA: Diagnosis not present

## 2023-05-15 DIAGNOSIS — E669 Obesity, unspecified: Secondary | ICD-10-CM | POA: Diagnosis not present

## 2023-05-15 DIAGNOSIS — Z7985 Long-term (current) use of injectable non-insulin antidiabetic drugs: Secondary | ICD-10-CM

## 2023-05-15 DIAGNOSIS — Z6825 Body mass index (BMI) 25.0-25.9, adult: Secondary | ICD-10-CM | POA: Diagnosis not present

## 2023-05-15 DIAGNOSIS — E785 Hyperlipidemia, unspecified: Secondary | ICD-10-CM | POA: Diagnosis not present

## 2023-05-15 MED ORDER — VITAMIN D (ERGOCALCIFEROL) 1.25 MG (50000 UNIT) PO CAPS
50000.0000 [IU] | ORAL_CAPSULE | ORAL | 0 refills | Status: DC
Start: 2023-05-15 — End: 2023-06-23

## 2023-05-15 NOTE — Progress Notes (Signed)
WEIGHT SUMMARY AND BIOMETRICS  Vitals Temp: (!) 97.5 F (36.4 C) BP: 111/71 Pulse Rate: 66 SpO2: 98 %   Anthropometric Measurements Height: 5\' 7"  (1.702 m) Weight: 164 lb (74.4 kg) BMI (Calculated): 25.68 Weight at Last Visit: 166lb Weight Lost Since Last Visit: 2lb Weight Gained Since Last Visit: 0 Starting Weight: 206lb Total Weight Loss (lbs): 42 lb (19.1 kg)   Body Composition  Body Fat %: 27.4 % Fat Mass (lbs): 45.2 lbs Muscle Mass (lbs): 113.4 lbs Total Body Water (lbs): 80 lbs Visceral Fat Rating : 14   Other Clinical Data Fasting: no Labs: no Today's Visit #: 31 Starting Date: 11/22/20    Chief Complaint:   OBESITY Bobby Munoz is here to discuss his progress with his obesity treatment plan. He is on the the Category 3 Plan and states he is following his eating plan approximately 75 % of the time. He states he is exercising Brisk Walking 60+ minutes 5 times per week.   Interim History:  This is the first year that [name] is under Medicare coverage When he picked up his last refill of Ozempic 1mg  once weekly injection- is >$800 for a 3 month supply. Discussed that he is in "[facility]" for Medicare part D Due to significantly increased cost- he has been injecting GLP-1 every 2 weeks to preserve supply  Subjective:   1. Hyperlipidemia associated with type 2 diabetes mellitus [facility]) Discussed Labs Lipid Panel     Component Value Date/Time   CHOL 114 04/14/2023 0811   TRIG 104 04/14/2023 0811   HDL 55 04/14/2023 0811   CHOLHDL 2.1 04/14/2023 0811   CHOLHDL 3.4 02/15/2016 1119   VLDL 31 (H) 02/15/2016 1119   LDLCALC 40 04/14/2023 0811   LABVLDL 19 04/14/2023 0811    All levels at goal! Cards manages daily Atorvastatin 80mg   2. Type 2 diabetes mellitus with other specified complication, without long-term current use of insulin (HCC) Discussed Labs  Latest Reference Range & Units 04/14/23 08:11  Glucose 70 - 99 mg/dL 098 (H)  Hemoglobin J1B  4.8 - 5.6 % 5.3  Est. average glucose Bld gHb Est-mCnc mg/dL 147  INSULIN 2.6 - 82.9 uIU/mL 48.6 (H)  (H): Data is abnormally high  Latest Reference Range & Units 10/02/22 12:06 04/14/23 08:11  Vitamin B12 232 - 1,245 pg/mL 550 486   This is the first year that [name] is under Medicare coverage When he picked up his last refill of Ozempic 1mg  once weekly injection- is >$800 for a 3 month supply. Discussed that he is in "[facility]" for Medicare part D Due to significantly increased cost- he has been injecting GLP-1 every 2 weeks to preserve supply  Denies mass in neck, dysphagia, dyspepsia, persistent hoarseness, abdominal pain, or N/V/C   3. Vitamin D deficiency Discussed Labs  Latest Reference Range & Units 10/02/22 12:06 04/14/23 08:11  Vitamin D, 25-Hydroxy 30.0 - 100.0 ng/mL 34.8 47.6   He is on weekly Ergocalciferol- denies N/V/Muscle Weakness  Assessment/Plan:   1. Hyperlipidemia associated with type 2 diabetes mellitus (HCC) Continue regular cardiovascular exercise Continue Statin therapy per Cards  2. Type 2 diabetes mellitus with other specified complication, without long-term current use of insulin (HCC) Continue Ozempic 1mg  Q14 days  3. Vitamin D deficiency Refill - Vitamin D, Ergocalciferol, (DRISDOL) 1.25 MG (50000 UNIT) CAPS capsule; Take 1 capsule (50,000 Units total) by mouth every 7 (seven) days.  Dispense: 8 capsule; Refill: 0  4. Obesity, Current  BMI 25.68  Bobby Munoz is currently in the action stage of change. As such, his goal is to continue with weight loss efforts. He has agreed to the Category 3 Plan.   Exercise goals: Older adults should follow the adult guidelines. When older adults cannot meet the adult guidelines, they should be as physically active as their abilities and conditions will allow.  Older adults should do exercises that maintain or improve balance if they are at risk of falling.  Older adults should determine their level of effort for  physical activity relative to their level of fitness.  Older adults with chronic conditions should understand whether and how their conditions affect their ability to do regular physical activity safely.  Behavioral modification strategies: increasing lean protein intake, decreasing simple carbohydrates, increasing vegetables, increasing water intake, no skipping meals, meal planning and cooking strategies, and travel eating strategies.  Muneeb has agreed to follow-up with our clinic in 4 weeks. He was informed of the importance of frequent follow-up visits to maximize his success with intensive lifestyle modifications for his multiple health conditions.   Matt was informed we would discuss his lab results at his next visit unless there is a critical issue that needs to be addressed sooner. Joni agreed to keep his next visit at the agreed upon time to discuss these results.  Objective:   Blood pressure 111/71, pulse 66, temperature (!) 97.5 F (36.4 C), height 5\' 7"  (1.702 m), weight 164 lb (74.4 kg), SpO2 98%. Body mass index is 25.69 kg/m.  General: Cooperative, alert, well developed, in no acute distress. HEENT: Conjunctivae and lids unremarkable. Cardiovascular: Regular rhythm.  Lungs: Normal work of breathing. Neurologic: No focal deficits.   Lab Results  Component Value Date   CREATININE 0.73 (L) 04/14/2023   BUN 11 04/14/2023   NA 141 04/14/2023   K 4.4 04/14/2023   CL 103 04/14/2023   CO2 24 04/14/2023   Lab Results  Component Value Date   ALT 17 04/14/2023   AST 22 04/14/2023   ALKPHOS 55 04/14/2023   BILITOT 0.5 04/14/2023   Lab Results  Component Value Date   HGBA1C 5.3 04/14/2023   HGBA1C 5.7 (H) 10/02/2022   HGBA1C 5.8 (H) 06/04/2022   HGBA1C 5.7 (H) 01/31/2022   HGBA1C 5.9 (H) 10/04/2021   Lab Results  Component Value Date   INSULIN 48.6 (H) 04/14/2023   INSULIN 14.4 10/02/2022   INSULIN 14.5 06/04/2022   INSULIN 13.8 01/31/2022   INSULIN 13.5  10/04/2021   Lab Results  Component Value Date   TSH 2.370 11/22/2020   Lab Results  Component Value Date   CHOL 114 04/14/2023   HDL 55 04/14/2023   LDLCALC 40 04/14/2023   TRIG 104 04/14/2023   CHOLHDL 2.1 04/14/2023   Lab Results  Component Value Date   VD25OH 47.6 04/14/2023   VD25OH 34.8 10/02/2022   VD25OH 64.4 06/04/2022   Lab Results  Component Value Date   WBC 9.8 11/22/2020   HGB 15.3 11/22/2020   HCT 46.6 11/22/2020   MCV 97 11/22/2020   PLT 282 11/22/2020   No results found for: "IRON", "TIBC", "FERRITIN"  Attestation Statements:   Reviewed by clinician on day of visit: allergies, medications, problem list, medical history, surgical history, family history, social history, and previous encounter notes.  I have reviewed the above documentation for accuracy and completeness, and I agree with the above. -  Yanuel Tagg d. Mehar Kirkwood, NP-C

## 2023-05-22 DIAGNOSIS — M5416 Radiculopathy, lumbar region: Secondary | ICD-10-CM | POA: Diagnosis not present

## 2023-05-26 ENCOUNTER — Telehealth: Payer: Self-pay | Admitting: Radiation Oncology

## 2023-05-26 NOTE — Telephone Encounter (Signed)
LVM to schedule CON with Dr. Kathrynn Running next available.

## 2023-05-27 ENCOUNTER — Telehealth: Payer: Self-pay | Admitting: Radiation Oncology

## 2023-05-27 DIAGNOSIS — K219 Gastro-esophageal reflux disease without esophagitis: Secondary | ICD-10-CM | POA: Diagnosis not present

## 2023-05-27 DIAGNOSIS — I2510 Atherosclerotic heart disease of native coronary artery without angina pectoris: Secondary | ICD-10-CM | POA: Diagnosis not present

## 2023-05-27 DIAGNOSIS — F5101 Primary insomnia: Secondary | ICD-10-CM | POA: Diagnosis not present

## 2023-05-27 DIAGNOSIS — F411 Generalized anxiety disorder: Secondary | ICD-10-CM | POA: Diagnosis not present

## 2023-05-27 DIAGNOSIS — R7303 Prediabetes: Secondary | ICD-10-CM | POA: Diagnosis not present

## 2023-05-27 DIAGNOSIS — M4800 Spinal stenosis, site unspecified: Secondary | ICD-10-CM | POA: Diagnosis not present

## 2023-05-27 DIAGNOSIS — E7800 Pure hypercholesterolemia, unspecified: Secondary | ICD-10-CM | POA: Diagnosis not present

## 2023-05-27 DIAGNOSIS — C61 Malignant neoplasm of prostate: Secondary | ICD-10-CM | POA: Diagnosis not present

## 2023-05-27 DIAGNOSIS — I10 Essential (primary) hypertension: Secondary | ICD-10-CM | POA: Diagnosis not present

## 2023-05-27 NOTE — Telephone Encounter (Signed)
LVM to schedule CON with Dr. Manning.  

## 2023-06-03 NOTE — Progress Notes (Signed)
GU Location of Tumor / Histology: Prostate Ca  If Prostate Cancer, Gleason Score is (3 + 4) and PSA is (5.6 on 12/2022)  PSA 0.68 on 11/26/2022  Biopsies     Past/Anticipated interventions by urology, if any:     Past/Anticipated interventions by medical oncology, if any: NA  Weight changes, if any: {:18581}  IPSS: SHIM:  Bowel/Bladder complaints, if any: {:18581}   Nausea/Vomiting, if any: {:18581}  Pain issues, if any:  {:18581}  SAFETY ISSUES: Prior radiation? {:18581} Pacemaker/ICD? {:18581} Possible current pregnancy? Male Is the patient on methotrexate?  No  Current Complaints / other details:

## 2023-06-05 ENCOUNTER — Other Ambulatory Visit: Payer: Self-pay | Admitting: Urology

## 2023-06-05 ENCOUNTER — Ambulatory Visit
Admission: RE | Admit: 2023-06-05 | Discharge: 2023-06-05 | Disposition: A | Discharge status: Home / Self Care | Payer: Medicare PPO | Source: Ambulatory Visit | Attending: Radiation Oncology | Admitting: Radiation Oncology

## 2023-06-05 ENCOUNTER — Encounter: Payer: Self-pay | Admitting: Radiation Oncology

## 2023-06-05 ENCOUNTER — Telehealth: Payer: Self-pay | Admitting: Cardiovascular Disease

## 2023-06-05 VITALS — BP 130/87 | HR 86 | Temp 97.5°F | Resp 18 | Ht 67.0 in | Wt 170.5 lb

## 2023-06-05 DIAGNOSIS — I447 Left bundle-branch block, unspecified: Secondary | ICD-10-CM | POA: Diagnosis not present

## 2023-06-05 DIAGNOSIS — M129 Arthropathy, unspecified: Secondary | ICD-10-CM | POA: Diagnosis not present

## 2023-06-05 DIAGNOSIS — Z79899 Other long term (current) drug therapy: Secondary | ICD-10-CM | POA: Insufficient documentation

## 2023-06-05 DIAGNOSIS — I340 Nonrheumatic mitral (valve) insufficiency: Secondary | ICD-10-CM | POA: Insufficient documentation

## 2023-06-05 DIAGNOSIS — C61 Malignant neoplasm of prostate: Secondary | ICD-10-CM | POA: Insufficient documentation

## 2023-06-05 DIAGNOSIS — E7800 Pure hypercholesterolemia, unspecified: Secondary | ICD-10-CM | POA: Insufficient documentation

## 2023-06-05 DIAGNOSIS — K219 Gastro-esophageal reflux disease without esophagitis: Secondary | ICD-10-CM | POA: Insufficient documentation

## 2023-06-05 DIAGNOSIS — Z191 Hormone sensitive malignancy status: Secondary | ICD-10-CM | POA: Diagnosis not present

## 2023-06-05 DIAGNOSIS — I2510 Atherosclerotic heart disease of native coronary artery without angina pectoris: Secondary | ICD-10-CM | POA: Insufficient documentation

## 2023-06-05 DIAGNOSIS — I10 Essential (primary) hypertension: Secondary | ICD-10-CM | POA: Insufficient documentation

## 2023-06-05 NOTE — Telephone Encounter (Signed)
Pre-operative Risk Assessment    Patient Name: Bobby Munoz  DOB: 27-Apr-1956 MRN: [id number]      Request for Surgical Clearance    Procedure:   Radioactive Seed Implant  Date of Surgery:  Clearance 08/19/23                                 Surgeon:  Dr. Bjorn Pippin Surgeon's Group or Practice Name:  [facility] Phone number:  [phone] Ext. [phone] Fax number:  [phone]   Type of Clearance Requested:   - Medical  - Pharmacy:  Hold Clopidogrel (Plavix) for 5 days prior to surgery   Type of Anesthesia:  General    Additional requests/questions:  Please fax a copy of medical clearance to the surgeon's office.  Mardelle Matte   06/05/2023, 3:41 PM

## 2023-06-05 NOTE — Progress Notes (Signed)
Radiation Oncology         [phone] ________________________________  Initial Outpatient Consultation  Name: Bobby Munoz MRN: [id number]  Date: 06/05/2023  DOB: 03/05/1956  WU:[id number], Misty Stanley, MD  Bjorn Pippin, MD   REFERRING PHYSICIAN: Bjorn Pippin, MD  DIAGNOSIS: 67 y.o. gentleman with Stage T2a adenocarcinoma of the prostate with Gleason score of 3+4, and PSA of 5.75.    ICD-10-CM   1. Malignant neoplasm of prostate (HCC)  C61       HISTORY OF PRESENT ILLNESS: Bobby Munoz is a 67 y.o. male with a diagnosis of prostate cancer. He was noted to have an elevated PSA of 6.68 on routine labs drawn 11/26/2022 by his primary care physician, Dr. Hyacinth Meeker.  His prior PSA was 3.35 in 2021 but a repeat PSA on 12/30/2022 remained elevated at 5.75.  Accordingly, he was referred for evaluation in urology by Dr. Annabell Howells on 01/31/2023,  digital rectal examination performed at that time showed an 8 mm right mid gland nodule.  The patient proceeded to transrectal ultrasound with 12 biopsies of the prostate on 03/26/2023.  The prostate volume measured 45 cc.  Out of 12 core biopsies, 8 were positive.  The maximum Gleason score was 3+4, and this was seen in the left base, left mid lateral and right apex lateral.  Additionally Gleason 3+3 was seen in the left apex, right base, right base lateral, right mid and right apex.  The patient reviewed the biopsy results with his urologist and he has kindly been referred today for discussion of potential radiation treatment options.   PREVIOUS RADIATION THERAPY: No  PAST MEDICAL HISTORY:  Past Medical History:  Diagnosis Date   Allergic to cats    Anxiety    Arthritis    "left shoulder" (04/20/2014)   Coronary artery disease    Food allergy    GERD (gastroesophageal reflux disease)    Hearing loss    High cholesterol    Hypertension    Joint pain    LBBB (left bundle branch block) 2010   Mild mitral regurgitation    Other fatigue    Pneumonia X 1    Prediabetes    Premature atrial contractions    PVC's (premature ventricular contractions)    Seasonal allergies    "fall and spring"   Spinal stenosis       PAST SURGICAL HISTORY: Past Surgical History:  Procedure Laterality Date   CARDIAC CATHETERIZATION  ~ 2000; 03/08/2014   CARDIAC CATHETERIZATION  04/20/2014   PCI of a chronic total occlusion in the mid right coronary artery.  There were 4 overlapping stents placed    CARDIAC CATHETERIZATION  04/20/2014   Procedure: LEFT HEART CATH;  Surgeon: Corky Crafts, MD;  Location: [facility] CATH LAB;  Service: Cardiovascular;;   CARDIOVASCULAR STRESS TEST  2011   CORONARY ANGIOPLASTY WITH STENT PLACEMENT  03/09/2014; 04/20/2014   "2 + 4"   LEFT HEART CATHETERIZATION WITH CORONARY ANGIOGRAM N/A 03/08/2014   Procedure: LEFT HEART CATHETERIZATION WITH CORONARY ANGIOGRAM;  Surgeon: Corky Crafts, MD;  Location: [facility] CATH LAB;  Service: Cardiovascular;  Laterality: N/A;   PERCUTANEOUS CORONARY STENT INTERVENTION (PCI-S) N/A 03/09/2014   Procedure: PERCUTANEOUS CORONARY STENT INTERVENTION (PCI-S);  Surgeon: Corky Crafts, MD;  Location[facility] CATH LAB;  Service: Cardiovascular;  Laterality: N/A;   PERCUTANEOUS CORONARY STENT INTERVENTION (PCI-S) N/A 04/20/2014   Procedure: PERCUTANEOUS CORONARY STENT INTERVENTION (PCI-S);  Surgeon: Corky Crafts, MD;  Location: [facility] CATH LAB;  Service:  Cardiovascular;  Laterality: N/A;   TONSILLECTOMY      FAMILY HISTORY:  Family History  Problem Relation Age of Onset   Heart failure Father    Heart attack Father    Hypertension Father    Hyperlipidemia Father    Heart disease Father    Sudden death Father    Stroke Father    Hypertension Mother    Cancer Mother    Healthy Sister    Healthy Sister     SOCIAL HISTORY:  Social History   Socioeconomic History   Marital status: Married    Spouse name: [name]   Number of children: Not on file   Years of education: Not on file   Highest education  level: Not on file  Occupational History   Occupation: Set designer  Tobacco Use   Smoking status: Never   Smokeless tobacco: Never  Vaping Use   Vaping status: Never Used  Substance and Sexual Activity   Alcohol use: Yes    Comment: 04/20/2014 "used to have couple glasses of wine/wk; none since 02/2014"   Drug use: No   Sexual activity: Yes    Partners: Female  Other Topics Concern   Not on file  Social History Narrative   Not on file   Social Determinants of Health   Financial Resource Strain: Not on file  Food Insecurity: Not on file  Transportation Needs: Not on file  Physical Activity: Not on file  Stress: Not on file  Social Connections: Not on file  Intimate Partner Violence: Not on file    ALLERGIES: Shellfish allergy, Shellfish-derived products, and Latex  MEDICATIONS:  Current Outpatient Medications  Medication Sig Dispense Refill   acetaminophen (TYLENOL) 500 MG tablet Take 500 mg by mouth every 8 (eight) hours as needed. Take two tablets by mouth up to 3 times daily as needed.     amLODipine (NORVASC) 10 MG tablet TAKE 1 TABLET BY MOUTH EVERY DAY 90 tablet 3   amLODipine (NORVASC) 2.5 MG tablet Take 1 tablet (2.5 mg total) by mouth daily. 90 tablet 3   atorvastatin (LIPITOR) 80 MG tablet TAKE 1 TABLET(80 MG) BY MOUTH DAILY 90 tablet 3   citalopram (CELEXA) 40 MG tablet Take 40 mg by mouth daily.     clopidogrel (PLAVIX) 75 MG tablet TAKE 1 TABLET BY MOUTH EVERY DAY 90 tablet 3   ezetimibe (ZETIA) 10 MG tablet TAKE 1 TABLET(10 MG) BY MOUTH DAILY 90 tablet 3   gabapentin (NEURONTIN) 300 MG capsule Take 300 mg by mouth. Take 3 tablets (900mg  total) by mouth as needed.     nitroGLYCERIN (NITROSTAT) 0.4 MG SL tablet Place 1 tablet (0.4 mg total) under the tongue every 5 (five) minutes as needed for chest pain (X3 DOSES BEFORE CALLING 911). 25 tablet 3   pantoprazole (PROTONIX) 40 MG tablet TAKE 1 TABLET(40 MG) BY MOUTH DAILY 30 tablet 1   Semaglutide, 1  MG/DOSE, 4 MG/3ML SOPN Inject 1 mg as directed once a week. 9 mL 0   tiZANidine (ZANAFLEX) 4 MG tablet Take 4 mg by mouth at bedtime.     traMADol (ULTRAM) 50 MG tablet Take 50 mg by mouth as needed.     traZODone (DESYREL) 100 MG tablet Take 100 mg by mouth at bedtime.     Vitamin D, Ergocalciferol, (DRISDOL) 1.25 MG (50000 UNIT) CAPS capsule Take 1 capsule (50,000 Units total) by mouth every 7 (seven) days. 8 capsule 0   No current facility-administered medications for this  visit.    REVIEW OF SYSTEMS:  On review of systems, the patient reports that he is doing well overall. He denies any chest pain, shortness of breath, cough, fevers, chills, night sweats, unintended weight changes. He denies any bowel disturbances, and denies abdominal pain, nausea or vomiting. He denies any new musculoskeletal or joint aches or pains. His IPSS was 7, indicating mild urinary symptoms. His SHIM was 24, indicating he does not have erectile dysfunction. A complete review of systems is obtained and is otherwise negative.    PHYSICAL EXAM:  Wt Readings from Last 3 Encounters:  05/15/23 164 lb (74.4 kg)  04/14/23 166 lb (75.3 kg)  03/06/23 166 lb (75.3 kg)   Temp Readings from Last 3 Encounters:  05/15/23 (!) 97.5 F (36.4 C)  04/14/23 97.6 F (36.4 C)  03/06/23 98.1 F (36.7 C)   BP Readings from Last 3 Encounters:  05/15/23 111/71  04/14/23 106/71  03/06/23 121/76   Pulse Readings from Last 3 Encounters:  05/15/23 66  04/14/23 76  03/06/23 83    /10  In general this is a well appearing Caucasian male in no acute distress. He's alert and oriented x4 and appropriate throughout the examination. Cardiopulmonary assessment is negative for acute distress, and he exhibits normal effort.     KPS = 100  100 - Normal; no complaints; no evidence of disease. 90   - Able to carry on normal activity; minor signs or symptoms of disease. 80   - Normal activity with effort; some signs or symptoms of  disease. 36   - Cares for self; unable to carry on normal activity or to do active work. 60   - Requires occasional assistance, but is able to care for most of his personal needs. 50   - Requires considerable assistance and frequent medical care. 40   - Disabled; requires special care and assistance. 30   - Severely disabled; hospital admission is indicated although death not imminent. 20   - Very sick; hospital admission necessary; active supportive treatment necessary. 10   - Moribund; fatal processes progressing rapidly. 0     - Dead  Karnofsky DA, Abelmann WH, Craver LS and Burchenal [facility] [phone]) The use of the nitrogen mustards in the palliative treatment of carcinoma: with particular reference to bronchogenic carcinoma Cancer 1 634-56  LABORATORY DATA:  Lab Results  Component Value Date   WBC 9.8 11/22/2020   HGB 15.3 11/22/2020   HCT 46.6 11/22/2020   MCV 97 11/22/2020   PLT 282 11/22/2020   Lab Results  Component Value Date   NA 141 04/14/2023   K 4.4 04/14/2023   CL 103 04/14/2023   CO2 24 04/14/2023   Lab Results  Component Value Date   ALT 17 04/14/2023   AST 22 04/14/2023   ALKPHOS 55 04/14/2023   BILITOT 0.5 04/14/2023     RADIOGRAPHY: No results found.    IMPRESSION/PLAN: 1. 67 y.o. gentleman with Stage T2a adenocarcinoma of the prostate with Gleason Score of 3+4, and PSA of 5.75. We discussed the patient's workup and outlined the nature of prostate cancer in this setting. The patient's T stage, Gleason's score, and PSA put him into the favorable intermediate risk group although, based on volume of disease involving 8 of 12 cores on biopsy, he is technically in the unfavorable intermediate risk group. Accordingly, he is eligible for a variety of potential treatment options including brachytherapy, 5.5 weeks of external radiation, or prostatectomy. We discussed the available  radiation techniques, and focused on the details and logistics of delivery. We discussed and  outlined the risks, benefits, short and long-term effects associated with radiotherapy and compared and contrasted these with prostatectomy. We discussed the role of SpaceOAR gel in reducing the rectal toxicity associated with radiotherapy.  He appears to have a good understanding of his disease and our treatment recommendations which are of curative intent.  He was encouraged to ask questions that were answered to his stated satisfaction.  At the conclusion of our conversation, the patient is interested in moving forward with brachytherapy and use of SpaceOAR gel to reduce rectal toxicity from radiotherapy.  We will share our discussion with Dr. Annabell Howells and move forward with scheduling his [facility] planning appointment in the near future.  The patient met briefly with Darryl Nestle in our office who will be working closely with him to coordinate OR scheduling and pre and post procedure appointments, working around a wedding for their daughter that is coming up in mid-late October 2024.  We will contact the pharmaceutical rep to ensure that SpaceOAR is available at the time of procedure.  We enjoyed meeting him and his wife today and look forward to continuing to participate in his care.  We personally spent 60 minutes in this encounter including chart review, reviewing radiological studies, meeting face-to-face with the patient, entering orders and completing documentation.    Marguarite Arbour, PA-C    Margaretmary Dys, MD  [facility]  Radiation Oncology Direct Dial: [phone]  Fax: [phone] [email or web address]  Skype  LinkedIn

## 2023-06-06 ENCOUNTER — Telehealth: Payer: Self-pay | Admitting: *Deleted

## 2023-06-06 DIAGNOSIS — C61 Malignant neoplasm of prostate: Secondary | ICD-10-CM | POA: Diagnosis not present

## 2023-06-06 DIAGNOSIS — Z191 Hormone sensitive malignancy status: Secondary | ICD-10-CM | POA: Diagnosis not present

## 2023-06-06 NOTE — Telephone Encounter (Signed)
Name: Bobby Munoz  DOB: 1956/02/28  MRN: [id number]  Primary Cardiologist: Lance Muss, MD  Chart reviewed as part of pre-operative protocol coverage. Because of Bobby Munoz's past medical history and time since last visit, he will require a follow-up in-office visit in order to better assess preoperative cardiovascular risk.  Patient has an office visit scheduled on 07/15/2023 with Perlie Gold, PA-C. Appointment notes have been updated to reflect need for pre-op evaluation.   Pre-op covering staff:  - Please contact requesting surgeon's office via preferred method (i.e, phone, fax) to inform them of need for appointment prior to surgery.  Bobby Levering, NP  06/06/2023, 3:16 PM

## 2023-06-06 NOTE — Telephone Encounter (Signed)
Called patient to inform of pre-seed appts. and implant date, lvm for a return call

## 2023-06-09 NOTE — Progress Notes (Signed)
Introduced myself to the patient as the prostate nurse navigator.  No barriers to care identified at this time.  He is here to discuss his radiation treatment options.  I gave him my business card and asked him to call me with questions or concerns.  Verbalized understanding.  ?

## 2023-06-17 DIAGNOSIS — D485 Neoplasm of uncertain behavior of skin: Secondary | ICD-10-CM | POA: Diagnosis not present

## 2023-06-17 DIAGNOSIS — Z1283 Encounter for screening for malignant neoplasm of skin: Secondary | ICD-10-CM | POA: Diagnosis not present

## 2023-06-17 DIAGNOSIS — D225 Melanocytic nevi of trunk: Secondary | ICD-10-CM | POA: Diagnosis not present

## 2023-06-17 DIAGNOSIS — D2262 Melanocytic nevi of left upper limb, including shoulder: Secondary | ICD-10-CM | POA: Diagnosis not present

## 2023-06-18 ENCOUNTER — Telehealth: Payer: Self-pay | Admitting: *Deleted

## 2023-06-18 NOTE — Telephone Encounter (Signed)
RETURNED PATIENT'S PHONE CALL, LVM FOR A RETURN CALL 

## 2023-06-23 ENCOUNTER — Ambulatory Visit (INDEPENDENT_AMBULATORY_CARE_PROVIDER_SITE_OTHER): Payer: Medicare PPO | Admitting: Adult Health

## 2023-06-23 ENCOUNTER — Encounter (INDEPENDENT_AMBULATORY_CARE_PROVIDER_SITE_OTHER): Payer: Self-pay | Admitting: Adult Health

## 2023-06-23 VITALS — BP 116/75 | HR 62 | Temp 97.7°F | Ht 67.0 in | Wt 170.0 lb

## 2023-06-23 DIAGNOSIS — E669 Obesity, unspecified: Secondary | ICD-10-CM | POA: Diagnosis not present

## 2023-06-23 DIAGNOSIS — E1169 Type 2 diabetes mellitus with other specified complication: Secondary | ICD-10-CM | POA: Diagnosis not present

## 2023-06-23 DIAGNOSIS — R0602 Shortness of breath: Secondary | ICD-10-CM

## 2023-06-23 DIAGNOSIS — Z7985 Long-term (current) use of injectable non-insulin antidiabetic drugs: Secondary | ICD-10-CM | POA: Diagnosis not present

## 2023-06-23 DIAGNOSIS — E66811 Obesity, class 1: Secondary | ICD-10-CM

## 2023-06-23 DIAGNOSIS — E559 Vitamin D deficiency, unspecified: Secondary | ICD-10-CM

## 2023-06-23 DIAGNOSIS — E785 Hyperlipidemia, unspecified: Secondary | ICD-10-CM | POA: Diagnosis not present

## 2023-06-23 DIAGNOSIS — C61 Malignant neoplasm of prostate: Secondary | ICD-10-CM

## 2023-06-23 DIAGNOSIS — Z6826 Body mass index (BMI) 26.0-26.9, adult: Secondary | ICD-10-CM | POA: Diagnosis not present

## 2023-06-23 MED ORDER — VITAMIN D (ERGOCALCIFEROL) 1.25 MG (50000 UNIT) PO CAPS: 50000.0000 [IU] | ORAL_CAPSULE | ORAL | 0 refills | Status: DC

## 2023-06-23 NOTE — Progress Notes (Signed)
WEIGHT SUMMARY AND BIOMETRICS  Vitals Temp: 97.7 F (36.5 C) BP: 116/75 Pulse Rate: 62 SpO2: 97 %   Anthropometric Measurements Height: 5\' 7"  (1.702 m) Weight: 170 lb (77.1 kg) BMI (Calculated): 26.62 Weight at Last Visit: 164lb Weight Lost Since Last Visit: 0 Weight Gained Since Last Visit: 6lb Starting Weight: 206lb Total Weight Loss (lbs): 36 lb (16.3 kg)   Body Composition  Body Fat %: 28.8 % Fat Mass (lbs): 49 lbs Muscle Mass (lbs): 115.2 lbs Total Body Water (lbs): 83.2 lbs Visceral Fat Rating : 15   Other Clinical Data RMR: 1426 Fasting: yes Labs: yes Today's Visit #: 32 Starting Date: 11/22/20    Chief Complaint:   OBESITY Bobby Munoz is here to discuss his progress with his obesity treatment plan. He is on the the Category 3 Plan and states he is following his eating plan approximately 65 % of the time. He states he is exercising Walking 60 minutes 5 times per week.   Interim History:  Bobby Munoz was recently dx'd with Prostate Ca- will undergo Seed Implantation on 08/19/2023  He has remained active with walking and eating a healthy diet.  His daughter's wedding will commence in 2 week!  He denies GI/GU sx's at present.  Subjective:   1. Malignant neoplasm of prostate [facility]) Recently diagnosed by [facility] with prostate cancer about 2 months ago.   He is planning on getting a radiation seed placed for the prostate cancer in December and is awaiting to see Oncology and have a PET scan done.    2. SOB (shortness of breath) on exertion He endorses dyspnea with extreme exertion, denies CP   11/22/20 07:00  RMR 2364  Waist Measurement  45 inches  Metabolism faster than expected   03/06/22 07:00  RMR 1368  Metabolism decreased and now slower than expected   06/23/23 09:00  RMR 1426  Metabolism increased by 58 calories, however is still flower than expected.  He is currently on prescribed Cat 3 Meal plan  3. Hyperlipidemia associated  with type 2 diabetes mellitus (HCC) He is on daily Lipitor 80mg  and Zetia 10mg  He denies myalgias   4. Type 2 diabetes mellitus with other specified complication, without long-term current use of insulin (HCC) He is using Ozempic 1mg  every 14 days to preserve supply until new insurance cycle begin Jan 2025 Denies mass in neck, dysphagia, dyspepsia, persistent hoarseness, abdominal pain, or N/V/C   5. Vitamin D deficiency  Latest Reference Range & Units 10/02/22 12:06 04/14/23 08:11  Vitamin D, 25-Hydroxy 30.0 - 100.0 ng/mL 34.8 47.6   He is on weekly Ergocalciferol- denies N/V/Muscle Weakness  Assessment/Plan:   1. Malignant neoplasm of prostate (HCC) Continue to surround self with positive friends and family. F/u with Oncology and Urology as directed  2. SOB (shortness of breath) on exertion To maintain current weight, continue Cat 3 meal plan  3. Hyperlipidemia associated with type 2 diabetes mellitus (HCC) Continue statin therapy per Cards  4. Type 2 diabetes mellitus with other specified complication, without long-term current use of insulin (HCC) Check Labs - CMP14+EGFR  5. Vitamin D deficiency Check Labs and Refill - Vitamin D, Ergocalciferol, (DRISDOL) 1.25 MG (50000 UNIT) CAPS capsule; Take 1 capsule (50,000 Units total) by mouth every 7 (seven) days.  Dispense: 8 capsule; Refill: 0 - VITAMIN D 25 Hydroxy (Vit-D Deficiency, Fractures)  6. Obesity, current BMI 26.62  Bobby Munoz is currently in the action stage of change. As such, his goal  is to maintain weight for now. He has agreed to the Category 3 Plan.   Exercise goals: Older adults should follow the adult guidelines. When older adults cannot meet the adult guidelines, they should be as physically active as their abilities and conditions will allow.  Older adults should do exercises that maintain or improve balance if they are at risk of falling.  Older adults should determine their level of effort for physical activity  relative to their level of fitness.  Older adults with chronic conditions should understand whether and how their conditions affect their ability to do regular physical activity safely.  Behavioral modification strategies: increasing lean protein intake, decreasing simple carbohydrates, increasing vegetables, increasing water intake, no skipping meals, meal planning and cooking strategies, ways to avoid boredom eating, and planning for success.  Bobby Munoz has agreed to follow-up with our clinic in 6 weeks. He was informed of the importance of frequent follow-up visits to maximize his success with intensive lifestyle modifications for his multiple health conditions.   Bobby Munoz was informed we would discuss his lab results at his next visit unless there is a critical issue that needs to be addressed sooner. Bobby Munoz agreed to keep his next visit at the agreed upon time to discuss these results.  Objective:   Blood pressure 116/75, pulse 62, temperature 97.7 F (36.5 C), height 5\' 7"  (1.702 m), weight 170 lb (77.1 kg), SpO2 97%. Body mass index is 26.63 kg/m.  General: Cooperative, alert, well developed, in no acute distress. HEENT: Conjunctivae and lids unremarkable. Cardiovascular: Regular rhythm.  Lungs: Normal work of breathing. Neurologic: No focal deficits.   Lab Results  Component Value Date   CREATININE 0.73 (L) 04/14/2023   BUN 11 04/14/2023   NA 141 04/14/2023   K 4.4 04/14/2023   CL 103 04/14/2023   CO2 24 04/14/2023   Lab Results  Component Value Date   ALT 17 04/14/2023   AST 22 04/14/2023   ALKPHOS 55 04/14/2023   BILITOT 0.5 04/14/2023   Lab Results  Component Value Date   HGBA1C 5.3 04/14/2023   HGBA1C 5.7 (H) 10/02/2022   HGBA1C 5.8 (H) 06/04/2022   HGBA1C 5.7 (H) 01/31/2022   HGBA1C 5.9 (H) 10/04/2021   Lab Results  Component Value Date   INSULIN 48.6 (H) 04/14/2023   INSULIN 14.4 10/02/2022   INSULIN 14.5 06/04/2022   INSULIN 13.8 01/31/2022   INSULIN 13.5  10/04/2021   Lab Results  Component Value Date   TSH 2.370 11/22/2020   Lab Results  Component Value Date   CHOL 114 04/14/2023   HDL 55 04/14/2023   LDLCALC 40 04/14/2023   TRIG 104 04/14/2023   CHOLHDL 2.1 04/14/2023   Lab Results  Component Value Date   VD25OH 47.6 04/14/2023   VD25OH 34.8 10/02/2022   VD25OH 64.4 06/04/2022   Lab Results  Component Value Date   WBC 9.8 11/22/2020   HGB 15.3 11/22/2020   HCT 46.6 11/22/2020   MCV 97 11/22/2020   PLT 282 11/22/2020   No results found for: "IRON", "TIBC", "FERRITIN"  Attestation Statements:   Reviewed by clinician on day of visit: allergies, medications, problem list, medical history, surgical history, family history, social history, and previous encounter notes.  I have reviewed the above documentation for accuracy and completeness, and I agree with the above. -  Esley Brooking d. Jaquel Coomer, NP-C

## 2023-06-24 LAB — CMP14+EGFR
ALT: 21 [IU]/L (ref 0–44)
AST: 22 [IU]/L (ref 0–40)
Albumin: 4.2 g/dL (ref 3.9–4.9)
Alkaline Phosphatase: 62 [IU]/L (ref 44–121)
BUN/Creatinine Ratio: 11 (ref 10–24)
BUN: 11 mg/dL (ref 8–27)
Bilirubin Total: 0.7 mg/dL (ref 0.0–1.2)
CO2: 24 mmol/L (ref 20–29)
Calcium: 9.3 mg/dL (ref 8.6–10.2)
Chloride: 102 mmol/L (ref 96–106)
Creatinine, Ser: 1.01 mg/dL (ref 0.76–1.27)
Globulin, Total: 2.4 g/dL (ref 1.5–4.5)
Glucose: 96 mg/dL (ref 70–99)
Potassium: 4.6 mmol/L (ref 3.5–5.2)
Sodium: 141 mmol/L (ref 134–144)
Total Protein: 6.6 g/dL (ref 6.0–8.5)
eGFR: 82 mL/min/{1.73_m2} (ref >59)

## 2023-06-24 LAB — VITAMIN D 25 HYDROXY (VIT D DEFICIENCY, FRACTURES): Vit D, 25-Hydroxy: 43.8 ng/mL (ref 30.0–100.0)

## 2023-07-14 DIAGNOSIS — R3912 Poor urinary stream: Secondary | ICD-10-CM | POA: Diagnosis not present

## 2023-07-14 DIAGNOSIS — R8271 Bacteriuria: Secondary | ICD-10-CM | POA: Diagnosis not present

## 2023-07-14 DIAGNOSIS — C61 Malignant neoplasm of prostate: Secondary | ICD-10-CM | POA: Diagnosis not present

## 2023-07-14 DIAGNOSIS — N403 Nodular prostate with lower urinary tract symptoms: Secondary | ICD-10-CM | POA: Diagnosis not present

## 2023-07-15 ENCOUNTER — Ambulatory Visit: Payer: Medicare PPO | Attending: Cardiology | Admitting: Cardiology

## 2023-07-15 ENCOUNTER — Encounter: Payer: Self-pay | Admitting: Cardiology

## 2023-07-15 VITALS — BP 160/88 | HR 61 | Ht 67.0 in | Wt 173.2 lb

## 2023-07-15 DIAGNOSIS — E785 Hyperlipidemia, unspecified: Secondary | ICD-10-CM

## 2023-07-15 DIAGNOSIS — I2510 Atherosclerotic heart disease of native coronary artery without angina pectoris: Secondary | ICD-10-CM | POA: Diagnosis not present

## 2023-07-15 DIAGNOSIS — I340 Nonrheumatic mitral (valve) insufficiency: Secondary | ICD-10-CM

## 2023-07-15 DIAGNOSIS — I10 Essential (primary) hypertension: Secondary | ICD-10-CM

## 2023-07-15 DIAGNOSIS — Z01810 Encounter for preprocedural cardiovascular examination: Secondary | ICD-10-CM

## 2023-07-15 NOTE — Progress Notes (Addendum)
Cardiology Office Note:   Date:  07/15/2023  ID:  Bobby Munoz, Bobby Munoz [birth year], MRN [id number] PCP: Sigmund Hazel, MD  [facility] Providers Cardiologist:  None    History of Present Illness:   Discussed the use of AI scribe software for clinical note transcription with the patient, who gave verbal consent to proceed.  History of Present Illness   The patient, a 67 year old individual with a history of coronary artery disease (DESx2 to LAD in 02/2014 then CTO PCI to RCA with 4 overlapping stents in 04/2014), hypertension, hyperlipidemia, left bundle branch block, syncope in 2019, bradycardia, and mild mitral regurgitation, presented for a preoperative evaluation. He is scheduled for a prostate radioactive seed implant. The patient reported feeling well with no recent episodes of dizziness or lightheadedness, chest pain, shortness of breath, palpitations.   Earlier in the year, the patient experienced low blood pressure, which was suspected to be due to dehydration. At that time, the patient was on losartan 50 mg and amlodipine 5 mg. The dosage of amlodipine was reduced to 2.5mg  and losartan was discontinued. Notes are unclear as to when this change occurred and patient was unsure of the exact timeline of these changes as well but believed they occurred around May after he was seen with dizziness.  Since the medication adjustments, the patient reported feeling good with no recurrence of low blood pressure. He denied experiencing any chest pain or shortness of breath. The patient maintains an active lifestyle, including an hour on the treadmill five days a week and regular yard work. He regularly takes >10000 steps per day. There were no reported symptoms of shortness of breath, chest pain, or palpitations during these activities.  The patient also reported a significant weight loss of about 50 pounds over the last two years, achieved through diet and exercise. Patient reports that BP has  generally been well controlled on decreased regimen. His BP at 06/23/23 PCP OV was 116/54mmHg and patient says this is generally consistent with home checks.   In summary, the patient presented for a preoperative evaluation for an upcoming prostate radioactive seed implant. He reported feeling well with no recent cardiovascular symptoms. The patient maintains an active lifestyle and has achieved significant weight loss.     Today patient denies chest pain, shortness of breath, lower extremity edema, fatigue, palpitations, melena, hematuria, hemoptysis, diaphoresis, weakness, presyncope, syncope, orthopnea, and PND.   Studies Reviewed:    EKG:   EKG Interpretation Date/Time:  Tuesday July 15 2023 11:38:09 EDT Ventricular Rate:  61 PR Interval:  134 QRS Duration:  120 QT Interval:  436 QTC Calculation: 438 R Axis:   69  Text Interpretation: Normal sinus rhythm Left bundle branch block When compared with ECG of 28-Dec-2017 02:04, Premature ventricular complexes are no longer Present Premature supraventricular complexes are no longer Present Confirmed by Perlie Gold [phone]) on 07/15/2023 11:50:28 AM      Risk Assessment/Calculations:     HYPERTENSION CONTROL Vitals:   07/15/23 1140 07/15/23 1211  BP: (!) 154/88 (!) 160/88    The patient's blood pressure is elevated above target today.  In order to address the patient's elevated BP: Blood pressure will be monitored at home to determine if medication changes need to be made. (home BP log that patient will submit for review)           Physical Exam:   VS:  BP (!) 160/88   Pulse 61   Ht 5\' 7"  (1.702 m)   Wt  173 lb 3.2 oz (78.6 kg)   SpO2 98%   BMI 27.13 kg/m    Wt Readings from Last 3 Encounters:  07/15/23 173 lb 3.2 oz (78.6 kg)  06/23/23 170 lb (77.1 kg)  06/05/23 170 lb 8 oz (77.3 kg)     Physical Exam Vitals reviewed.  Constitutional:      Appearance: Normal appearance.  HENT:     Head: Normocephalic.  Eyes:      Pupils: Pupils are equal, round, and reactive to light.  Cardiovascular:     Rate and Rhythm: Normal rate and regular rhythm.     Pulses: Normal pulses.     Heart sounds: Murmur (faint over apex) heard.  Pulmonary:     Effort: Pulmonary effort is normal.     Breath sounds: Normal breath sounds.  Abdominal:     General: Abdomen is flat.     Palpations: Abdomen is soft.  Musculoskeletal:     Right lower leg: No edema.     Left lower leg: No edema.  Skin:    General: Skin is warm and dry.     Capillary Refill: Capillary refill takes less than 2 seconds.  Neurological:     General: No focal deficit present.     Mental Status: He is alert and oriented to person, place, and time.  Psychiatric:        Mood and Affect: Mood normal.        Behavior: Behavior normal.        Thought Content: Thought content normal.        Judgment: Judgment normal.     ASSESSMENT AND PLAN:     Assessment and Plan    Coronary Artery Disease Stable, no recent angina or exertional limitation. LDL well controlled at 40 (goal <55). ECG today with stable rate/rhythm. No acute ischemic changes. -Continue Plavix and Atorvastatin 80mg  daily, Zetia 10mg  daily.  Hypertension Moderately elevated BP today, possibly due to recent caffeine intake. Previously on Amlodipine 5mg  and Losartan, but losartan discontinued and amlodipine reduced due to hypotension and dizziness. -Check BP daily for the next 14 days and submit readings for review. If consistently elevated, consider increasing Amlodipine from 2.5mg  to 5mg  daily.  PVCs and bradycardia Patient previously with 3.1 sec pause on telemetry while on Metoprolol. Subsequent 48hr monitor showed NSR average HR 61, lowest HR 44bpm, occasional PACs/PVCs. He called in in 11/2018 with fatigue and slow HR by home checks. Event monitor 01/2019 showed NSR with frequent PVCs (9%), occasional PACs, lowest HR 47bpm, average 68bpm. ECG today without arrhythmia.  -Continue to  monitor. Avoid BB with hx bradycardia.  Mild Mitral Valve Regurgitation Noted on 2019 TTE. Faint murmur on exam today. No clinical symptoms of significant mitral valve regurgitation. -Monitor symptoms.  Preoperative Evaluation for Prostate Cancer Surgery Patient pending prostate radioactive seed implant on 08/20/23. According to the Revised Cardiac Risk Index (RCRI), his Perioperative Risk of Major Cardiac Event is (%): 0.9 His Functional Capacity in METs is: 8.97 according to the Duke Activity Status Index (DASI). Therefore, based on ACC/AHA guidelines, patient would be at acceptable risk for the planned procedure without further cardiovascular testing. Per office protocol, if patient is without any new symptoms or concerns at the time of their virtual visit, he/she may hold Plavix for up to 5 days prior to procedure. Please resume Plavix as soon as possible postprocedure, at the discretion of the surgeon.  I will route this recommendation to the requesting party via Epic fax  function.          Signed, Perlie Gold, PA-C

## 2023-07-15 NOTE — Patient Instructions (Signed)
Medication Instructions:  Your physician recommends that you continue on your current medications as directed. Please refer to the Current Medication list given to you today. *If you need a refill on your cardiac medications before your next appointment, please call your pharmacy*   Lab Work: None offered   Testing/Procedures: None ordered   Follow-Up: At [facility], you and your health needs are our priority.  As part of our continuing mission to provide you with exceptional heart care, we have created designated Provider Care Teams.  These Care Teams include your primary Cardiologist (physician) and Advanced Practice Providers (APPs -  Physician Assistants and Nurse Practitioners) who all work together to provide you with the care you need, when you need it.  We recommend signing up for the patient portal called "MyChart".  Sign up information is provided on this After Visit Summary.  MyChart is used to connect with patients for Virtual Visits (Telemedicine).  Patients are able to view lab/test results, encounter notes, upcoming appointments, etc.  Non-urgent messages can be sent to your provider as well.   To learn more about what you can do with MyChart, go to [email or web address].    Your next appointment:   12 month(s)  Provider:   Truett Mainland, MD     Other Instructions Check your blood pressure daily for 2 weeks, then contact the office with your readings.  Make sure to check 2 hours after your medications.   AVOID these things for 30 minutes before checking your blood pressure: No Drinking caffeine. No Drinking alcohol. No Eating. No Smoking. No Exercising.  Five minutes before checking your blood pressure: Pee. Sit in a dining chair. Avoid sitting in a soft couch or armchair. Be quiet. Do not talk.

## 2023-07-18 ENCOUNTER — Other Ambulatory Visit: Payer: Self-pay | Admitting: Medical Genetics

## 2023-07-18 DIAGNOSIS — Z006 Encounter for examination for normal comparison and control in clinical research program: Secondary | ICD-10-CM

## 2023-07-18 NOTE — Progress Notes (Signed)
Left message for call back to assess any needs prior to upcoming brachytherapy on 12/3.

## 2023-07-28 ENCOUNTER — Ambulatory Visit (INDEPENDENT_AMBULATORY_CARE_PROVIDER_SITE_OTHER): Payer: Medicare PPO | Admitting: Adult Health

## 2023-07-28 ENCOUNTER — Encounter (INDEPENDENT_AMBULATORY_CARE_PROVIDER_SITE_OTHER): Payer: Self-pay | Admitting: Adult Health

## 2023-07-28 VITALS — BP 139/80 | HR 55 | Temp 97.7°F | Ht 66.0 in | Wt 169.0 lb

## 2023-07-28 DIAGNOSIS — E669 Obesity, unspecified: Secondary | ICD-10-CM | POA: Diagnosis not present

## 2023-07-28 DIAGNOSIS — Z6833 Body mass index (BMI) 33.0-33.9, adult: Secondary | ICD-10-CM

## 2023-07-28 DIAGNOSIS — Z6827 Body mass index (BMI) 27.0-27.9, adult: Secondary | ICD-10-CM | POA: Diagnosis not present

## 2023-07-28 DIAGNOSIS — E66811 Obesity, class 1: Secondary | ICD-10-CM

## 2023-07-28 DIAGNOSIS — E785 Hyperlipidemia, unspecified: Secondary | ICD-10-CM

## 2023-07-28 DIAGNOSIS — E559 Vitamin D deficiency, unspecified: Secondary | ICD-10-CM | POA: Diagnosis not present

## 2023-07-28 DIAGNOSIS — C61 Malignant neoplasm of prostate: Secondary | ICD-10-CM | POA: Diagnosis not present

## 2023-07-28 DIAGNOSIS — E1169 Type 2 diabetes mellitus with other specified complication: Secondary | ICD-10-CM | POA: Diagnosis not present

## 2023-07-28 DIAGNOSIS — Z7985 Long-term (current) use of injectable non-insulin antidiabetic drugs: Secondary | ICD-10-CM

## 2023-07-28 MED ORDER — SEMAGLUTIDE (1 MG/DOSE) 4 MG/3ML ~~LOC~~ SOPN: 1.0000 mg | PEN_INJECTOR | SUBCUTANEOUS | 0 refills | Status: DC

## 2023-07-28 MED ORDER — VITAMIN D (ERGOCALCIFEROL) 1.25 MG (50000 UNIT) PO CAPS: 50000.0000 [IU] | ORAL_CAPSULE | ORAL | 0 refills | Status: DC

## 2023-07-28 NOTE — Progress Notes (Signed)
WEIGHT SUMMARY AND BIOMETRICS  No data recorded Anthropometric Measurements Height: 5\' 7"  (1.702 m) Weight at Last Visit: 170 lb Starting Weight: 206 lb   No data recorded Other Clinical Data RMR: [id number] Labs: no Today's Visit #: 33 Starting Date: 11/22/20    Chief Complaint:   OBESITY Bobby Munoz is here to discuss his progress with his obesity treatment plan. He is on the the Category 3 Plan and states he is following his eating plan approximately 70 % of the time. He states he is exercising Brisk Walking 60 minutes 5 times per week.   Interim History:  His daughter's wedding was a success! He is scheduled on 08/19/2023 RADIOACTIVE SEED IMPLANT/BRACHYTHERAPY IMPLANT N/A General  SPACE OAR INSTILLATION      He reports strong, local support system. He denies acute pain at present  Subjective:   1. Hyperlipidemia associated with type 2 diabetes mellitus (HCC) Discussed Labs 06/23/2023 CMP- Liver enzymes normal  2. Type 2 diabetes mellitus with other specified complication, without long-term current use of insulin (HCC) Discussed Labs  Latest Reference Range & Units 04/14/23 08:11 06/23/23 09:59  Glucose 70 - 99 mg/dL 366 (H) 96  Hemoglobin A1C 4.8 - 5.6 % 5.3   Est. average glucose Bld gHb Est-mCnc mg/dL 440   INSULIN 2.6 - 34.7 uIU/mL 48.6 (H)   (H): Data is abnormally high  He is on Ozempic 1mg  every 14 days Denies mass in neck, dysphagia, dyspepsia, persistent hoarseness, abdominal pain, or N/V/C   3. Vitamin D deficiency Discussed Labs  Latest Reference Range & Units 06/23/23 09:59  Vitamin D, 25-Hydroxy 30.0 - 100.0 ng/mL 43.8   Level stable, yet still below goal of 50-70 He is on weekly Ergocalciferol- denies N/V/Muscle Weakness  4. Malignant neoplasm of prostate (HCC) 08/19/2023 RADIOACTIVE SEED IMPLANT/BRACHYTHERAPY IMPLANT N/A General  SPACE OAR INSTILLATION      Assessment/Plan:   1. Hyperlipidemia associated with type 2 diabetes mellitus  (HCC) Continue daily max dose Lipitor 80mg  Continue daily walking  2. Type 2 diabetes mellitus with other specified complication, without long-term current use of insulin (HCC) Refill Semaglutide, 1 MG/DOSE, 4 MG/3ML SOPN Inject 1 mg as directed once a week. Dispense: 9 mL, Refills: 0 ordered   Continue to take Q14 days  3. Vitamin D deficiency Refill Vitamin D, Ergocalciferol, (DRISDOL) 1.25 MG (50000 UNIT) CAPS capsule Take 1 capsule (50,000 Units total) by mouth every 7 (seven) days. Dispense: 8 capsule, Refills: 0 ordered   4. Malignant neoplasm of prostate (HCC) Continue to increase protein intake daily F/u with Urology as directed  5. Obesity, current BMI 27.3  Bobby Munoz is currently in the action stage of change. As such, his goal is to maintain weight for now. He has agreed to the Category 3 Plan.   Exercise goals: No exercise has been prescribed at this time.  Behavioral modification strategies: increasing lean protein intake, decreasing simple carbohydrates, increasing vegetables, increasing water intake, no skipping meals, meal planning and cooking strategies, keeping healthy foods in the home, and planning for success.  Bobby Munoz has agreed to follow-up with our clinic in 4 weeks. He was informed of the importance of frequent follow-up visits to maximize his success with intensive lifestyle modifications for his multiple health conditions.   Objective:   Height 5\' 7"  (1.702 m). Body mass index is 27.13 kg/m.  General: Cooperative, alert, well developed, in no acute distress. HEENT: Conjunctivae and lids unremarkable. Cardiovascular: Regular rhythm.  Lungs: Normal work of breathing. Neurologic:  No focal deficits.   Lab Results  Component Value Date   CREATININE 1.01 06/23/2023   BUN 11 06/23/2023   NA 141 06/23/2023   K 4.6 06/23/2023   CL 102 06/23/2023   CO2 24 06/23/2023   Lab Results  Component Value Date   ALT 21 06/23/2023   AST 22 06/23/2023   ALKPHOS 62  06/23/2023   BILITOT 0.7 06/23/2023   Lab Results  Component Value Date   HGBA1C 5.3 04/14/2023   HGBA1C 5.7 (H) 10/02/2022   HGBA1C 5.8 (H) 06/04/2022   HGBA1C 5.7 (H) 01/31/2022   HGBA1C 5.9 (H) 10/04/2021   Lab Results  Component Value Date   INSULIN 48.6 (H) 04/14/2023   INSULIN 14.4 10/02/2022   INSULIN 14.5 06/04/2022   INSULIN 13.8 01/31/2022   INSULIN 13.5 10/04/2021   Lab Results  Component Value Date   TSH 2.370 11/22/2020   Lab Results  Component Value Date   CHOL 114 04/14/2023   HDL 55 04/14/2023   LDLCALC 40 04/14/2023   TRIG 104 04/14/2023   CHOLHDL 2.1 04/14/2023   Lab Results  Component Value Date   VD25OH 43.8 06/23/2023   VD25OH 47.6 04/14/2023   VD25OH 34.8 10/02/2022   Lab Results  Component Value Date   WBC 9.8 11/22/2020   HGB 15.3 11/22/2020   HCT 46.6 11/22/2020   MCV 97 11/22/2020   PLT 282 11/22/2020   No results found for: "IRON", "TIBC", "FERRITIN"  Attestation Statements:   Reviewed by clinician on day of visit: allergies, medications, problem list, medical history, surgical history, family history, social history, and previous encounter notes.  I have reviewed the above documentation for accuracy and completeness, and I agree with the above. -  Hanh Kertesz d. Bobby Jesus, NP-C

## 2023-07-29 ENCOUNTER — Telehealth: Payer: Self-pay | Admitting: *Deleted

## 2023-07-29 ENCOUNTER — Encounter: Payer: Self-pay | Admitting: Radiation Oncology

## 2023-07-29 NOTE — Telephone Encounter (Signed)
Called patient to remind of pre-seed appts. for 07-31-23, lvm for a return call

## 2023-07-30 ENCOUNTER — Telehealth: Payer: Self-pay | Admitting: *Deleted

## 2023-07-30 NOTE — Progress Notes (Signed)
Radiation Oncology         [phone]) [phone] ________________________________  Outpatient Follow up- Pre-seed visit  Name: Bobby Munoz MRN: [id number]  Date: 07/31/2023  DOB: 08/11/1956  NW:GNFAOZ, Misty Stanley, MD  Bjorn Pippin, MD   REFERRING PHYSICIAN: Bjorn Pippin, MD  DIAGNOSIS: 67 y.o. gentleman with Stage T2a adenocarcinoma of the prostate with Gleason score of 3+4, and PSA of 5.75.     ICD-10-CM   1. Malignant neoplasm of prostate (HCC)  C61       HISTORY OF PRESENT ILLNESS: Bobby Munoz is a 67 y.o. male with a diagnosis of prostate cancer. He was noted to have an elevated PSA of 6.68 on routine labs drawn 11/26/2022 by his primary care physician, Dr. Hyacinth Meeker.  His prior PSA was 3.35 in 2021 but a repeat PSA on 12/30/2022 remained elevated at 5.75.  Accordingly, he was referred for evaluation in urology by Dr. Annabell Howells on 01/31/2023,  digital rectal examination performed at that time showed an 8 mm right mid gland nodule.  The patient proceeded to transrectal ultrasound with 12 biopsies of the prostate on 03/26/2023.  The prostate volume measured 45 cc.  Out of 12 core biopsies, 8 were positive.  The maximum Gleason score was 3+4, and this was seen in the left base, left mid lateral and right apex lateral.  Additionally Gleason 3+3 was seen in the left apex, right base, right base lateral, right mid and right apex.   The patient reviewed the biopsy results with his urologist and was kindly referred to [place] for discussion of potential radiation treatment options. We initially met the patient on 06/05/23 and he was most interested in proceeding with brachytherapy and SpaceOAR gel placement for treatment of his disease. He is here today for his pre-procedure imaging for planning and to answer any additional questions he may have about this treatment.   PREVIOUS RADIATION THERAPY: No  PAST MEDICAL HISTORY:  Past Medical History:  Diagnosis Date   Allergic to cats    Anxiety    Arthritis    "left  shoulder" (04/20/2014)   Coronary artery disease    Food allergy    GERD (gastroesophageal reflux disease)    Hearing loss    High cholesterol    Hypertension    Joint pain    LBBB (left bundle branch block) 2010   Mild mitral regurgitation    Other fatigue    Pneumonia X 1   Prediabetes    Premature atrial contractions    PVC's (premature ventricular contractions)    Seasonal allergies    "fall and spring"   Spinal stenosis       PAST SURGICAL HISTORY: Past Surgical History:  Procedure Laterality Date   CARDIAC CATHETERIZATION  ~ 2000; 03/08/2014   CARDIAC CATHETERIZATION  04/20/2014   PCI of a chronic total occlusion in the mid right coronary artery.  There were 4 overlapping stents placed    CARDIAC CATHETERIZATION  04/20/2014   Procedure: LEFT HEART CATH;  Surgeon: Corky Crafts, MD;  Location: [facility] CATH LAB;  Service: Cardiovascular;;   CARDIOVASCULAR STRESS TEST  09/16/2009   CORONARY ANGIOPLASTY WITH STENT PLACEMENT  03/09/2014; 04/20/2014   "2 + 4"   LEFT HEART CATHETERIZATION WITH CORONARY ANGIOGRAM N/A 03/08/2014   Procedure: LEFT HEART CATHETERIZATION WITH CORONARY ANGIOGRAM;  Surgeon: Corky Crafts, MD;  Location: [facility] CATH LAB;  Service: Cardiovascular;  Laterality: N/A;   PERCUTANEOUS CORONARY STENT INTERVENTION (PCI-S) N/A 03/09/2014   Procedure: PERCUTANEOUS CORONARY  STENT INTERVENTION (PCI-S);  Surgeon: Corky Crafts, MD;  Location: [facility] CATH LAB;  Service: Cardiovascular;  Laterality: N/A;   PERCUTANEOUS CORONARY STENT INTERVENTION (PCI-S) N/A 04/20/2014   Procedure: PERCUTANEOUS CORONARY STENT INTERVENTION (PCI-S);  Surgeon: Corky Crafts, MD;  Location: [facility] CATH LAB;  Service: Cardiovascular;  Laterality: N/A;   PROSTATE BIOPSY     TONSILLECTOMY      FAMILY HISTORY:  Family History  Problem Relation Age of Onset   Heart failure Father    Heart attack Father    Hypertension Father    Hyperlipidemia Father    Heart disease Father    Sudden  death Father    Stroke Father    Hypertension Mother    Cancer Mother    Healthy Sister    Healthy Sister     SOCIAL HISTORY:  Social History   Socioeconomic History   Marital status: Married    Spouse name: [name]   Number of children: Not on file   Years of education: Not on file   Highest education level: Not on file  Occupational History   Occupation: Set designer  Tobacco Use   Smoking status: Never   Smokeless tobacco: Never  Vaping Use   Vaping status: Never Used  Substance and Sexual Activity   Alcohol use: Yes   Drug use: No   Sexual activity: Yes    Partners: Female  Other Topics Concern   Not on file  Social History Narrative   Not on file   Social Determinants of Health   Financial Resource Strain: Not on file  Food Insecurity: No Food Insecurity (06/05/2023)   Hunger Vital Sign    Worried About Running Out of Food in the Last Year: Never true    Ran Out of Food in the Last Year: Never true  Transportation Needs: No Transportation Needs (06/05/2023)   PRAPARE - Administrator, Civil Service (Medical): No    Lack of Transportation (Non-Medical): No  Physical Activity: Not on file  Stress: Not on file  Social Connections: Not on file  Intimate Partner Violence: Not At Risk (06/05/2023)   Humiliation, Afraid, Rape, and Kick questionnaire    Fear of Current or Ex-Partner: No    Emotionally Abused: No    Physically Abused: No    Sexually Abused: No    ALLERGIES: Shellfish allergy, Shellfish-derived products, and Latex  MEDICATIONS:  Current Outpatient Medications  Medication Sig Dispense Refill   acetaminophen (TYLENOL) 500 MG tablet Take 500 mg by mouth every 8 (eight) hours as needed. Take two tablets by mouth up to 3 times daily as needed.     amLODipine (NORVASC) 2.5 MG tablet Take 1 tablet (2.5 mg total) by mouth daily. 90 tablet 3   atorvastatin (LIPITOR) 80 MG tablet TAKE 1 TABLET(80 MG) BY MOUTH DAILY 90 tablet 3    citalopram (CELEXA) 40 MG tablet Take 40 mg by mouth daily.     clopidogrel (PLAVIX) 75 MG tablet TAKE 1 TABLET BY MOUTH EVERY DAY 90 tablet 3   ezetimibe (ZETIA) 10 MG tablet TAKE 1 TABLET(10 MG) BY MOUTH DAILY 90 tablet 3   gabapentin (NEURONTIN) 300 MG capsule 300 mg 2 (two) times daily. Takes 2 tablets BID.     nitroGLYCERIN (NITROSTAT) 0.4 MG SL tablet Place 1 tablet (0.4 mg total) under the tongue every 5 (five) minutes as needed for chest pain (X3 DOSES BEFORE CALLING 911). 25 tablet 3   pantoprazole (PROTONIX) 40 MG tablet  TAKE 1 TABLET(40 MG) BY MOUTH DAILY 30 tablet 1   Semaglutide, 1 MG/DOSE, 4 MG/3ML SOPN Inject 1 mg as directed once a week. 9 mL 0   tiZANidine (ZANAFLEX) 4 MG tablet Take 4 mg by mouth at bedtime.     traMADol (ULTRAM) 50 MG tablet Take 50 mg by mouth as needed.     traZODone (DESYREL) 100 MG tablet Take 100 mg by mouth at bedtime.     Vitamin D, Ergocalciferol, (DRISDOL) 1.25 MG (50000 UNIT) CAPS capsule Take 1 capsule (50,000 Units total) by mouth every 7 (seven) days. 8 capsule 0   No current facility-administered medications for this visit.    REVIEW OF SYSTEMS:  On review of systems, the patient reports that he is doing well overall. He denies any chest pain, shortness of breath, cough, fevers, chills, night sweats, unintended weight changes. He denies any bowel disturbances, and denies abdominal pain, nausea or vomiting. He denies any new musculoskeletal or joint aches or pains. His IPSS was 7, indicating mild urinary symptoms. His SHIM was 24, indicating he does not have erectile dysfunction. A complete review of systems is obtained and is otherwise negative.     PHYSICAL EXAM:  Wt Readings from Last 3 Encounters:  07/28/23 169 lb (76.7 kg)  07/15/23 173 lb 3.2 oz (78.6 kg)  06/23/23 170 lb (77.1 kg)   Temp Readings from Last 3 Encounters:  07/28/23 97.7 F (36.5 C)  06/23/23 97.7 F (36.5 C)  06/05/23 (!) 97.5 F (36.4 C) (Temporal)   BP Readings  from Last 3 Encounters:  07/28/23 139/80  07/15/23 (!) 160/88  06/23/23 116/75   Pulse Readings from Last 3 Encounters:  07/28/23 (!) 55  07/15/23 61  06/23/23 62    /10  In general this is a well appearing Caucasian male in no acute distress. He's alert and oriented x4 and appropriate throughout the examination. Cardiopulmonary assessment is negative for acute distress, and he exhibits normal effort.     KPS = 100  100 - Normal; no complaints; no evidence of disease. 90   - Able to carry on normal activity; minor signs or symptoms of disease. 80   - Normal activity with effort; some signs or symptoms of disease. 1   - Cares for self; unable to carry on normal activity or to do active work. 60   - Requires occasional assistance, but is able to care for most of his personal needs. 50   - Requires considerable assistance and frequent medical care. 40   - Disabled; requires special care and assistance. 30   - Severely disabled; hospital admission is indicated although death not imminent. 20   - Very sick; hospital admission necessary; active supportive treatment necessary. 10   - Moribund; fatal processes progressing rapidly. 0     - Dead  Karnofsky DA, Abelmann WH, Craver LS and Burchenal [facility] [phone]) The use of the nitrogen mustards in the palliative treatment of carcinoma: with particular reference to bronchogenic carcinoma Cancer 1 634-56  LABORATORY DATA:  Lab Results  Component Value Date   WBC 9.8 11/22/2020   HGB 15.3 11/22/2020   HCT 46.6 11/22/2020   MCV 97 11/22/2020   PLT 282 11/22/2020   Lab Results  Component Value Date   NA 141 06/23/2023   K 4.6 06/23/2023   CL 102 06/23/2023   CO2 24 06/23/2023   Lab Results  Component Value Date   ALT 21 06/23/2023   AST 22 06/23/2023  ALKPHOS 62 06/23/2023   BILITOT 0.7 06/23/2023     RADIOGRAPHY: No results found.    IMPRESSION/PLAN: 1. 67 y.o. gentleman with Stage T2a adenocarcinoma of the prostate with  Gleason score of 3+4, and PSA of 5.75.  The patient has elected to proceed with seed implant for treatment of his disease. We reviewed the risks, benefits, short and long-term effects associated with brachytherapy and discussed the role of SpaceOAR in reducing the rectal toxicity associated with radiotherapy.  He appears to have a good understanding of his disease and our treatment recommendations which are of curative intent.  He was encouraged to ask questions that were answered to his stated satisfaction. He has freely signed written consent to proceed today in the office and a copy of this document will be placed in his medical record. His procedure is tentatively scheduled for 08/19/23 in collaboration with Dr. Annabell Howells and we will see him back for his post-procedure visit approximately 3 weeks thereafter. We look forward to continuing to participate in his care. He knows that he is welcome to call with any questions or concerns at any time in the interim.  I personally spent 30 minutes in this encounter including chart review, reviewing radiological studies, meeting face-to-face with the patient, entering orders and completing documentation.    Marguarite Arbour, MMS, PA-C [place]  [facility] Radiation Oncology Physician Assistant Direct Dial: [phone]  Fax: [phone]

## 2023-07-30 NOTE — Progress Notes (Signed)
  Radiation Oncology         [phone] ________________________________  Name: NASHUA SHRIVER MRN: [id number]  Date: 07/31/2023  DOB: 08/24/1956  SIMULATION AND TREATMENT PLANNING NOTE PUBIC ARCH STUDY  YN:WGNFAO, Misty Stanley, MD  Bjorn Pippin, MD  DIAGNOSIS: 67 y.o. gentleman with Stage T2a adenocarcinoma of the prostate with Gleason score of 3+4, and PSA of 5.75.   Oncology History  Malignant neoplasm of prostate (HCC)  03/26/2023 Cancer Staging   Staging form: Prostate, AJCC 8th Edition - Clinical stage from 03/26/2023: Stage IIB (cT2a, cN0, cM0, PSA: 5.8, Grade Group: 2) - Signed by Marcello Fennel, PA-C on 06/05/2023 Histopathologic type: Adenocarcinoma, NOS Stage prefix: Initial diagnosis Prostate specific antigen (PSA) range: Less than 10 Gleason primary pattern: 3 Gleason secondary pattern: 4 Gleason score: 7 Histologic grading system: 5 grade system Number of biopsy cores examined: 12 Number of biopsy cores positive: 8 Location of positive needle core biopsies: Both sides   06/05/2023 Initial Diagnosis   Malignant neoplasm of prostate (HCC)       ICD-10-CM   1. Malignant neoplasm of prostate (HCC)  C61       COMPLEX SIMULATION:  The patient presented today for evaluation for possible prostate seed implant. He was brought to the radiation planning suite and placed supine on the CT couch. A 3-dimensional image study set was obtained in upload to the planning computer. There, on each axial slice, I contoured the prostate gland. Then, using three-dimensional radiation planning tools I reconstructed the prostate in view of the structures from the transperineal needle pathway to assess for possible pubic arch interference. In doing so, I did not appreciate any pubic arch interference. Also, the patient's prostate volume was estimated based on the drawn structure. The volume was 46 cc.  Given the pubic arch appearance and prostate volume, patient remains a good candidate to proceed  with prostate seed implant. Today, he freely provided informed written consent to proceed.    PLAN: The patient will undergo prostate seed implant.   ________________________________  Artist Pais. Kathrynn Running, M.D.

## 2023-07-30 NOTE — Telephone Encounter (Signed)
RETURNED PATIENT'S PHONE CALL, LVM FOR A RETURN CALL 

## 2023-07-31 ENCOUNTER — Ambulatory Visit
Admission: RE | Admit: 2023-07-31 | Discharge: 2023-07-31 | Disposition: A | Discharge status: Home / Self Care | Payer: Medicare PPO | Source: Ambulatory Visit | Attending: Radiation Oncology | Admitting: Radiation Oncology

## 2023-07-31 ENCOUNTER — Ambulatory Visit
Admission: RE | Admit: 2023-07-31 | Discharge: 2023-07-31 | Disposition: A | Discharge status: Home / Self Care | Payer: Medicare PPO | Source: Ambulatory Visit | Attending: Urology | Admitting: Urology

## 2023-07-31 ENCOUNTER — Encounter: Payer: Self-pay | Admitting: Urology

## 2023-07-31 VITALS — Resp 19 | Ht 66.0 in | Wt 177.0 lb

## 2023-07-31 DIAGNOSIS — C61 Malignant neoplasm of prostate: Secondary | ICD-10-CM | POA: Insufficient documentation

## 2023-07-31 DIAGNOSIS — Z191 Hormone sensitive malignancy status: Secondary | ICD-10-CM | POA: Diagnosis not present

## 2023-07-31 NOTE — Progress Notes (Signed)
Pre-seed nursing interview for a diagnosis of Stage T2a adenocarcinoma of the prostate with Gleason score of 3+4, and PSA of 5.75.  Patient identity verified x2.   Patient reports doing well. Denies any issues at this time.  Meaningful use complete.  Urinary Management medication(s)- None Urology appointment date- 08/19/2023, with Dr. Annabell Howells at [facility]  Resp 19   Ht 5\' 6"  (1.676 m)   Wt 177 lb (80.3 kg)   BMI 28.57 kg/m   This concludes the interaction.  Ruel Favors, LPN

## 2023-08-07 ENCOUNTER — Encounter (HOSPITAL_BASED_OUTPATIENT_CLINIC_OR_DEPARTMENT_OTHER): Payer: Self-pay | Admitting: Urology

## 2023-08-12 ENCOUNTER — Encounter (HOSPITAL_BASED_OUTPATIENT_CLINIC_OR_DEPARTMENT_OTHER): Payer: Self-pay | Admitting: Urology

## 2023-08-12 NOTE — Progress Notes (Addendum)
Spoke w/ via phone for pre-op interview--- pt Lab needs dos----    [employer] results------  current EKG in epic/ chart COVID test -----patient states asymptomatic no test needed Arrive at ------- 0800 on 08-19-2023 NPO after MN NO Solid Food.  Clear liquids from MN until--- 0700 Med rec completed Medications to take morning of surgery ----- norvasc, protonix, celexa Diabetic medication ----- pt does ozempic every other week on Monday's ,  last dose 07-28-2023.  Pt verbalized understanding not to do prior to surgery. Patient instructed no nail polish to be worn day of surgery Patient instructed to bring photo id and insurance card day of surgery Patient aware to have Driver (ride ) / caregiver    for 24 hours after surgery - wife, patricia Patient Special Instructions ----- will do fleet enema morning of surgery Pre-Op special Instructions -----  pt has office visit cardiac clearance by Perlie Gold PA on 10-29-204 in epic/ chart Patient verbalized understanding of instructions that were given at this phone interview. Patient denies chest pain, sob, fever, cough at the interview.    Anesthesia Review:  HTN;  CAD s/p PCI DES x2 to LAD 06/ 2015 and 08/ 2015 s/p PCI DES x4 overlapping to RCA;  chronic LBBB;  hx syncope in 2019 symptomatic bradycardia/ pause,  per cardiology Avoid BB  (pt denies syncope/ near syncope since) Pt denies cardiac s&s, sob with any activity, and no peripheral swelling.  PCP:  Dr Sigmund Hazel Cardiologist :  Dr Eldridge Dace [facility] 07-15-2023) Chest x-ray : 12-28-2017 EKG : 07-15-2023 Echo : 12-28-2017 Event monitor:  01-19-2019 Stress test: 02-24-2014 Cardiac Cath : 04-20-2014 Activity level: see above  Blood Thinner/ Instructions [name] Dose:  Plavix ASA / Instructions/ Last Dose : No Pt stated was given instructions by cardiology.  Stated last dose will be 08-13-2023.

## 2023-08-18 ENCOUNTER — Telehealth: Payer: Self-pay | Admitting: *Deleted

## 2023-08-18 NOTE — Telephone Encounter (Signed)
Called patient to remind of procedure for 08-19-23, lvm for a return call

## 2023-08-18 NOTE — Anesthesia Preprocedure Evaluation (Signed)
Anesthesia Evaluation  Patient identified by MRN, date of birth, ID band Patient awake    Reviewed: Allergy & Precautions, NPO status , Patient's Chart, lab work & pertinent test results  History of Anesthesia Complications Negative for: history of anesthetic complications  Airway Mallampati: III  TM Distance: >3 FB Neck ROM: Full    Dental  (+) Poor Dentition, Dental Advisory Given   Pulmonary neg pulmonary ROS   Pulmonary exam normal breath sounds clear to auscultation       Cardiovascular hypertension (amlodipine), Pt. on medications (-) angina + CAD and + Cardiac Stents (multiple DES in 2015)  (-) CABG + dysrhythmias (PVCs, PACs, LBBB) + Valvular Problems/Murmurs (mild) MR  Rhythm:Regular Rate:Normal  HLD  TTE 12/28/2017: Study Conclusions   - Left ventricle: The cavity size was normal. Systolic function was    normal. The estimated ejection fraction was in the range of 55%    to 60%. Wall motion was normal; there were no regional wall    motion abnormalities. Left ventricular diastolic function    parameters were normal.  - Aortic valve: Transvalvular velocity was within the normal range.    There was no stenosis. There was no regurgitation.  - Mitral valve: Transvalvular velocity was within the normal range.    There was no evidence for stenosis. There was mild regurgitation.    Valve area by pressure half-time: 1.68 cm^2.  - Right ventricle: The cavity size was normal. Wall thickness was    normal. Systolic function was normal.  - Atrial septum: No defect or patent foramen ovale was identified    by color flow Doppler.  - Tricuspid valve: There was trivial regurgitation.  - Pulmonary arteries: Systolic pressure was within the normal    range. PA peak pressure: 28 mm Hg (S).     Neuro/Psych neg Seizures PSYCHIATRIC DISORDERS Anxiety      Neuromuscular disease (spinal stenosis)    GI/Hepatic Neg liver ROS,GERD   Medicated,,Diverticulosis    Endo/Other  diabetes, Type 2    Renal/GU negative Renal ROS   Prostate cancer    Musculoskeletal  (+) Arthritis ,    Abdominal   Peds  Hematology negative hematology ROS (+)   Anesthesia Other Findings Last semaglutide: 3 weeks ago  Last Plavix: 08/13/2023  Reproductive/Obstetrics                             Anesthesia Physical Anesthesia Plan  ASA: 3  Anesthesia Plan: General   Post-op Pain Management: Tylenol PO (pre-op)*   Induction: Intravenous  PONV Risk Score and Plan: 2 and Ondansetron, Dexamethasone and Treatment may vary due to age or medical condition  Airway Management Planned: Oral ETT  Additional Equipment:   Intra-op Plan:   Post-operative Plan: Extubation in OR  Informed Consent: I have reviewed the patients History and Physical, chart, labs and discussed the procedure including the risks, benefits and alternatives for the proposed anesthesia with the patient or authorized representative who has indicated his/her understanding and acceptance.     Dental advisory given  Plan Discussed with: CRNA and Anesthesiologist  Anesthesia Plan Comments: (Risks of general anesthesia discussed including, but not limited to, sore throat, hoarse voice, chipped/damaged teeth, injury to vocal cords, nausea and vomiting, allergic reactions, lung infection, heart attack, stroke, and death. All questions answered. )       Anesthesia Quick Evaluation

## 2023-08-19 ENCOUNTER — Ambulatory Visit (HOSPITAL_BASED_OUTPATIENT_CLINIC_OR_DEPARTMENT_OTHER): Payer: Self-pay | Admitting: Anesthesiology

## 2023-08-19 ENCOUNTER — Ambulatory Visit (HOSPITAL_BASED_OUTPATIENT_CLINIC_OR_DEPARTMENT_OTHER): Payer: Medicare PPO | Admitting: Anesthesiology

## 2023-08-19 ENCOUNTER — Other Ambulatory Visit: Payer: Self-pay

## 2023-08-19 ENCOUNTER — Ambulatory Visit (HOSPITAL_BASED_OUTPATIENT_CLINIC_OR_DEPARTMENT_OTHER)
Admission: RE | Admit: 2023-08-19 | Discharge: 2023-08-19 | Disposition: A | Discharge status: Home / Self Care | Payer: Medicare PPO | Attending: Urology | Admitting: Urology

## 2023-08-19 ENCOUNTER — Encounter (HOSPITAL_BASED_OUTPATIENT_CLINIC_OR_DEPARTMENT_OTHER)
Admission: RE | Disposition: A | Discharge status: Home / Self Care | Payer: Self-pay | Source: Home / Self Care | Attending: Urology

## 2023-08-19 ENCOUNTER — Encounter (HOSPITAL_BASED_OUTPATIENT_CLINIC_OR_DEPARTMENT_OTHER): Payer: Self-pay | Admitting: Urology

## 2023-08-19 ENCOUNTER — Ambulatory Visit (HOSPITAL_COMMUNITY): Payer: Medicare PPO

## 2023-08-19 DIAGNOSIS — I2510 Atherosclerotic heart disease of native coronary artery without angina pectoris: Secondary | ICD-10-CM | POA: Diagnosis not present

## 2023-08-19 DIAGNOSIS — Z803 Family history of malignant neoplasm of breast: Secondary | ICD-10-CM | POA: Insufficient documentation

## 2023-08-19 DIAGNOSIS — I10 Essential (primary) hypertension: Secondary | ICD-10-CM | POA: Diagnosis not present

## 2023-08-19 DIAGNOSIS — C61 Malignant neoplasm of prostate: Secondary | ICD-10-CM

## 2023-08-19 DIAGNOSIS — Z7985 Long-term (current) use of injectable non-insulin antidiabetic drugs: Secondary | ICD-10-CM | POA: Diagnosis not present

## 2023-08-19 DIAGNOSIS — N402 Nodular prostate without lower urinary tract symptoms: Secondary | ICD-10-CM | POA: Diagnosis not present

## 2023-08-19 DIAGNOSIS — R3912 Poor urinary stream: Secondary | ICD-10-CM | POA: Diagnosis not present

## 2023-08-19 DIAGNOSIS — Z01818 Encounter for other preprocedural examination: Secondary | ICD-10-CM

## 2023-08-19 DIAGNOSIS — E119 Type 2 diabetes mellitus without complications: Secondary | ICD-10-CM | POA: Insufficient documentation

## 2023-08-19 DIAGNOSIS — R8271 Bacteriuria: Secondary | ICD-10-CM | POA: Diagnosis not present

## 2023-08-19 DIAGNOSIS — E1165 Type 2 diabetes mellitus with hyperglycemia: Secondary | ICD-10-CM | POA: Diagnosis not present

## 2023-08-19 DIAGNOSIS — Z191 Hormone sensitive malignancy status: Secondary | ICD-10-CM | POA: Diagnosis not present

## 2023-08-19 HISTORY — PX: CYSTOSCOPY: SHX5120

## 2023-08-19 HISTORY — PX: SPACE OAR INSTILLATION: SHX6769

## 2023-08-19 HISTORY — DX: Presence of external hearing-aid: Z97.4

## 2023-08-19 HISTORY — DX: Prediabetes: R73.03

## 2023-08-19 HISTORY — DX: Mixed hyperlipidemia: E78.2

## 2023-08-19 HISTORY — DX: Insomnia, unspecified: G47.00

## 2023-08-19 HISTORY — DX: Nodular prostate with lower urinary tract symptoms: N40.3

## 2023-08-19 HISTORY — DX: Diverticulosis of large intestine without perforation or abscess without bleeding: K57.30

## 2023-08-19 HISTORY — DX: Generalized anxiety disorder: F41.1

## 2023-08-19 HISTORY — PX: RADIOACTIVE SEED IMPLANT: SHX5150

## 2023-08-19 LAB — POCT I-STAT, CHEM 8
BUN: 33 mg/dL — ABNORMAL HIGH (ref 8–23)
Calcium, Ion: 0.99 mmol/L — ABNORMAL LOW (ref 1.15–1.40)
Chloride: 109 mmol/L (ref 98–111)
Creatinine, Ser: 0.8 mg/dL (ref 0.61–1.24)
Glucose, Bld: 118 mg/dL — ABNORMAL HIGH (ref 70–99)
HCT: 43 % (ref 39.0–52.0)
Hemoglobin: 14.6 g/dL (ref 13.0–17.0)
Potassium: 5.1 mmol/L (ref 3.5–5.1)
Sodium: 140 mmol/L (ref 135–145)
TCO2: 25 mmol/L (ref 22–32)

## 2023-08-19 SURGERY — INSERTION, RADIATION SOURCE, PROSTATE
Anesthesia: General | Site: Prostate

## 2023-08-19 MED ORDER — PROPOFOL 10 MG/ML IV BOLUS
INTRAVENOUS | Status: DC | PRN
  Administered 2023-08-19: 160 mg via INTRAVENOUS

## 2023-08-19 MED ORDER — FENTANYL CITRATE (PF) 100 MCG/2ML IJ SOLN
INTRAMUSCULAR | Status: DC | PRN
  Administered 2023-08-19 (×2): 50 ug via INTRAVENOUS

## 2023-08-19 MED ORDER — MIDAZOLAM HCL 2 MG/2ML IJ SOLN
INTRAMUSCULAR | Status: DC | PRN
  Administered 2023-08-19: 2 mg via INTRAVENOUS

## 2023-08-19 MED ORDER — PHENYLEPHRINE HCL (PRESSORS) 10 MG/ML IV SOLN
INTRAVENOUS | Status: AC
  Filled 2023-08-19: qty 1

## 2023-08-19 MED ORDER — LACTATED RINGERS IV SOLN: INTRAVENOUS | Status: DC

## 2023-08-19 MED ORDER — EPHEDRINE SULFATE-NACL 50-0.9 MG/10ML-% IV SOSY
PREFILLED_SYRINGE | INTRAVENOUS | Status: DC | PRN
  Administered 2023-08-19 (×2): 10 mg via INTRAVENOUS

## 2023-08-19 MED ORDER — FENTANYL CITRATE (PF) 100 MCG/2ML IJ SOLN: 25.0000 ug | INTRAMUSCULAR | Status: DC | PRN

## 2023-08-19 MED ORDER — CIPROFLOXACIN IN D5W 400 MG/200ML IV SOLN
INTRAVENOUS | Status: AC
  Filled 2023-08-19: qty 200

## 2023-08-19 MED ORDER — MIDAZOLAM HCL 2 MG/2ML IJ SOLN
INTRAMUSCULAR | Status: AC
  Filled 2023-08-19: qty 2

## 2023-08-19 MED ORDER — ACETAMINOPHEN 500 MG PO TABS
1000.0000 mg | ORAL_TABLET | Freq: Once | ORAL | Status: AC
  Administered 2023-08-19: 1000 mg via ORAL

## 2023-08-19 MED ORDER — STERILE WATER FOR IRRIGATION IR SOLN
Status: DC | PRN
  Administered 2023-08-19: 3 mL

## 2023-08-19 MED ORDER — SODIUM CHLORIDE (PF) 0.9 % IJ SOLN
INTRAMUSCULAR | Status: DC | PRN
  Administered 2023-08-19: 10 mL

## 2023-08-19 MED ORDER — PHENYLEPHRINE 80 MCG/ML (10ML) SYRINGE FOR IV PUSH (FOR BLOOD PRESSURE SUPPORT)
PREFILLED_SYRINGE | INTRAVENOUS | Status: AC
  Filled 2023-08-19: qty 10

## 2023-08-19 MED ORDER — SODIUM CHLORIDE 0.9 % IV SOLN: INTRAVENOUS | Status: DC

## 2023-08-19 MED ORDER — ONDANSETRON HCL 4 MG/2ML IJ SOLN
INTRAMUSCULAR | Status: DC | PRN
  Administered 2023-08-19: 4 mg via INTRAVENOUS

## 2023-08-19 MED ORDER — DEXAMETHASONE SODIUM PHOSPHATE 10 MG/ML IJ SOLN
INTRAMUSCULAR | Status: DC | PRN
  Administered 2023-08-19: 5 mg via INTRAVENOUS

## 2023-08-19 MED ORDER — LIDOCAINE 2% (20 MG/ML) 5 ML SYRINGE
INTRAMUSCULAR | Status: DC | PRN
  Administered 2023-08-19: 60 mg via INTRAVENOUS

## 2023-08-19 MED ORDER — OXYCODONE HCL 5 MG PO TABS: 5.0000 mg | ORAL_TABLET | Freq: Once | ORAL | Status: DC | PRN

## 2023-08-19 MED ORDER — AMISULPRIDE (ANTIEMETIC) 5 MG/2ML IV SOLN: 10.0000 mg | Freq: Once | INTRAVENOUS | Status: DC | PRN

## 2023-08-19 MED ORDER — ROCURONIUM BROMIDE 10 MG/ML (PF) SYRINGE
PREFILLED_SYRINGE | INTRAVENOUS | Status: AC
  Filled 2023-08-19: qty 10

## 2023-08-19 MED ORDER — PROPOFOL 10 MG/ML IV BOLUS
INTRAVENOUS | Status: AC
  Filled 2023-08-19: qty 20

## 2023-08-19 MED ORDER — SUGAMMADEX SODIUM 200 MG/2ML IV SOLN
INTRAVENOUS | Status: DC | PRN
  Administered 2023-08-19: 200 mg via INTRAVENOUS

## 2023-08-19 MED ORDER — SODIUM CHLORIDE 0.9% FLUSH: 3.0000 mL | Freq: Two times a day (BID) | INTRAVENOUS | Status: DC

## 2023-08-19 MED ORDER — PHENYLEPHRINE HCL-NACL 20-0.9 MG/250ML-% IV SOLN
INTRAVENOUS | Status: DC | PRN
  Administered 2023-08-19: 30 ug/min via INTRAVENOUS

## 2023-08-19 MED ORDER — FENTANYL CITRATE (PF) 100 MCG/2ML IJ SOLN
INTRAMUSCULAR | Status: AC
  Filled 2023-08-19: qty 2

## 2023-08-19 MED ORDER — PROPOFOL 1000 MG/100ML IV EMUL
INTRAVENOUS | Status: AC
  Filled 2023-08-19: qty 100

## 2023-08-19 MED ORDER — CIPROFLOXACIN IN D5W 400 MG/200ML IV SOLN
400.0000 mg | INTRAVENOUS | Status: AC
  Administered 2023-08-19: 400 mg via INTRAVENOUS

## 2023-08-19 MED ORDER — ROCURONIUM BROMIDE 10 MG/ML (PF) SYRINGE
PREFILLED_SYRINGE | INTRAVENOUS | Status: DC | PRN
  Administered 2023-08-19: 60 mg via INTRAVENOUS

## 2023-08-19 MED ORDER — FLEET ENEMA RE ENEM: 1.0000 | ENEMA | Freq: Once | RECTAL | Status: DC

## 2023-08-19 MED ORDER — LIDOCAINE HCL (PF) 2 % IJ SOLN
INTRAMUSCULAR | Status: AC
  Filled 2023-08-19: qty 5

## 2023-08-19 MED ORDER — IOHEXOL 300 MG/ML  SOLN
INTRAMUSCULAR | Status: DC | PRN
  Administered 2023-08-19: 7 mL

## 2023-08-19 MED ORDER — ACETAMINOPHEN 500 MG PO TABS
ORAL_TABLET | ORAL | Status: AC
  Filled 2023-08-19: qty 2

## 2023-08-19 MED ORDER — TRAMADOL HCL 50 MG PO TABS: 50.0000 mg | ORAL_TABLET | Freq: Four times a day (QID) | ORAL | 0 refills | Status: DC | PRN

## 2023-08-19 MED ORDER — SODIUM CHLORIDE 0.9 % IR SOLN
Status: DC | PRN
  Administered 2023-08-19: 200 mL via INTRAVESICAL

## 2023-08-19 MED ORDER — OXYCODONE HCL 5 MG/5ML PO SOLN: 5.0000 mg | Freq: Once | ORAL | Status: DC | PRN

## 2023-08-19 MED ORDER — PHENYLEPHRINE 80 MCG/ML (10ML) SYRINGE FOR IV PUSH (FOR BLOOD PRESSURE SUPPORT)
PREFILLED_SYRINGE | INTRAVENOUS | Status: DC | PRN
  Administered 2023-08-19: 80 ug via INTRAVENOUS

## 2023-08-19 SURGICAL SUPPLY — 46 items
BAG URINE DRAIN 2000ML AR STRL (UROLOGICAL SUPPLIES) ×3 IMPLANT
BLADE CLIPPER SENSICLIP SURGIC (BLADE) ×3 IMPLANT
Bard Quick Link Cartridge with BrachySource I-125 IMPLANT
CATH FOLEY 2WAY SLVR 5CC 16FR (CATHETERS) ×3 IMPLANT
CATH FOLEY LF 20FR (CATHETERS) IMPLANT
CATH ROBINSON RED A/P 16FR (CATHETERS) IMPLANT
CATH ROBINSON RED A/P 20FR (CATHETERS) ×3 IMPLANT
CATH SILICONE 16FRX5CC (CATHETERS) IMPLANT
CLOTH BEACON ORANGE TIMEOUT ST (SAFETY) ×3 IMPLANT
CNTNR URN SCR LID CUP LEK RST (MISCELLANEOUS) ×3 IMPLANT
COVER BACK TABLE 60X90IN (DRAPES) ×3 IMPLANT
COVER MAYO STAND STRL (DRAPES) ×3 IMPLANT
DRSG TEGADERM 4X4.75 (GAUZE/BANDAGES/DRESSINGS) ×3 IMPLANT
DRSG TEGADERM 8X12 (GAUZE/BANDAGES/DRESSINGS) ×3 IMPLANT
GAUZE SPONGE 4X4 12PLY STRL (GAUZE/BANDAGES/DRESSINGS) IMPLANT
GLOVE BIO SURGEON STRL SZ 6.5 (GLOVE) IMPLANT
GLOVE BIO SURGEON STRL SZ7.5 (GLOVE) IMPLANT
GLOVE BIO SURGEON STRL SZ8 (GLOVE) IMPLANT
GLOVE BIOGEL PI IND STRL 6.5 (GLOVE) ×3 IMPLANT
GLOVE SURG ORTHO 8.5 STRL (GLOVE) ×3 IMPLANT
GLOVE SURG SS PI 8.0 STRL IVOR (GLOVE) ×3 IMPLANT
GLOVE SURG SS PI 8.5 STRL STRW (GLOVE) IMPLANT
GOWN STRL REUS W/TWL LRG LVL3 (GOWN DISPOSABLE) ×3 IMPLANT
GRID BRACH TEMP 18GA 2.8X3X.75 (MISCELLANEOUS) ×3 IMPLANT
HOLDER FOLEY CATH W/STRAP (MISCELLANEOUS) ×3 IMPLANT
IMPL SPACEOAR SYSTEM 10ML (Spacer) IMPLANT
IMPLANT SPACEOAR SYSTEM 10ML (Spacer) ×2 IMPLANT
IV NS 1000ML BAXH (IV SOLUTION) ×3 IMPLANT
KIT TURNOVER CYSTO (KITS) ×3 IMPLANT
NDL BRACHY 18G 5PK (NEEDLE) ×12 IMPLANT
NDL BRACHY 18G SINGLE (NEEDLE) IMPLANT
NDL PK MORGANSTERN STABILIZ (NEEDLE) ×3 IMPLANT
NEEDLE BRACHY 18G 5PK (NEEDLE) ×10 IMPLANT
NEEDLE BRACHY 18G SINGLE (NEEDLE) IMPLANT
NEEDLE PK MORGANSTERN STABILIZ (NEEDLE) ×2 IMPLANT
PACK CYSTO (CUSTOM PROCEDURE TRAY) ×3 IMPLANT
SHEATH ULTRASOUND LF (SHEATH) IMPLANT
SHEATH ULTRASOUND LTX NONSTRL (SHEATH) IMPLANT
SLEEVE SCD COMPRESS KNEE MED (STOCKING) ×3 IMPLANT
SUT BONE WAX W31G (SUTURE) IMPLANT
SYR 10ML LL (SYRINGE) ×3 IMPLANT
SYR CONTROL 10ML LL (SYRINGE) ×3 IMPLANT
TOWEL OR 17X24 6PK STRL BLUE (TOWEL DISPOSABLE) ×3 IMPLANT
UNDERPAD 30X36 HEAVY ABSORB (UNDERPADS AND DIAPERS) ×6 IMPLANT
WATER STERILE IRR 3000ML UROMA (IV SOLUTION) IMPLANT
WATER STERILE IRR 500ML POUR (IV SOLUTION) ×3 IMPLANT

## 2023-08-19 NOTE — Interval H&P Note (Signed)
History and Physical Interval Note:  08/19/2023 9:20 AM  Bobby Munoz  has presented today for surgery, with the diagnosis of PROSTATE CANCER.  The various methods of treatment have been discussed with the patient and family. After consideration of risks, benefits and other options for treatment, the patient has consented to  Procedure(s) with comments: RADIOACTIVE SEED IMPLANT/BRACHYTHERAPY IMPLANT (N/A) SPACE OAR INSTILLATION (N/A) - 90 MINS FOR CASE as a surgical intervention.  The patient's history has been reviewed, patient examined, no change in status, stable for surgery.  I have reviewed the patient's chart and labs.  Questions were answered to the patient's satisfaction.     Bjorn Pippin

## 2023-08-19 NOTE — Progress Notes (Signed)
Radiation Oncology         [phone] ________________________________  Name: Bobby Munoz MRN: [id number]  Date: 08/19/2023  DOB: May 09, 1956       Prostate Seed Implant  NF:AOZHYQ, Misty Stanley, MD  No ref. provider found  DIAGNOSIS:   67 y.o. gentleman with Stage T2a adenocarcinoma of the prostate with Gleason score of 3+4, and PSA of 5.75.   Oncology History  Malignant neoplasm of prostate (HCC)  03/26/2023 Cancer Staging   Staging form: Prostate, AJCC 8th Edition - Clinical stage from 03/26/2023: Stage IIB (cT2a, cN0, cM0, PSA: 5.8, Grade Group: 2) - Signed by Marcello Fennel, PA-C on 06/05/2023 Histopathologic type: Adenocarcinoma, NOS Stage prefix: Initial diagnosis Prostate specific antigen (PSA) range: Less than 10 Gleason primary pattern: 3 Gleason secondary pattern: 4 Gleason score: 7 Histologic grading system: 5 grade system Number of biopsy cores examined: 12 Number of biopsy cores positive: 8 Location of positive needle core biopsies: Both sides   06/05/2023 Initial Diagnosis   Malignant neoplasm of prostate (HCC)       ICD-10-CM   1. Pre-op testing  Z01.818 CBG per Guidelines for Diabetes Management for Patients Undergoing Surgery ([facility], AP, and [facility] only)    CBG per protocol    I-Stat, Chem 8 on day of surgery per protocol    CBG per Guidelines for Diabetes Management for Patients Undergoing Surgery ([facility], [facility], and [facility] only)    CBG per protocol    I-Stat, Chem 8 on day of surgery per protocol      PROCEDURE: Insertion of radioactive I-125 seeds into the prostate gland.  RADIATION DOSE: 145 Gy, definitive therapy.  TECHNIQUE: Bobby Munoz was brought to the operating room with the urologist. He was placed in the dorsolithotomy position. He was catheterized and a rectal tube was inserted. The perineum was shaved, prepped and draped. The ultrasound probe was then introduced by me into the rectum to see the prostate gland.  TREATMENT DEVICE: I attached the needle grid to  the ultrasound probe stand and anchor needles were placed.  3D PLANNING: The prostate was imaged in 3D using a sagittal sweep of the prostate probe. These images were transferred to the planning computer. There, the prostate, urethra and rectum were defined on each axial reconstructed image. Then, the software created an optimized 3D plan and a few seed positions were adjusted. The quality of the plan was reviewed using [facility] information for the target and the following two organs at risk:  Urethra and Rectum.  Then the accepted plan was printed and handed off to the radiation therapist.  Under my supervision, the custom loading of the seeds and spacers was carried out using the quick loader.  These pre-loaded needles were then placed into the needle holder.Marland Kitchen  PROSTATE VOLUME STUDY:  Using transrectal ultrasound the volume of the prostate was verified to be 53.2 cc.  SPECIAL TREATMENT PROCEDURE/SUPERVISION AND HANDLING: The pre-loaded needles were then delivered by the urologist under sagittal guidance. A total of 20 needles were used to deposit 72 seeds in the prostate gland. The individual seed activity was 0.483 mCi.  SpaceOAR:  Yes  COMPLEX SIMULATION: At the end of the procedure, an anterior radiograph of the pelvis was obtained to document seed positioning and count. Cystoscopy was performed by the urologist to check the urethra and bladder.  MICRODOSIMETRY: At the end of the procedure, the patient was emitting 0.144 mR/hr at 1 meter. Accordingly, he was considered safe for hospital discharge.  PLAN:  The patient will return to the radiation oncology clinic for post implant CT dosimetry in three weeks.   ________________________________  Bobby Munoz, M.D.

## 2023-08-19 NOTE — Discharge Instructions (Addendum)
  Post Anesthesia Home Care Instructions  Activity: Get plenty of rest for the remainder of the day. A responsible individual must stay with you for 24 hours following the procedure.  For the next 24 hours, DO NOT: -Drive a car -Paediatric nurse -Drink alcoholic beverages -Take any medication unless instructed by your physician -Make any legal decisions or sign important papers.  Meals: Start with liquid foods such as gelatin or soup. Progress to regular foods as tolerated. Avoid greasy, spicy, heavy foods. If nausea and/or vomiting occur, drink only clear liquids until the nausea and/or vomiting subsides. Call your physician if vomiting continues.  Special Instructions/Symptoms: Your throat may feel dry or sore from the anesthesia or the breathing tube placed in your throat during surgery. If this causes discomfort, gargle with warm salt water. The discomfort should disappear within 24 hours.  Radioactive Seed Implant Home Care Instructions   Activity:    Rest for the remainder of the day.  Do not drive or operate equipment today.  You may resume normal  activities in a few days as instructed by your physician, without risk of harmful radiation exposure to those around you, provided you follow the time and distance precautions on the Radiation Oncology Instruction Sheet.   Meals: Drink plenty of lipuids and eat light foods, such as gelatin or soup this evening .  You may return to normal meal plan tomorrow.  Return To Work: You may return to work as instructed by Naval architect.  Special Instruction:   If any seeds are found, use tweezers to pick up seeds and place in a glass container of any kind and bring to your physician's office.  Call your physician if any of these symptoms occur:  Persistent or heavy bleeding Urine stream diminishes or stops completely after catheter is removed Fever equal to or greater than 101 degrees F Cloudy urine with a strong foul odor Severe  pain  You may feel some burning pain and/or hesitancy when you urinate after the catheter is removed.  These symptoms may increase over the next few weeks, but should diminish within forur to six weeks.  Applying moist heat to the lower abdomen or a hot tub bath may help relieve the pain.  If the discomfort becomes severe, please call your physician for additional medications.

## 2023-08-19 NOTE — Transfer of Care (Signed)
Immediate Anesthesia Transfer of Care Note  Patient: Bobby Munoz  Procedure(s) Performed: RADIOACTIVE SEED IMPLANT/BRACHYTHERAPY IMPLANT (Prostate) SPACE OAR INSTILLATION (Prostate) CYSTOSCOPY FLEXIBLE (Bladder)  Patient Location: PACU  Anesthesia Type:General  Level of Consciousness: awake, alert , and oriented  Airway & Oxygen Therapy: Patient Spontanous Breathing  Post-op Assessment: Report given to RN and Post -op Vital signs reviewed and stable  Post vital signs: Reviewed and stable  Last Vitals:  Vitals Value Taken Time  BP 137/85 08/19/23 1200  Temp 36.6 C 08/19/23 1200  Pulse 87 08/19/23 1206  Resp 22 08/19/23 1206  SpO2 95 % 08/19/23 1206  Vitals shown include unfiled device data.  Last Pain:  Vitals:   08/19/23 0814  TempSrc: Oral         Complications: No notable events documented.

## 2023-08-19 NOTE — Op Note (Signed)
PATIENT:  Bobby Munoz  PRE-OPERATIVE DIAGNOSIS:  Adenocarcinoma of the prostate  POST-OPERATIVE DIAGNOSIS:  Same  PROCEDURE:  Procedure(s): 1. I-125 radioactive seed implantation 2. SpaceOAR implantation. 3.  Cystoscopy  SURGEON:  Surgeon(s): Bjorn Pippin MD  Radiation oncologist: Dr. Margaretmary Dys  ANESTHESIA:  General  EBL:  Minimal  DRAINS: 16 French Foley catheter  INDICATION: Bobby Munoz is a 67 y.o. with Stage T2a, Gleason 7(3+4)  prostate cancer who has elected brachytherapy for treatment.  Description of procedure: After informed consent the patient was brought to the major OR, placed on the table and administered general anesthesia. He was then moved to the modified lithotomy position with his perineum perpendicular to the floor. His perineum and genitalia were then sterilely prepped. An official timeout was then performed. A 16 French Foley catheter was then placed in the bladder and filled with dilute contrast, a rectal tube was placed in the rectum and the transrectal ultrasound probe was placed in the rectum and affixed to the stand. He was then sterilely draped.  The sterile grid was installed.   Anchor needles were then placed.   Real time ultrasonography was used along with the seed planning software spot-pro version 3.1-00. This was used to develop the seed plan including the number of needles as well as number of seeds required for complete and adequate coverage. Real-time ultrasonography was then used along with the previously developed plan  to implant a total of 72 seeds using 20 needles for a target dose of 145 Gy. This proceeded without difficulty or complication.  The anchor needles and guide were removed and the SpaceOAR needle was passed under US guidance into the fat stripe posterior to the prostate with the tip in the midline at mid prostate. A puff of NS confirmed appropriate positioning and the SpaceOAR polymer was then injected over 10 seconds into the space  with excellent distribution.     A Foley catheter was then removed as well as the transrectal ultrasound probe and rectal probe. Flexible cystoscopy was then performed using the 17 French flexible scope which revealed a normal urethra throughout its length down to the sphincter which appeared intact. The prostatic urethra was short without obstruction. The bladder was then entered and fully and systematically inspected.  The ureteral orifices were noted to be of normal configuration and position. The mucosa revealed no evidence of tumors. There were also no stones identified within the bladder.  No seeds or spacers were seen and/or removed from the bladder.  The cystoscope was then removed.  The drapes were removed.  The perineum was cleaned and dressed.  He was taken out of the lithotomy position and was awakened and taken to recovery room in stable and satisfactory condition. He tolerated procedure well and there were no intraoperative complications.

## 2023-08-19 NOTE — Anesthesia Procedure Notes (Signed)
Procedure Name: Intubation Date/Time: 08/19/2023 10:40 AM  Performed by: Francie Massing, CRNAPre-anesthesia Checklist: Patient identified, Emergency Drugs available, Suction available and Patient being monitored Patient Re-evaluated:Patient Re-evaluated prior to induction Oxygen Delivery Method: Circle system utilized Preoxygenation: Pre-oxygenation with 100% oxygen Induction Type: IV induction Ventilation: Mask ventilation without difficulty Laryngoscope Size: Mac and 4 Grade View: Grade I Tube type: Oral Tube size: 7.0 mm Number of attempts: 1 Airway Equipment and Method: Stylet and Oral airway Placement Confirmation: ETT inserted through vocal cords under direct vision, positive ETCO2 and breath sounds checked- equal and bilateral Secured at: 24 cm Tube secured with: Tape Dental Injury: Teeth and Oropharynx as per pre-operative assessment

## 2023-08-19 NOTE — H&P (Signed)
I have prostate cancer.     07/14/23: Bobby Munoz returns today in f/u. He is scheduled for a seed implant on 08/19/23 for his prostate cancer as discussed below. His IPSS is 5.   GU Hx: He has T2a Nx Mx GG2 prostate cancer found on a biopsy for an elevated PSA of 5.56 and an 8mm prostate nodule. His IPSS is 10 and his SHIM is 21.   Path: 5 cores of GG1, 3 cores of GG2 and 2 cores with atypia.   He has DM and CAD as comorbidites.    ALLERGIES: Latex - Skin Rash Shellfish - Vomiting    MEDICATIONS: Plavix 75 mg tablet  Amlodipine Besylate  Atorvastatin Calcium 80 mg tablet  Citalopram Hbr 40 mg tablet  Gabapentin 300 mg capsule  Ibuprofen 200 mg tablet  Nitroglycerin 0.4 mg tablet, sublingual  Ozempic 0.25 mg or 0.5 mg dose (2 mg/3 ml) pen injector  Pantoprazole Sodium 40 mg tablet, delayed release  Trazodone Hcl 100 mg tablet  Tylenol  Vitamin D2 1,250 mcg (50,000 unit) capsule  Zetia 10 mg tablet     GU PSH: Prostate Needle Biopsy - 03/26/2023       PSH Notes: LIPOMA REMOVAL, 6 stents    NON-GU PSH: Cardiac Stent Placement Cataract surgery, Bilateral PTCA Surgical Pathology, Gross And Microscopic Examination For Prostate Needle - 03/26/2023 Tonsillectomy..     GU PMH: Elevated PSA - 04/23/2023, - 03/26/2023, He has an elevated PSA with a right mid prostate nodule. I am going to get him set up for a biopsy and reviewed the risks of bleeding, infection and voiding difficulty. Levaquin sent. I will get him cleared to come off of the plavix by Dr. Eldridge Dace. , - 01/31/2023 Prostate Cancer, He has T2a Nx Mx GG2 prostate cancer with a 44ml prostate, mild/mod LUTS and minimal ED. I discussed options and reviewed the risks and benefits of active surveillance, RALP, Seeds and EXRT and I mentioned Cryo and HIFU. I discussed the use of SpaceOAR and fiducial markers and their associated risks and benefits. I discussed consideration of a Decipher test if he has any desire to consider surveillance.  He is most interested in a seed implant which I think is appropriate in his situation. I will get him seen by Dr. Kathrynn Running. I discussed the risks, benefits and rational for Active surveillance. I discussed the natural history of prostate cancer and the low risk of prostate cancer mortality for low risk disease. I reviewed the risks of the procedure including failure to treat a cancer that would benefit for treatment leading to possible metastatic or fatal disease, the psychological burden of having an untreated prostate cancer and the need for multiple biopsies with their attendant risks. I reviewed the benefits including avoiding invasive procedures that are unlikely to impact survival but can be associated with significant risk and side effects impacting quality of life. I discussed the risks, benefits and rational for radical prostatectomy and reviewed the surgical approaches. I reviewed the risks of bleeding, infection, injury to the bowel, bladder, ureters, rectum and other pelvic structures, bowel obstruction, wound complications and hernias, barotrauma, need for conversion to open from robotic, nerve compression injury, urine leak, anastomotic stricture, incontinence, erectile dysfunction, thrombotic events, anesthetic complications and mortality. I discussed the preoperative preparation and the expected hospital stay and postoperative recovery as well as the risks of recurrence and need for salvage therapy. I discussed the risks, benefits and rational for Brachytherapy. I reviewed the details of the procedure and  the postoperative recovery expectations. I reviewed the risks of bleeding, infection, urinary retention with need for catheter drainage, injury to adjacent structures, severe prolonged LUTS possibly requiring medical or surgical therapy, incontinence, strictures, erectile dysfunction, bowel and bladder radiation injury, thrombotic events, anesthetic complications, urethra-rectal fistulae with need  for additional surgery for correction and possible colostomy, mortality and delay radiation effects with a risk of secondary malignancies, treatment failure with need for salvage therapy. I discussed the risks, benefits and rational for EXRT. I reviewed the need for placement of fiducial markers and the time commitment for therapy. I reviewed the risks including fatigue, skin changes, local alopecia, rectal irritation with bleeding or change in bowel habits, bladder irritation with LUT's, future bleeding and bladder contraction, incontinence, erectile dysfunction, strictures, urethra-rectal fistulae with need for additional surgery for correction and possible colostomy, and there risk of secondary malignancies or treatment failure requiring salvage therapy. - 04/23/2023 Prostate nodule w/ LUTS - 04/23/2023, - 03/26/2023, He has moderate LUTS with a reduced stream. , - 01/31/2023 Weak Urinary Stream - 04/23/2023, - 01/31/2023      PMH Notes: LUMBAR SPINAL STENOSIS   NON-GU PMH: Anxiety Arthritis Diabetes Type 2 Diverticulosis GERD Heart disease, unspecified Hypercholesterolemia Hypertension Vitamin D deficiency, unspecified    FAMILY HISTORY: Breast Cancer - Mother heart failure - Father    Notes: 3 children   SOCIAL HISTORY: Marital Status: Married Preferred Language: English; Race: White Current Smoking Status: Patient has never smoked.   Tobacco Use Assessment Completed: Used Tobacco in last 30 days? Drinks 1 drink per day.  Drinks 1 caffeinated drink per day. 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    REVIEW OF SYSTEMS:    GU Review Male:   Patient denies frequent urination, hard to postpone urination, burning/ pain with urination, get up at night to urinate, leakage of urine, stream starts and stops, trouble starting your stream, have to strain to urinate , erection problems, and penile pain.  Gastrointestinal (Upper):   Patient denies nausea, vomiting, and indigestion/  heartburn.  Gastrointestinal (Lower):   Patient denies diarrhea and constipation.  Constitutional:   Patient denies night sweats, fever, weight loss, and fatigue.  Skin:   Patient denies skin rash/ lesion and itching.  Eyes:   Patient denies blurred vision and double vision.  Ears/ Nose/ Throat:   Patient denies sore throat and sinus problems.  Hematologic/Lymphatic:   Patient denies swollen glands and easy bruising.  Cardiovascular:   Patient denies leg swelling and chest pains.  Respiratory:   Patient denies cough and shortness of breath.  Endocrine:   Patient denies excessive thirst.  Musculoskeletal:   Patient denies back pain and joint pain.  Neurological:   Patient denies headaches and dizziness.  Psychologic:   Patient denies depression and anxiety.   VITAL SIGNS: None   MULTI-SYSTEM PHYSICAL EXAMINATION:    Constitutional: Well-nourished. No physical deformities. Normally developed. Good grooming.  Respiratory: No labored breathing, no use of accessory muscles.   Cardiovascular: Regular rate and rhythm. No murmur, no gallop.      Complexity of Data:  Records Review:   AUA Symptom Score, Previous Doctor Records, Previous Patient Records  Urine Test Review:   Urinalysis   PROCEDURES:          Urinalysis w/Scope Dipstick Dipstick Cont'd Micro  Color: Amber Bilirubin: Neg mg/dL WBC/hpf: 0 - 5/hpf  Appearance: Slightly Cloudy Ketones: Neg mg/dL RBC/hpf: 0 - 2/hpf  Specific Gravity: 1.025 Blood: Neg ery/uL Bacteria: Mod (26-50/hpf)  pH: 6.0  Protein: 1+ mg/dL Cystals: NS (Not Seen)  Glucose: Neg mg/dL Urobilinogen: 2.0 mg/dL Casts: Hyaline    Nitrites: Neg Trichomonas: Not Present    Leukocyte Esterase: Neg leu/uL Mucous: Present      Epithelial Cells: 0 - 5/hpf      Yeast: NS (Not Seen)      Sperm: Not Present    ASSESSMENT:      ICD-10 Details  1 GU:   Prostate Cancer - C61 Chronic, Stable - He is doing well with minimal LUTS but he does have some bacteriuria. I will  get a culture to be safe.   3   Prostate nodule w/ LUTS - N40.3 Chronic, Stable - He has mild LUTS.   4   Weak Urinary Stream - R39.12 Minor  2 NON-GU:   Bacteriuria - R82.71 Minor   PLAN:            Medications Stop Meds: Levofloxacin 750 mg tablet 1 po 1 hour prior to the procedure  Start: 01/31/2023  Discontinue: 07/14/2023  - Reason: The medication cycle was completed.            Orders Labs Urine Culture          Schedule Return Visit/Planned Activity: Keep Scheduled Appointment             Note: He can cancel the 11/19 appointment and keep the surgical appointment.           Document Letter(s):  Created for Patient: Clinical Summary         Notes:   CC: Dr. Sigmund Hazel.         Next Appointment:      Next Appointment: 08/05/2023 01:30 PM    Appointment Type: Pre OP Appontment    Location: [facility] [phone]    Provider: Ulyses Amor, PA    Reason for Visit: PRE OP DR [facility]

## 2023-08-19 NOTE — Anesthesia Postprocedure Evaluation (Signed)
Anesthesia Post Note  Patient: Bobby Munoz  Procedure(s) Performed: RADIOACTIVE SEED IMPLANT/BRACHYTHERAPY IMPLANT (Prostate) SPACE OAR INSTILLATION (Prostate) CYSTOSCOPY FLEXIBLE (Bladder)     Patient location during evaluation: PACU Anesthesia Type: General Level of consciousness: awake Pain management: pain level controlled Vital Signs Assessment: post-procedure vital signs reviewed and stable Respiratory status: spontaneous breathing, nonlabored ventilation and respiratory function stable Cardiovascular status: blood pressure returned to baseline and stable Postop Assessment: no apparent nausea or vomiting Anesthetic complications: no   No notable events documented.  Last Vitals:  Vitals:   08/19/23 1228 08/19/23 1230  BP:  120/76  Pulse: 75 69  Resp: 15 20  Temp: (!) 36.1 C   SpO2: 95% 94%    Last Pain:  Vitals:   08/19/23 1230  TempSrc:   PainSc: 0-No pain                 Bobby Munoz

## 2023-08-21 ENCOUNTER — Other Ambulatory Visit: Payer: Self-pay | Admitting: Urology

## 2023-08-21 ENCOUNTER — Encounter (HOSPITAL_BASED_OUTPATIENT_CLINIC_OR_DEPARTMENT_OTHER): Payer: Self-pay | Admitting: Urology

## 2023-08-21 DIAGNOSIS — C61 Malignant neoplasm of prostate: Secondary | ICD-10-CM

## 2023-08-22 ENCOUNTER — Telehealth: Payer: Self-pay | Admitting: *Deleted

## 2023-08-22 NOTE — Telephone Encounter (Signed)
CALLED PATIENT TO INFORM OF MRI FOR 08-27-23- ARRIVAL TIME- 1:15 PM @ [facility], PATIENT TO BE NPO-4 HRS. PRIOR TO SCAN, SPOKE WITH PATIENT AND HE IS AWARE OF THIS SCAN AND THE INSTRUCTIONS

## 2023-08-22 NOTE — Progress Notes (Signed)
Patient was a RadOnc Consult on 06/05/23 for his stage T2a adenocarcinoma of the prostate with Gleason score of 3+4, and PSA of 5.75.  Patient proceed with treatment recommendations of brachytherapy and had his treatment on 08/19/23.   Patient is scheduled for a post seed simulation on 12/18 and his post op with urology on 09/08/23.   RN left message with direct contact for call back if any additional questions or concerns.

## 2023-08-26 ENCOUNTER — Encounter (INDEPENDENT_AMBULATORY_CARE_PROVIDER_SITE_OTHER): Payer: Self-pay | Admitting: Adult Health

## 2023-08-26 ENCOUNTER — Ambulatory Visit (INDEPENDENT_AMBULATORY_CARE_PROVIDER_SITE_OTHER): Payer: Medicare PPO | Admitting: Adult Health

## 2023-08-26 VITALS — BP 116/75 | HR 51 | Temp 97.7°F | Ht 66.0 in | Wt 171.0 lb

## 2023-08-26 DIAGNOSIS — Z6827 Body mass index (BMI) 27.0-27.9, adult: Secondary | ICD-10-CM | POA: Diagnosis not present

## 2023-08-26 DIAGNOSIS — C61 Malignant neoplasm of prostate: Secondary | ICD-10-CM

## 2023-08-26 DIAGNOSIS — E1169 Type 2 diabetes mellitus with other specified complication: Secondary | ICD-10-CM | POA: Diagnosis not present

## 2023-08-26 DIAGNOSIS — E669 Obesity, unspecified: Secondary | ICD-10-CM | POA: Diagnosis not present

## 2023-08-26 DIAGNOSIS — Z7985 Long-term (current) use of injectable non-insulin antidiabetic drugs: Secondary | ICD-10-CM | POA: Diagnosis not present

## 2023-08-26 DIAGNOSIS — Z6833 Body mass index (BMI) 33.0-33.9, adult: Secondary | ICD-10-CM

## 2023-08-26 MED ORDER — BLOOD GLUCOSE MONITORING SUPPL DEVI: 1.0000 | Freq: Three times a day (TID) | 0 refills | Status: DC

## 2023-08-26 NOTE — Progress Notes (Signed)
WEIGHT SUMMARY AND BIOMETRICS  Vitals Temp: 97.7 F (36.5 C) BP: 116/75 Pulse Rate: (!) 51 SpO2: 97 %   Anthropometric Measurements Height: 5\' 6"  (1.676 m) Weight: 171 lb (77.6 kg) BMI (Calculated): 27.61 Weight at Last Visit: 169lb Weight Lost Since Last Visit: 0 Weight Gained Since Last Visit: 2lb Starting Weight: 206lb Total Weight Loss (lbs): 35 lb (15.9 kg) Peak Weight: 215lb   Body Composition  Body Fat %: 30.6 % Fat Mass (lbs): 52.6 lbs Muscle Mass (lbs): 113 lbs Total Body Water (lbs): 84.2 lbs Visceral Fat Rating : 17   Other Clinical Data Fasting: no Labs: no Today's Visit #: 34 Starting Date: 11/22/20    Chief Complaint:   OBESITY Bobby Munoz is here to discuss his progress with his obesity treatment plan. He is on the the Category 3 Plan and states he is following his eating plan approximately 70 % of the time.  He states he is exercising Walking 60 minutes 3 times per week.   Interim History:  His father has hx of Prostate Cancer, dx'd at age 43 He will turn 67 soon!  08/19/2023  RADIOACTIVE SEED IMPLANT/BRACHYTHERAPY IMPLANT   SPACE OAR INSTILLATION    Due to this procedure, he held weekly Ozempic 1mg  for three weeks. He restarted 08/24/2023- denies SE  Subjective:   1. Malignant neoplasm of prostate [facility]) His father has hx of Prostate Cancer, dx'd at age 68 12/30/2022 PSA 5.78 03/27/2023 path report- malignancy  08/19/2023 Radioactive seed implantation/brachytherapy implant with Dr. Annabell Howells He denies pain at present, he is slowly advancing daily walking.  2. Type 2 diabetes mellitus with other specified complication, without long-term current use of insulin (HCC) Lab Results  Component Value Date   HGBA1C 5.3 04/14/2023   HGBA1C 5.7 (H) 10/02/2022   HGBA1C 5.8 (H) 06/04/2022    He is not checking home CBG- does not have glucometer He denies sx's of hypoglycemia He is on weekly Ozempic 1mg - was off 3 weeks due to Urology surgery,  restarted 08/24/2023 Denies mass in neck, dysphagia, dyspepsia, persistent hoarseness, abdominal pain, or N/V/C  He endorses stable appetite  Assessment/Plan:   1. Malignant neoplasm of prostate [facility]) F/u with Urology team as directed  2. Type 2 diabetes mellitus with other specified complication, without long-term current use of insulin (HCC) Refill - Blood Glucose Monitoring Suppl DEVI; 1 each by Does not apply route in the morning, at noon, and at bedtime. May substitute to any manufacturer covered by patient's insurance.  Dispense: 1 each; Refill: 0  3. Obesity, current BMI 27.61  Bobby Munoz is currently in the action stage of change. As such, his goal is to continue with weight loss efforts. He has agreed to the Category 3 Plan.   Exercise goals: No exercise has been prescribed at this time.  Behavioral modification strategies: increasing lean protein intake, decreasing simple carbohydrates, increasing vegetables, increasing water intake, no skipping meals, meal planning and cooking strategies, keeping healthy foods in the home, holiday eating strategies , and planning for success.  Bobby Munoz has agreed to follow-up with our clinic in 4 weeks. He was informed of the importance of frequent follow-up visits to maximize his success with intensive lifestyle modifications for his multiple health conditions.   Objective:   Blood pressure 116/75, pulse (!) 51, temperature 97.7 F (36.5 C), height 5\' 6"  (1.676 m), weight 171 lb (77.6 kg), SpO2 97%. Body mass index is 27.6 kg/m.  General: Cooperative, alert, well developed, in no acute distress.  HEENT: Conjunctivae and lids unremarkable. Cardiovascular: Regular rhythm.  Lungs: Normal work of breathing. Neurologic: No focal deficits.   Lab Results  Component Value Date   CREATININE 0.80 08/19/2023   BUN 33 (H) 08/19/2023   NA 140 08/19/2023   K 5.1 08/19/2023   CL 109 08/19/2023   CO2 24 06/23/2023   Lab Results  Component Value Date    ALT 21 06/23/2023   AST 22 06/23/2023   ALKPHOS 62 06/23/2023   BILITOT 0.7 06/23/2023   Lab Results  Component Value Date   HGBA1C 5.3 04/14/2023   HGBA1C 5.7 (H) 10/02/2022   HGBA1C 5.8 (H) 06/04/2022   HGBA1C 5.7 (H) 01/31/2022   HGBA1C 5.9 (H) 10/04/2021   Lab Results  Component Value Date   INSULIN 48.6 (H) 04/14/2023   INSULIN 14.4 10/02/2022   INSULIN 14.5 06/04/2022   INSULIN 13.8 01/31/2022   INSULIN 13.5 10/04/2021   Lab Results  Component Value Date   TSH 2.370 11/22/2020   Lab Results  Component Value Date   CHOL 114 04/14/2023   HDL 55 04/14/2023   LDLCALC 40 04/14/2023   TRIG 104 04/14/2023   CHOLHDL 2.1 04/14/2023   Lab Results  Component Value Date   VD25OH 43.8 06/23/2023   VD25OH 47.6 04/14/2023   VD25OH 34.8 10/02/2022   Lab Results  Component Value Date   WBC 9.8 11/22/2020   HGB 14.6 08/19/2023   HCT 43.0 08/19/2023   MCV 97 11/22/2020   PLT 282 11/22/2020   No results found for: "IRON", "TIBC", "FERRITIN"  Attestation Statements:   Reviewed by clinician on day of visit: allergies, medications, problem list, medical history, surgical history, family history, social history, and previous encounter notes.  I have reviewed the above documentation for accuracy and completeness, and I agree with the above. -  Forever Arechiga d. Hennessy Bartel, NP-C

## 2023-08-27 ENCOUNTER — Ambulatory Visit (HOSPITAL_COMMUNITY)
Admission: RE | Admit: 2023-08-27 | Discharge: 2023-08-27 | Disposition: A | Discharge status: Home / Self Care | Payer: Medicare PPO | Source: Ambulatory Visit | Attending: Urology | Admitting: Urology

## 2023-08-27 DIAGNOSIS — C61 Malignant neoplasm of prostate: Secondary | ICD-10-CM | POA: Insufficient documentation

## 2023-08-29 ENCOUNTER — Other Ambulatory Visit: Payer: Self-pay | Admitting: Interventional Cardiology

## 2023-08-29 ENCOUNTER — Other Ambulatory Visit (HOSPITAL_COMMUNITY)
Admission: RE | Admit: 2023-08-29 | Discharge: 2023-08-29 | Disposition: A | Discharge status: Home / Self Care | Payer: Medicare PPO | Source: Ambulatory Visit | Attending: Oncology | Admitting: Oncology

## 2023-08-29 DIAGNOSIS — Z006 Encounter for examination for normal comparison and control in clinical research program: Secondary | ICD-10-CM

## 2023-09-02 ENCOUNTER — Telehealth: Payer: Self-pay | Admitting: *Deleted

## 2023-09-02 DIAGNOSIS — C61 Malignant neoplasm of prostate: Secondary | ICD-10-CM | POA: Diagnosis not present

## 2023-09-02 NOTE — Telephone Encounter (Signed)
CALLED PATIENT TO REMIND OF POST SEED APPTS. FOR 09-03-23, LVM FOR A RETURN CALL

## 2023-09-02 NOTE — Progress Notes (Signed)
Post-seed nursing interview for a diagnosis of Stage T2a adenocarcinoma of the prostate with Gleason score of 3+4, and PSA of 5.75.  Patient identity verified x2.   Patient reports occasional, mild hematuria. Patient denies all other related issues at this time. Reported as per patient's spouse, Mrs. Kendall.  Meaningful use complete.  I-PSS score- 1 - Mild SHIM score- 3 Urinary Management medication(s) none Urology appointment date- Pending with Dr. Annabell Howells at [facility]  Vitals- See today's vitals section for 09/03/2023  This concludes the interaction.  Ruel Favors, LPN

## 2023-09-03 ENCOUNTER — Ambulatory Visit
Admission: RE | Admit: 2023-09-03 | Discharge: 2023-09-03 | Disposition: A | Discharge status: Home / Self Care | Payer: Medicare PPO | Source: Ambulatory Visit | Attending: Urology | Admitting: Urology

## 2023-09-03 ENCOUNTER — Ambulatory Visit
Admission: RE | Admit: 2023-09-03 | Discharge: 2023-09-03 | Disposition: A | Discharge status: Home / Self Care | Payer: Medicare PPO | Source: Ambulatory Visit | Attending: Radiation Oncology | Admitting: Radiation Oncology

## 2023-09-03 VITALS — BP 159/92 | HR 78 | Temp 98.6°F | Resp 18 | Ht 66.0 in | Wt 174.8 lb

## 2023-09-03 VITALS — BP 160/92 | HR 97 | Temp 98.6°F | Resp 18 | Ht 66.0 in | Wt 174.8 lb

## 2023-09-03 DIAGNOSIS — C61 Malignant neoplasm of prostate: Secondary | ICD-10-CM

## 2023-09-03 NOTE — Progress Notes (Signed)
Radiation Oncology         [phone] ________________________________  Name: Bobby Munoz MRN: [id number]  Date: 09/03/2023  DOB: [birth year]  Post-Seed Follow-Up Visit Note  CC: Sigmund Hazel, MD  Bjorn Pippin, MD  Diagnosis:   67 y.o. gentleman with Stage T2a adenocarcinoma of the prostate with Gleason score of 3+4, and PSA of 5.75.       ICD-10-CM   1. Malignant neoplasm of prostate (HCC)  C61       Interval Since Last Radiation:  2 weeks 08/19/23:  Insertion of radioactive I-125 seeds into the prostate gland; 145 Gy, definitive therapy with placement of SpaceOAR gel.  Narrative:  The patient returns today for routine follow-up.  He is complaining of increased urinary frequency and urinary hesitation symptoms. He filled out a questionnaire regarding urinary function today providing and overall IPSS score of 3 characterizing his symptoms as minimal.  His pre-implant score was 7.  He has continued to see occasional, rare scant hematuria but denies any associated dysuria, straining to void or incontinence.  He has not had any recent fever, chills or night sweats and reports a healthy appetite.  He denies any abdominal pain or bowel symptoms.  He has not noticed any significant change in his energy level and overall, is quite pleased with his progress to date.  ALLERGIES:  is allergic to shellfish allergy and latex.  Meds: Current Outpatient Medications  Medication Sig Dispense Refill   acetaminophen (TYLENOL) 500 MG tablet Take 500 mg by mouth every 8 (eight) hours as needed. Take two tablets by mouth up to 3 times daily as needed.     amLODipine (NORVASC) 2.5 MG tablet Take 1 tablet (2.5 mg total) by mouth daily. (Patient taking differently: Take 2.5 mg by mouth daily.) 90 tablet 3   atorvastatin (LIPITOR) 80 MG tablet TAKE 1 TABLET(80 MG) BY MOUTH DAILY 90 tablet 3   Blood Glucose Monitoring Suppl DEVI 1 each by Does not apply route in the morning, at noon, and at bedtime. May  substitute to any manufacturer covered by patient's insurance. 1 each 0   citalopram (CELEXA) 40 MG tablet Take 40 mg by mouth daily.     clopidogrel (PLAVIX) 75 MG tablet TAKE 1 TABLET BY MOUTH EVERY DAY 90 tablet 3   ezetimibe (ZETIA) 10 MG tablet TAKE 1 TABLET(10 MG) BY MOUTH DAILY 90 tablet 3   gabapentin (NEURONTIN) 300 MG capsule Take 300 mg by mouth at bedtime. Takes 2 tablets BID.     nitroGLYCERIN (NITROSTAT) 0.4 MG SL tablet Place 1 tablet (0.4 mg total) under the tongue every 5 (five) minutes as needed for chest pain (X3 DOSES BEFORE CALLING 911). 25 tablet 3   pantoprazole (PROTONIX) 40 MG tablet TAKE 1 TABLET(40 MG) BY MOUTH DAILY (Patient taking differently: Take 40 mg by mouth daily.) 30 tablet 1   Semaglutide, 1 MG/DOSE, 4 MG/3ML SOPN Inject 1 mg as directed once a week. (Patient taking differently: Inject 1 mg as directed as directed. Per pt every other week---  Monday's) 9 mL 0   tiZANidine (ZANAFLEX) 4 MG tablet Take 4 mg by mouth at bedtime.     traMADol (ULTRAM) 50 MG tablet Take 1 tablet (50 mg total) by mouth every 6 (six) hours as needed. 12 tablet 0   traZODone (DESYREL) 100 MG tablet Take 100 mg by mouth at bedtime.     Vitamin D, Ergocalciferol, (DRISDOL) 1.25 MG (50000 UNIT) CAPS capsule Take 1 capsule (50,000 Units  total) by mouth every 7 (seven) days. (Patient taking differently: Take 50,000 Units by mouth every 7 (seven) days. Wednesday's) 8 capsule 0   No current facility-administered medications for this encounter.    Physical Findings: In general this is a well appearing Caucasian male in no acute distress. He's alert and oriented x4 and appropriate throughout the examination. Cardiopulmonary assessment is negative for acute distress and he exhibits normal effort.   Lab Findings: Lab Results  Component Value Date   WBC 9.8 11/22/2020   HGB 14.6 08/19/2023   HCT 43.0 08/19/2023   MCV 97 11/22/2020   PLT 282 11/22/2020    Radiographic Findings:  Patient  underwent CT imaging in our clinic for post implant dosimetry. The CT will be reviewed by Dr. Kathrynn Running to confirm there is an adequate distribution of radioactive seeds throughout the prostate gland and ensure that there are no seeds in or near the rectum.  We suspect the final radiation plan and dosimetry will show appropriate coverage of the prostate gland. He understands that we will call and inform him of any unexpected findings on further review of his imaging and dosimetry.  Impression/Plan: 67 y.o. gentleman with Stage T2a adenocarcinoma of the prostate with Gleason score of 3+4, and PSA of 5.75.  The patient is recovering from the effects of radiation. His urinary symptoms should gradually improve over the next 4-6 months. We talked about this today. He is encouraged by his improvement already and is otherwise pleased with his outcome. We also talked about long-term follow-up for prostate cancer following seed implant. He understands that ongoing PSA determinations and digital rectal exams will help perform surveillance to rule out disease recurrence. He has a follow up appointment scheduled with Bartholomew Crews, NP on 09/08/2023 and will see Dr. Annabell Howells in March 2025, following his first posttreatment PSA. He understands what to expect with his PSA measures. Patient was also educated today about some of the long-term effects from radiation including a small risk for rectal bleeding and possibly erectile dysfunction. We talked about some of the general management approaches to these potential complications. However, I did encourage the patient to contact our office or return at any point if he has questions or concerns related to his previous radiation and prostate cancer.    Marguarite Arbour, PA-C

## 2023-09-03 NOTE — Progress Notes (Signed)
  Radiation Oncology         [phone] ________________________________  Name: Bobby Munoz MRN: [id number]  Date: 09/03/2023  DOB: [birth year]  COMPLEX SIMULATION NOTE  NARRATIVE:  The patient was brought to the CT Simulation planning suite today following prostate seed implantation approximately one month ago.  Identity was confirmed.  All relevant records and images related to the planned course of therapy were reviewed.  Then, the patient was set-up supine.  CT images were obtained.  The CT images were loaded into the planning software.  Then the prostate and rectum were contoured.  Treatment planning then occurred.  The implanted iodine 125 seeds were identified by the physics staff for projection of radiation distribution  I have requested : 3D Simulation  I have requested a DVH of the following structures: Prostate and rectum.    ________________________________  Artist Pais Kathrynn Running, M.D.

## 2023-09-05 ENCOUNTER — Encounter: Payer: Self-pay | Admitting: Radiation Oncology

## 2023-09-05 DIAGNOSIS — C61 Malignant neoplasm of prostate: Secondary | ICD-10-CM | POA: Diagnosis not present

## 2023-09-05 DIAGNOSIS — Z191 Hormone sensitive malignancy status: Secondary | ICD-10-CM | POA: Diagnosis not present

## 2023-09-05 NOTE — Radiation Completion Notes (Addendum)
  Radiation Oncology         [phone] ________________________________  Name: BARUC TUGWELL MRN: [id number]  Date: 09/05/2023  DOB: 09/18/1955  Referring Physician: NORLEEN SELTZER, M.D. Date of Service: 2023-09-05 Radiation Oncologist: Adina Barge, M.D. [facility]     RADIATION ONCOLOGY END OF TREATMENT NOTE     Diagnosis: 67 y.o. gentleman with Stage T2a adenocarcinoma of the prostate with Gleason score of 3+4, and PSA of 5.75.   Intent: Curative     ==========DELIVERED PLANS==========  Prostate Seed Implant Date: 2023-08-19   Plan Name: Prostate Seed Implant Site: Prostate Technique: Radioactive Seed Implant I-125 Mode: Brachytherapy Dose Per Fraction: 145 Gy Prescribed Dose (Delivered / Prescribed): 145 Gy / 145 Gy Prescribed Fxs (Delivered / Prescribed): 1 / 1     ==========ON TREATMENT VISIT DATES========== 2023-08-19   ------------------------------------------------   Donnice Barge, MD [facility]  Radiation Oncology Direct Dial: [phone]  Fax: [phone] [email or web address]  Skype  LinkedIn

## 2023-09-05 NOTE — Progress Notes (Signed)
  Radiation Oncology         [phone] ________________________________  Name: TAVIONE REIGNER MRN: [id number]  Date: 09/05/2023  DOB: 03-03-1956  3D Planning Note   Prostate Brachytherapy Post-Implant Dosimetry  Diagnosis:  67 y.o. gentleman with Stage T2a adenocarcinoma of the prostate with Gleason score of 3+4, and PSA of 5.75.   Narrative: On a previous date, ELRY SLUYTER returned following prostate seed implantation for post implant planning. He underwent CT scan complex simulation to delineate the three-dimensional structures of the pelvis and demonstrate the radiation distribution.  Since that time, the seed localization, and complex isodose planning with dose volume histograms have now been completed.  Results:   Prostate Coverage - The dose of radiation delivered to the 90% or more of the prostate gland (D90) was 107.44% of the prescription dose. This exceeds our goal of greater than 90%. Rectal Sparing - The volume of rectal tissue receiving the prescription dose or higher was 0.0 cc. This falls under our thresholds tolerance of 1.0 cc.  Impression: The prostate seed implant appears to show adequate target coverage and appropriate rectal sparing.  Plan:  The patient will continue to follow with urology for ongoing PSA determinations. I would anticipate a high likelihood for local tumor control with minimal risk for rectal morbidity.  ________________________________  Artist Pais Kathrynn Running, M.D.

## 2023-09-08 DIAGNOSIS — R3912 Poor urinary stream: Secondary | ICD-10-CM | POA: Diagnosis not present

## 2023-09-08 LAB — GENECONNECT MOLECULAR SCREEN: Genetic Analysis Overall Interpretation: NEGATIVE

## 2023-09-16 ENCOUNTER — Encounter: Payer: Self-pay | Admitting: *Deleted

## 2023-09-19 ENCOUNTER — Encounter (INDEPENDENT_AMBULATORY_CARE_PROVIDER_SITE_OTHER): Payer: Self-pay | Admitting: Adult Health

## 2023-09-24 ENCOUNTER — Encounter (INDEPENDENT_AMBULATORY_CARE_PROVIDER_SITE_OTHER): Payer: Self-pay | Admitting: Adult Health

## 2023-09-24 ENCOUNTER — Ambulatory Visit (INDEPENDENT_AMBULATORY_CARE_PROVIDER_SITE_OTHER): Payer: Medicare PPO | Admitting: Adult Health

## 2023-09-24 VITALS — BP 121/73 | HR 84 | Temp 98.4°F | Ht 66.0 in | Wt 171.0 lb

## 2023-09-24 DIAGNOSIS — E559 Vitamin D deficiency, unspecified: Secondary | ICD-10-CM | POA: Diagnosis not present

## 2023-09-24 DIAGNOSIS — Z6827 Body mass index (BMI) 27.0-27.9, adult: Secondary | ICD-10-CM | POA: Diagnosis not present

## 2023-09-24 DIAGNOSIS — Z7985 Long-term (current) use of injectable non-insulin antidiabetic drugs: Secondary | ICD-10-CM

## 2023-09-24 DIAGNOSIS — E669 Obesity, unspecified: Secondary | ICD-10-CM

## 2023-09-24 DIAGNOSIS — E1169 Type 2 diabetes mellitus with other specified complication: Secondary | ICD-10-CM | POA: Diagnosis not present

## 2023-09-24 DIAGNOSIS — Z6833 Body mass index (BMI) 33.0-33.9, adult: Secondary | ICD-10-CM

## 2023-09-24 MED ORDER — VITAMIN D (ERGOCALCIFEROL) 1.25 MG (50000 UNIT) PO CAPS: 50000.0000 [IU] | ORAL_CAPSULE | ORAL | 0 refills | Status: DC

## 2023-09-24 NOTE — Progress Notes (Signed)
 WEIGHT SUMMARY AND BIOMETRICS  Vitals Temp: 98.4 F (36.9 C) BP: 121/73 Pulse Rate: 84 SpO2: 97 %   Anthropometric Measurements Height: 5' 6 (1.676 m) Weight: 171 lb (77.6 kg) BMI (Calculated): 27.61 Weight at Last Visit: 171lb Weight Lost Since Last Visit: 0 Weight Gained Since Last Visit: 0 Starting Weight: 206lb Total Weight Loss (lbs): 35 lb (15.9 kg) Peak Weight: 215lb   Body Composition  Body Fat %: 28.3 % Fat Mass (lbs): 48.4 lbs Muscle Mass (lbs): 116.4 lbs Total Body Water  (lbs): 86 lbs Visceral Fat Rating : 16   Other Clinical Data Fasting: no Labs: no Today's Visit #: 35 Starting Date: 11/22/20    Chief Complaint:   OBESITY Bobby Munoz is here to discuss his progress with his obesity treatment plan. He is on the the Category 3 Plan and states he is following his eating plan approximately 70 % of the time. He states he is exercising Brisk Walking 60 minutes 5 times per week.   Interim History:  Since last OV at [facility] on 08/26/2023, following events: 1) Celebrated 67th Birthday on 09/01/2023 2) Adopted a new puppy, Milly- 44 week old mixed: Lab, Australian Shepherd, and [employer] They also have a 69 year old yellow lab, CiCi 110) his 21 year old mother is with him until [facility] Deb 2025  Reviewed Bioimpedance results with pt: Muscle Mass: +3.4 lbs Adipose Mass: -4.2 lbs  Subjective:   1. Type 2 diabetes mellitus with other specified complication, without long-term current use of insulin  (HCC) Lab Results  Component Value Date   HGBA1C 5.3 04/14/2023   HGBA1C 5.7 (H) 10/02/2022   HGBA1C 5.8 (H) 06/04/2022    He is currently on  Semaglutide , 1 MG/DOSE, 4 MG/3ML SOPN  Blood Glucose Monitoring Suppl DEVI  atorvastatin  (LIPITOR ) 80 MG tablet   Denies mass in neck, dysphagia, dyspepsia, persistent hoarseness, abdominal pain, or N/V/C  Per pt- copay on GLP-1 Rx increased to >$500/month= cost prohibitive Discussed Rx options: 1) complete Pt.  Assit. Program Paperwork for med coverage with Novo Nordisk 2) Convert back to oral Rybelsus   He would like to remain on injection therapy, paperwork provided to pt.  2. Vitamin D  deficiency  Latest Reference Range & Units 06/04/22 09:10 10/02/22 12:06 04/14/23 08:11 06/23/23 09:59  Vitamin D , 25-Hydroxy 30.0 - 100.0 ng/mL 64.4 34.8 47.6 43.8   He endorses stable energy levels. He is on weekly Ergocalciferol - denies N/V/Muscle Weakness  Assessment/Plan:   1. Type 2 diabetes mellitus with other specified complication, without long-term current use of insulin  (HCC) (Primary) Novo Nordisk Pt. Asst. Paperwork provided to pt He will complete his required portion, return packet to [facility] We will finalize and send to Novo Nordisk Continue weekly Ozempic  1mg  injections  2. Vitamin D  deficiency Refill - Vitamin D , Ergocalciferol , (DRISDOL ) 1.25 MG (50000 UNIT) CAPS capsule; Take 1 capsule (50,000 Units total) by mouth every 7 (seven) days.  Dispense: 8 capsule; Refill: 0  3. Obesity, current BMI 27.61  Bobby Munoz is currently in the action stage of change. As such, his goal is to continue with weight loss efforts. He has agreed to the Category 3 Plan.   Exercise goals: Older adults should follow the adult guidelines. When older adults cannot meet the adult guidelines, they should be as physically active as their abilities and conditions will allow.  Older adults should do exercises that maintain or improve balance if they are at risk of falling.  Older adults should determine their level  of effort for physical activity relative to their level of fitness.  Older adults with chronic conditions should understand whether and how their conditions affect their ability to do regular physical activity safely.  Behavioral modification strategies: increasing lean protein intake, decreasing simple carbohydrates, increasing vegetables, increasing water  intake, no skipping meals, meal planning and cooking  strategies, keeping healthy foods in the home, ways to avoid boredom eating, and planning for success.  Bobby Munoz has agreed to follow-up with our clinic in 4 weeks. He was informed of the importance of frequent follow-up visits to maximize his success with intensive lifestyle modifications for his multiple health conditions.   Objective:   Blood pressure 121/73, pulse 84, temperature 98.4 F (36.9 C), height 5' 6 (1.676 m), weight 171 lb (77.6 kg), SpO2 97%. Body mass index is 27.6 kg/m.  General: Cooperative, alert, well developed, in no acute distress. HEENT: Conjunctivae and lids unremarkable. Cardiovascular: Regular rhythm.  Lungs: Normal work of breathing. Neurologic: No focal deficits.   Lab Results  Component Value Date   CREATININE 0.80 08/19/2023   BUN 33 (H) 08/19/2023   NA 140 08/19/2023   K 5.1 08/19/2023   CL 109 08/19/2023   CO2 24 06/23/2023   Lab Results  Component Value Date   ALT 21 06/23/2023   AST 22 06/23/2023   ALKPHOS 62 06/23/2023   BILITOT 0.7 06/23/2023   Lab Results  Component Value Date   HGBA1C 5.3 04/14/2023   HGBA1C 5.7 (H) 10/02/2022   HGBA1C 5.8 (H) 06/04/2022   HGBA1C 5.7 (H) 01/31/2022   HGBA1C 5.9 (H) 10/04/2021   Lab Results  Component Value Date   INSULIN  48.6 (H) 04/14/2023   INSULIN  14.4 10/02/2022   INSULIN  14.5 06/04/2022   INSULIN  13.8 01/31/2022   INSULIN  13.5 10/04/2021   Lab Results  Component Value Date   TSH 2.370 11/22/2020   Lab Results  Component Value Date   CHOL 114 04/14/2023   HDL 55 04/14/2023   LDLCALC 40 04/14/2023   TRIG 104 04/14/2023   CHOLHDL 2.1 04/14/2023   Lab Results  Component Value Date   VD25OH 43.8 06/23/2023   VD25OH 47.6 04/14/2023   VD25OH 34.8 10/02/2022   Lab Results  Component Value Date   WBC 9.8 11/22/2020   HGB 14.6 08/19/2023   HCT 43.0 08/19/2023   MCV 97 11/22/2020   PLT 282 11/22/2020   No results found for: IRON, TIBC, FERRITIN  Attestation Statements:    Reviewed by clinician on day of visit: allergies, medications, problem list, medical history, surgical history, family history, social history, and previous encounter notes.  I have reviewed the above documentation for accuracy and completeness, and I agree with the above. -  Cashlyn Huguley d. Dasie Chancellor, NP-C

## 2023-09-29 ENCOUNTER — Encounter: Payer: Self-pay | Admitting: *Deleted

## 2023-09-29 ENCOUNTER — Encounter: Payer: Self-pay | Admitting: Gastroenterology

## 2023-09-29 ENCOUNTER — Inpatient Hospital Stay: Payer: Medicare PPO | Attending: Adult Health | Admitting: *Deleted

## 2023-09-29 DIAGNOSIS — C61 Malignant neoplasm of prostate: Secondary | ICD-10-CM

## 2023-09-29 NOTE — Progress Notes (Signed)
 SCP reviewed and completed. Post -tx PSA will be done at [facility] in March. Vaccines UTD. Last colonoscopy was 09/28/2021. Next due 09/2031. A copy of prostate cancer summary has been sent to PCP.

## 2023-10-08 ENCOUNTER — Encounter (INDEPENDENT_AMBULATORY_CARE_PROVIDER_SITE_OTHER): Payer: Self-pay

## 2023-10-15 ENCOUNTER — Telehealth (INDEPENDENT_AMBULATORY_CARE_PROVIDER_SITE_OTHER): Payer: Self-pay

## 2023-10-15 NOTE — Telephone Encounter (Signed)
Called the patient at 8 this morning and left a message for him to return my call. I need him to come sign his Patient Assistant package so I can fax it. As of 2:07 he has not returned my call.

## 2023-10-15 NOTE — Telephone Encounter (Signed)
Called patient a 2nd time and left message about signing his patient assistance package.

## 2023-10-22 ENCOUNTER — Encounter (INDEPENDENT_AMBULATORY_CARE_PROVIDER_SITE_OTHER): Payer: Self-pay | Admitting: Adult Health

## 2023-10-22 ENCOUNTER — Ambulatory Visit (INDEPENDENT_AMBULATORY_CARE_PROVIDER_SITE_OTHER): Payer: Medicare PPO | Admitting: Adult Health

## 2023-10-22 VITALS — BP 109/73 | HR 54 | Temp 97.5°F | Ht 66.0 in | Wt 175.0 lb

## 2023-10-22 DIAGNOSIS — E559 Vitamin D deficiency, unspecified: Secondary | ICD-10-CM

## 2023-10-22 DIAGNOSIS — Z6828 Body mass index (BMI) 28.0-28.9, adult: Secondary | ICD-10-CM | POA: Diagnosis not present

## 2023-10-22 DIAGNOSIS — E1169 Type 2 diabetes mellitus with other specified complication: Secondary | ICD-10-CM

## 2023-10-22 DIAGNOSIS — E669 Obesity, unspecified: Secondary | ICD-10-CM | POA: Diagnosis not present

## 2023-10-22 DIAGNOSIS — Z6833 Body mass index (BMI) 33.0-33.9, adult: Secondary | ICD-10-CM

## 2023-10-22 DIAGNOSIS — C61 Malignant neoplasm of prostate: Secondary | ICD-10-CM | POA: Diagnosis not present

## 2023-10-22 DIAGNOSIS — Z7985 Long-term (current) use of injectable non-insulin antidiabetic drugs: Secondary | ICD-10-CM

## 2023-10-22 NOTE — Progress Notes (Signed)
 WEIGHT SUMMARY AND BIOMETRICS  Vitals Temp: (!) 97.5 F (36.4 C) BP: 109/73 Pulse Rate: (!) 54 SpO2: 99 %   Anthropometric Measurements Height: 5' 6 (1.676 m) Weight: 175 lb (79.4 kg) BMI (Calculated): 28.26 Weight at Last Visit: 171lb Weight Lost Since Last Visit: 0 Weight Gained Since Last Visit: 4lb Starting Weight: 206lb Total Weight Loss (lbs): 31 lb (14.1 kg) Peak Weight: 215lb   Body Composition  Body Fat %: 30.1 % Fat Mass (lbs): 52.8 lbs Muscle Mass (lbs): 116.4 lbs Total Body Water  (lbs): 84.6 lbs Visceral Fat Rating : 17   Other Clinical Data Fasting: no Labs: no Today's Visit #: 36 Starting Date: 11/22/20    Chief Complaint:   OBESITY Bobby Munoz is here to discuss his progress with his obesity treatment plan. He is on the the Category 3 Plan and states he is following his eating plan approximately 65 % of the time. He states he is exercising Walking 60 minutes 7 times per week.   Interim History:  Bobby Munoz just signed Pt. Assistance Program paperwork for Novo Nordisk- will fax off today. He was able to obtain Ozempic  1mg  refill- only cost $47/month He is injecting Ozempic  1mg  bi-weekly to preserve supply.  Hunger/appetite-stable  Cravings- none  Stress- home life stable. Prostate Ca treatment proceeding as planned.  Exercise-daily walks at least 60 mins, also home/yard work.  Hydration-he estimates to drink several quarts of water /day.  Subjective:   1. Malignant neoplasm of prostate (HCC) 09/2023 Urologist OV Note- SCP reviewed and completed. Post -tx PSA will be done at [facility] in March. Vaccines UTD. Last colonoscopy was 09/28/2021. Next due 09/2031. A copy of prostate cancer summary has been sent to PCP.  He denies frank pain or discomfort at present  2. Type 2 diabetes mellitus with other specified complication, without long-term current use of insulin  [facility]) Lab Results  Component Value Date   HGBA1C 5.3 04/14/2023   HGBA1C 5.7  (H) 10/02/2022   HGBA1C 5.8 (H) 06/04/2022    Home fasting CBG- has equipment, however not checking levels. He denies sx's of hypoglycemia He is on bi-weekly Ozempic  1mg  Denies mass in neck, dysphagia, dyspepsia, persistent hoarseness, abdominal pain, or N/V/C   3. Vit D Def He is on weekly Ergocalciferol - denies N/V/Muscle Weakness He endorses stable energy levels  Assessment/Plan:   1. Malignant neoplasm of prostate (HCC) (Primary) F/U with Urology as directed  2. Type 2 diabetes mellitus with other specified complication, without long-term current use of insulin  (HCC) Check Labs  3. Vit D Def Check Labs  4. Obesity, current BMI 28.26  Bobby Munoz is currently in the action stage of change. As such, his goal is to continue with weight loss efforts. He has agreed to the Category 3 Plan.   Exercise goals: Older adults should follow the adult guidelines. When older adults cannot meet the adult guidelines, they should be as physically active as their abilities and conditions will allow.  Older adults should do exercises that maintain or improve balance if they are at risk of falling.  Older adults should determine their level of effort for physical activity relative to their level of fitness.  Older adults with chronic conditions should understand whether and how their conditions affect their ability to do regular physical activity safely.  Behavioral modification strategies: increasing lean protein intake, decreasing simple carbohydrates, increasing vegetables, increasing water  intake, no skipping meals, meal planning and cooking strategies, keeping healthy foods in the home, ways to avoid  boredom eating, and planning for success.  Bobby Munoz has agreed to follow-up with our clinic in 4 weeks. He was informed of the importance of frequent follow-up visits to maximize his success with intensive lifestyle modifications for his multiple health conditions.   Bobby Munoz was informed we would discuss his  lab results at his next visit unless there is a critical issue that needs to be addressed sooner. Bobby Munoz agreed to keep his next visit at the agreed upon time to discuss these results.  Objective:   Blood pressure 109/73, pulse (!) 54, temperature (!) 97.5 F (36.4 C), height 5' 6 (1.676 m), weight 175 lb (79.4 kg), SpO2 99%. Body mass index is 28.25 kg/m.  General: Cooperative, alert, well developed, in no acute distress. HEENT: Conjunctivae and lids unremarkable. Cardiovascular: Regular rhythm.  Lungs: Normal work of breathing. Neurologic: No focal deficits.   Lab Results  Component Value Date   CREATININE 0.80 08/19/2023   BUN 33 (H) 08/19/2023   NA 140 08/19/2023   K 5.1 08/19/2023   CL 109 08/19/2023   CO2 24 06/23/2023   Lab Results  Component Value Date   ALT 21 06/23/2023   AST 22 06/23/2023   ALKPHOS 62 06/23/2023   BILITOT 0.7 06/23/2023   Lab Results  Component Value Date   HGBA1C 5.3 04/14/2023   HGBA1C 5.7 (H) 10/02/2022   HGBA1C 5.8 (H) 06/04/2022   HGBA1C 5.7 (H) 01/31/2022   HGBA1C 5.9 (H) 10/04/2021   Lab Results  Component Value Date   INSULIN  48.6 (H) 04/14/2023   INSULIN  14.4 10/02/2022   INSULIN  14.5 06/04/2022   INSULIN  13.8 01/31/2022   INSULIN  13.5 10/04/2021   Lab Results  Component Value Date   TSH 2.370 11/22/2020   Lab Results  Component Value Date   CHOL 114 04/14/2023   HDL 55 04/14/2023   LDLCALC 40 04/14/2023   TRIG 104 04/14/2023   CHOLHDL 2.1 04/14/2023   Lab Results  Component Value Date   VD25OH 43.8 06/23/2023   VD25OH 47.6 04/14/2023   VD25OH 34.8 10/02/2022   Lab Results  Component Value Date   WBC 9.8 11/22/2020   HGB 14.6 08/19/2023   HCT 43.0 08/19/2023   MCV 97 11/22/2020   PLT 282 11/22/2020   No results found for: IRON, TIBC, FERRITIN  Attestation Statements:   Reviewed by clinician on day of visit: allergies, medications, problem list, medical history, surgical history, family history,  social history, and previous encounter notes.  I have reviewed the above documentation for accuracy and completeness, and I agree with the above. -  Cedrica Brune d. Dilpreet Faires, NP-C

## 2023-10-23 LAB — HEMOGLOBIN A1C
Est. average glucose Bld gHb Est-mCnc: 117 mg/dL
Hgb A1c MFr Bld: 5.7 % — ABNORMAL HIGH (ref 4.8–5.6)

## 2023-10-23 LAB — VITAMIN D 25 HYDROXY (VIT D DEFICIENCY, FRACTURES): Vit D, 25-Hydroxy: 55.4 ng/mL (ref 30.0–100.0)

## 2023-11-19 DIAGNOSIS — M5416 Radiculopathy, lumbar region: Secondary | ICD-10-CM | POA: Diagnosis not present

## 2023-11-20 ENCOUNTER — Encounter (INDEPENDENT_AMBULATORY_CARE_PROVIDER_SITE_OTHER): Payer: Self-pay | Admitting: Adult Health

## 2023-11-20 ENCOUNTER — Ambulatory Visit (INDEPENDENT_AMBULATORY_CARE_PROVIDER_SITE_OTHER): Payer: Medicare PPO | Admitting: Adult Health

## 2023-11-20 VITALS — BP 139/82 | HR 78 | Temp 97.6°F | Ht 66.0 in | Wt 170.0 lb

## 2023-11-20 DIAGNOSIS — E1159 Type 2 diabetes mellitus with other circulatory complications: Secondary | ICD-10-CM

## 2023-11-20 DIAGNOSIS — E559 Vitamin D deficiency, unspecified: Secondary | ICD-10-CM

## 2023-11-20 DIAGNOSIS — Z6827 Body mass index (BMI) 27.0-27.9, adult: Secondary | ICD-10-CM | POA: Diagnosis not present

## 2023-11-20 DIAGNOSIS — I152 Hypertension secondary to endocrine disorders: Secondary | ICD-10-CM

## 2023-11-20 DIAGNOSIS — E669 Obesity, unspecified: Secondary | ICD-10-CM | POA: Diagnosis not present

## 2023-11-20 DIAGNOSIS — E1169 Type 2 diabetes mellitus with other specified complication: Secondary | ICD-10-CM | POA: Diagnosis not present

## 2023-11-20 DIAGNOSIS — E66811 Obesity, class 1: Secondary | ICD-10-CM

## 2023-11-20 DIAGNOSIS — Z7985 Long-term (current) use of injectable non-insulin antidiabetic drugs: Secondary | ICD-10-CM | POA: Diagnosis not present

## 2023-11-20 MED ORDER — SEMAGLUTIDE (1 MG/DOSE) 4 MG/3ML ~~LOC~~ SOPN: 1.0000 mg | PEN_INJECTOR | SUBCUTANEOUS | Status: DC

## 2023-11-20 MED ORDER — VITAMIN D (ERGOCALCIFEROL) 1.25 MG (50000 UNIT) PO CAPS
50000.0000 [IU] | ORAL_CAPSULE | ORAL | 0 refills | Status: DC
Start: 2023-11-20 — End: 2024-03-25

## 2023-11-20 NOTE — Progress Notes (Signed)
 WEIGHT SUMMARY AND BIOMETRICS  Vitals Temp: 97.6 F (36.4 C) BP: 139/82 Pulse Rate: 78 SpO2: 99 %   Anthropometric Measurements Height: 5\' 6"  (1.676 m) Weight: 170 lb (77.1 kg) BMI (Calculated): 27.45 Weight at Last Visit: 175 lb Weight Lost Since Last Visit: 5 lb Weight Gained Since Last Visit: 0 Starting Weight: 206 lb Total Weight Loss (lbs): 36 lb (16.3 kg) Peak Weight: 215 lb   Body Composition  Body Fat %: 28.7 % Fat Mass (lbs): 49 lbs Muscle Mass (lbs): 115.6 lbs Total Body Water (lbs): 82.8 lbs Visceral Fat Rating : 16   Other Clinical Data Fasting: no Labs: no Today's Visit #: 37 Starting Date: 11/22/20    Chief Complaint:   OBESITY Bobby Munoz is here to discuss his progress with his obesity treatment plan.  He is on the the Category 3 Plan and states he is following his eating plan approximately 70 % of the time.  He states he is exercising Walking 60 minutes 7 times per week.   Interim History:  Reviewed Bioimpedance results with pt: Muscle Mass: -0.8 lb Adipose Mass: - 3.8 lbs  Hunger/appetite-stable He has increased Ozempic 1mg  from bi-weekly to weekly Denies mass in neck, dysphagia, dyspepsia, persistent hoarseness, abdominal pain, or N/V/C   He and his wife will start increasing intake of lentils, chickpeas They want to increase non meat protein in diet  Subjective:   1. Vitamin D deficiency Discussed Labs  Latest Reference Range & Units 10/22/23 09:16  Vitamin D, 25-Hydroxy 30.0 - 100.0 ng/mL 55.4   Level is stable and at goal He is on weekly Ergocalciferol- denies N/V/Muscle Weakness  2. Type 2 diabetes mellitus with other specified complication, without long-term current use of insulin (HCC) Discussed Labs  Latest Reference Range & Units 10/22/23 09:16  Hemoglobin A1C 4.8 - 5.6 % 5.7 (H)  Est. average glucose Bld gHb Est-mCnc mg/dL 409  (H): Data is abnormally high  A1c at goal He denies sx's of hypogylcemia He has  increased Ozempic 1mg  from bi-weekly to weekly Denies mass in neck, dysphagia, dyspepsia, persistent hoarseness, abdominal pain, or N/V/C   3. Hypertension associated with diabetes (HCC) BP slightly elevated at OV He denies acute cardiac sx's He is on  nitroGLYCERIN (NITROSTAT) 0.4 MG SL tablet  amLODipine (NORVASC) 2.5 MG tablet  atorvastatin (LIPITOR) 80 MG tablet  ezetimibe (ZETIA) 10 MG tablet  Semaglutide, 1 MG/DOSE, 4 MG/3ML SOPN   Assessment/Plan:   1. Vitamin D deficiency Refill  Vitamin D, Ergocalciferol, (DRISDOL) 1.25 MG (50000 UNIT) CAPS capsule Take 1 capsule (50,000 Units total) by mouth every 7 (seven) days. Dispense: 8 capsule, Refills: 0 ordered   2. Type 2 diabetes mellitus with other specified complication, without long-term current use of insulin (HCC) Refill  Semaglutide, 1 MG/DOSE, 4 MG/3ML SOPN Inject 1 mg as directed once a week.   3. Hypertension associated with diabetes (HCC) Remain well hydrated with water  Continue with regular walking Continue nitroGLYCERIN (NITROSTAT) 0.4 MG SL tablet  amLODipine (NORVASC) 2.5 MG tablet  atorvastatin (LIPITOR) 80 MG tablet  ezetimibe (ZETIA) 10 MG tablet  Semaglutide, 1 MG/DOSE, 4 MG/3ML SOPN   4. Obesity, current BMI 27.6 (Primary)  Bobby Munoz is currently in the action stage of change. As such, his goal is to continue with weight loss efforts. He has agreed to the Category 3 Plan.   Exercise goals: Older adults should follow the adult guidelines. When older adults cannot meet the adult guidelines, they should  be as physically active as their abilities and conditions will allow.  Older adults should do exercises that maintain or improve balance if they are at risk of falling.  Older adults should determine their level of effort for physical activity relative to their level of fitness.  Older adults with chronic conditions should understand whether and how their conditions affect their ability to do regular physical  activity safely.  Behavioral modification strategies: increasing lean protein intake, decreasing simple carbohydrates, increasing vegetables, increasing water intake, no skipping meals, meal planning and cooking strategies, keeping healthy foods in the home, ways to avoid boredom eating, and planning for success.  Bobby Munoz has agreed to follow-up with our clinic in 4 weeks. He was informed of the importance of frequent follow-up visits to maximize his success with intensive lifestyle modifications for his multiple health conditions.   Objective:   Blood pressure 139/82, pulse 78, temperature 97.6 F (36.4 C), height 5\' 6"  (1.676 m), weight 170 lb (77.1 kg), SpO2 99%. Body mass index is 27.44 kg/m.  General: Cooperative, alert, well developed, in no acute distress. HEENT: Conjunctivae and lids unremarkable. Cardiovascular: Regular rhythm.  Lungs: Normal work of breathing. Neurologic: No focal deficits.   Lab Results  Component Value Date   CREATININE 0.80 08/19/2023   BUN 33 (H) 08/19/2023   NA 140 08/19/2023   K 5.1 08/19/2023   CL 109 08/19/2023   CO2 24 06/23/2023   Lab Results  Component Value Date   ALT 21 06/23/2023   AST 22 06/23/2023   ALKPHOS 62 06/23/2023   BILITOT 0.7 06/23/2023   Lab Results  Component Value Date   HGBA1C 5.7 (H) 10/22/2023   HGBA1C 5.3 04/14/2023   HGBA1C 5.7 (H) 10/02/2022   HGBA1C 5.8 (H) 06/04/2022   HGBA1C 5.7 (H) 01/31/2022   Lab Results  Component Value Date   INSULIN 48.6 (H) 04/14/2023   INSULIN 14.4 10/02/2022   INSULIN 14.5 06/04/2022   INSULIN 13.8 01/31/2022   INSULIN 13.5 10/04/2021   Lab Results  Component Value Date   TSH 2.370 11/22/2020   Lab Results  Component Value Date   CHOL 114 04/14/2023   HDL 55 04/14/2023   LDLCALC 40 04/14/2023   TRIG 104 04/14/2023   CHOLHDL 2.1 04/14/2023   Lab Results  Component Value Date   VD25OH 55.4 10/22/2023   VD25OH 43.8 06/23/2023   VD25OH 47.6 04/14/2023   Lab Results   Component Value Date   WBC 9.8 11/22/2020   HGB 14.6 08/19/2023   HCT 43.0 08/19/2023   MCV 97 11/22/2020   PLT 282 11/22/2020   No results found for: "IRON", "TIBC", "FERRITIN"  Attestation Statements:   Reviewed by clinician on day of visit: allergies, medications, problem list, medical history, surgical history, family history, social history, and previous encounter notes.  I have reviewed the above documentation for accuracy and completeness, and I agree with the above. -  Bobby Munoz d. Bobby Vigeant, NP-C

## 2023-11-24 DIAGNOSIS — C61 Malignant neoplasm of prostate: Secondary | ICD-10-CM | POA: Diagnosis not present

## 2023-12-01 DIAGNOSIS — R3912 Poor urinary stream: Secondary | ICD-10-CM | POA: Diagnosis not present

## 2023-12-01 DIAGNOSIS — N403 Nodular prostate with lower urinary tract symptoms: Secondary | ICD-10-CM | POA: Diagnosis not present

## 2023-12-01 DIAGNOSIS — C61 Malignant neoplasm of prostate: Secondary | ICD-10-CM | POA: Diagnosis not present

## 2023-12-30 ENCOUNTER — Ambulatory Visit (INDEPENDENT_AMBULATORY_CARE_PROVIDER_SITE_OTHER): Admitting: Adult Health

## 2023-12-30 ENCOUNTER — Encounter (INDEPENDENT_AMBULATORY_CARE_PROVIDER_SITE_OTHER): Payer: Self-pay | Admitting: Adult Health

## 2023-12-30 VITALS — BP 112/70 | HR 58 | Temp 98.5°F | Ht 66.0 in | Wt 170.0 lb

## 2023-12-30 DIAGNOSIS — E669 Obesity, unspecified: Secondary | ICD-10-CM

## 2023-12-30 DIAGNOSIS — I152 Hypertension secondary to endocrine disorders: Secondary | ICD-10-CM | POA: Diagnosis not present

## 2023-12-30 DIAGNOSIS — Z6833 Body mass index (BMI) 33.0-33.9, adult: Secondary | ICD-10-CM

## 2023-12-30 DIAGNOSIS — E559 Vitamin D deficiency, unspecified: Secondary | ICD-10-CM

## 2023-12-30 DIAGNOSIS — Z7985 Long-term (current) use of injectable non-insulin antidiabetic drugs: Secondary | ICD-10-CM

## 2023-12-30 DIAGNOSIS — Z6827 Body mass index (BMI) 27.0-27.9, adult: Secondary | ICD-10-CM

## 2023-12-30 DIAGNOSIS — E1169 Type 2 diabetes mellitus with other specified complication: Secondary | ICD-10-CM

## 2023-12-30 DIAGNOSIS — E1159 Type 2 diabetes mellitus with other circulatory complications: Secondary | ICD-10-CM | POA: Diagnosis not present

## 2023-12-30 MED ORDER — SEMAGLUTIDE (1 MG/DOSE) 4 MG/3ML ~~LOC~~ SOPN
1.0000 mg | PEN_INJECTOR | SUBCUTANEOUS | 0 refills | Status: DC
Start: 2023-12-30 — End: 2024-03-25

## 2023-12-30 NOTE — Progress Notes (Signed)
 WEIGHT SUMMARY AND BIOMETRICS  Vitals Temp: 98.5 F (36.9 C) BP: 112/70 Pulse Rate: (!) 58 SpO2: 98 %   Anthropometric Measurements Height: 5\' 6"  (1.676 m) Weight: 170 lb (77.1 kg) BMI (Calculated): 27.45 Weight at Last Visit: 170 lb Weight Lost Since Last Visit: 0 Weight Gained Since Last Visit: 0 Starting Weight: 206 lb Total Weight Loss (lbs): 36 lb (16.3 kg) Peak Weight: 0   Body Composition  Body Fat %: 28.5 % Fat Mass (lbs): 48.6 lbs Muscle Mass (lbs): 116 lbs Total Body Water (lbs): 83 lbs Visceral Fat Rating : 16   Other Clinical Data Fasting: no Labs: no Today's Visit #: 38 Starting Date: 11/22/20    Chief Complaint:   OBESITY Bobby Munoz is here to discuss his progress with his obesity treatment plan.  He is on the the Category 3 Plan and states he is following his eating plan approximately 70 % of the time.  He states he is exercising Walking 60 minutes 7 times per week.   Interim History:  He is currently on weekly Ozempic 1mg  Denies mass in neck, dysphagia, dyspepsia, persistent hoarseness, abdominal pain, or N/V/C   Reviewed Bioimpedance results with pt: Muscle Mass:+0.4 lb Adipose Mass:-0.4 lb  HWW completed Patient Assistance Program Paperwork for [employer] Mr. Mcglinn provided signatures where needed- submitted today  "CiCi"- 60.69 yr old yellow lab passed away a few weeks ago. Milly - 21 month old lab puppy has been a great support system  Subjective:   1. Type 2 diabetes mellitus with other specified complication, without long-term current use of insulin (HCC) Lab Results  Component Value Date   HGBA1C 5.7 (H) 10/22/2023   HGBA1C 5.3 04/14/2023   HGBA1C 5.7 (H) 10/02/2022     Latest Reference Range & Units 06/04/22 09:10 10/02/22 12:06 04/14/23 08:11  INSULIN 2.6 - 24.9 uIU/mL 14.5 14.4 48.6 (H)  (H): Data is abnormally high  He is on weekly Ozempic 1mg  Home fasting CBG- not checking He denies sx's of hypoglycemia Denies  mass in neck, dysphagia, dyspepsia, persistent hoarseness, abdominal pain, or N/V/C   2. Vitamin D deficiency  Latest Reference Range & Units 04/14/23 08:11 06/23/23 09:59 10/22/23 09:16  Vitamin D, 25-Hydroxy 30.0 - 100.0 ng/mL 47.6 43.8 55.4   He is on weekly Ergocalciferol- denies N/V/Muscle Weakness  3. Hypertension associated with diabetes (HCC) BP at goal at OV He denies CP with exertion, ie: brisk walking He denies tobacco/vape use  Assessment/Plan:   1. Type 2 diabetes mellitus with other specified complication, without long-term current use of insulin (HCC) Completed Patient Assistance Program Paperwork for [employer] Refill  Semaglutide, 1 MG/DOSE, 4 MG/3ML SOPN Inject 1 mg as directed once a week. Dispense: 9 mL, Refills: 0 ordered   2. Vitamin D deficiency Monitor Labs  3. Hypertension associated with diabetes (HCC) Limit Na+ Continue regular walking  4. Obesity, current BMI 27.6  Bobby Munoz is currently in the action stage of change. As such, his goal is to continue with weight loss efforts. He has agreed to the Category 3 Plan.   Exercise goals: Older adults should follow the adult guidelines. When older adults cannot meet the adult guidelines, they should be as physically active as their abilities and conditions will allow.  Older adults should do exercises that maintain or improve balance if they are at risk of falling.  Older adults should determine their level of effort for physical activity relative to their level of fitness.  Older adults  with chronic conditions should understand whether and how their conditions affect their ability to do regular physical activity safely.  Behavioral modification strategies: increasing water intake, decreasing liquid calories, decreasing alcohol intake, decreasing sodium intake, no skipping meals, meal planning and cooking strategies, keeping healthy foods in the home, ways to avoid boredom eating, and planning for success.  Bobby Munoz  has agreed to follow-up with our clinic in 4 weeks. He was informed of the importance of frequent follow-up visits to maximize his success with intensive lifestyle modifications for his multiple health conditions.   CHECK FASTING LABS AT NEXT OV  Objective:   Blood pressure 112/70, pulse (!) 58, temperature 98.5 F (36.9 C), height 5\' 6"  (1.676 m), weight 170 lb (77.1 kg), SpO2 98%. Body mass index is 27.44 kg/m.  General: Cooperative, alert, well developed, in no acute distress. HEENT: Conjunctivae and lids unremarkable. Cardiovascular: Regular rhythm.  Lungs: Normal work of breathing. Neurologic: No focal deficits.   Lab Results  Component Value Date   CREATININE 0.80 08/19/2023   BUN 33 (H) 08/19/2023   NA 140 08/19/2023   K 5.1 08/19/2023   CL 109 08/19/2023   CO2 24 06/23/2023   Lab Results  Component Value Date   ALT 21 06/23/2023   AST 22 06/23/2023   ALKPHOS 62 06/23/2023   BILITOT 0.7 06/23/2023   Lab Results  Component Value Date   HGBA1C 5.7 (H) 10/22/2023   HGBA1C 5.3 04/14/2023   HGBA1C 5.7 (H) 10/02/2022   HGBA1C 5.8 (H) 06/04/2022   HGBA1C 5.7 (H) 01/31/2022   Lab Results  Component Value Date   INSULIN 48.6 (H) 04/14/2023   INSULIN 14.4 10/02/2022   INSULIN 14.5 06/04/2022   INSULIN 13.8 01/31/2022   INSULIN 13.5 10/04/2021   Lab Results  Component Value Date   TSH 2.370 11/22/2020   Lab Results  Component Value Date   CHOL 114 04/14/2023   HDL 55 04/14/2023   LDLCALC 40 04/14/2023   TRIG 104 04/14/2023   CHOLHDL 2.1 04/14/2023   Lab Results  Component Value Date   VD25OH 55.4 10/22/2023   VD25OH 43.8 06/23/2023   VD25OH 47.6 04/14/2023   Lab Results  Component Value Date   WBC 9.8 11/22/2020   HGB 14.6 08/19/2023   HCT 43.0 08/19/2023   MCV 97 11/22/2020   PLT 282 11/22/2020   No results found for: "IRON", "TIBC", "FERRITIN"  Attestation Statements:   Reviewed by clinician on day of visit: allergies, medications, problem  list, medical history, surgical history, family history, social history, and previous encounter notes.  I have reviewed the above documentation for accuracy and completeness, and I agree with the above. -  Chaunta Bejarano d. Nabil Bubolz, NP-C

## 2024-01-12 DIAGNOSIS — M5416 Radiculopathy, lumbar region: Secondary | ICD-10-CM | POA: Diagnosis not present

## 2024-01-12 DIAGNOSIS — M47816 Spondylosis without myelopathy or radiculopathy, lumbar region: Secondary | ICD-10-CM | POA: Diagnosis not present

## 2024-01-29 DIAGNOSIS — R051 Acute cough: Secondary | ICD-10-CM | POA: Diagnosis not present

## 2024-01-29 DIAGNOSIS — J019 Acute sinusitis, unspecified: Secondary | ICD-10-CM | POA: Diagnosis not present

## 2024-02-04 ENCOUNTER — Encounter (INDEPENDENT_AMBULATORY_CARE_PROVIDER_SITE_OTHER): Payer: Self-pay | Admitting: Adult Health

## 2024-02-04 ENCOUNTER — Ambulatory Visit (INDEPENDENT_AMBULATORY_CARE_PROVIDER_SITE_OTHER): Admitting: Adult Health

## 2024-02-04 VITALS — BP 137/81 | HR 84 | Temp 98.2°F | Ht 66.0 in | Wt 163.0 lb

## 2024-02-04 DIAGNOSIS — E1169 Type 2 diabetes mellitus with other specified complication: Secondary | ICD-10-CM | POA: Diagnosis not present

## 2024-02-04 DIAGNOSIS — Z6826 Body mass index (BMI) 26.0-26.9, adult: Secondary | ICD-10-CM

## 2024-02-04 DIAGNOSIS — E66811 Obesity, class 1: Secondary | ICD-10-CM

## 2024-02-04 DIAGNOSIS — I152 Hypertension secondary to endocrine disorders: Secondary | ICD-10-CM

## 2024-02-04 DIAGNOSIS — E669 Obesity, unspecified: Secondary | ICD-10-CM | POA: Diagnosis not present

## 2024-02-04 DIAGNOSIS — Z7985 Long-term (current) use of injectable non-insulin antidiabetic drugs: Secondary | ICD-10-CM | POA: Diagnosis not present

## 2024-02-04 DIAGNOSIS — E559 Vitamin D deficiency, unspecified: Secondary | ICD-10-CM

## 2024-02-04 DIAGNOSIS — E1159 Type 2 diabetes mellitus with other circulatory complications: Secondary | ICD-10-CM | POA: Diagnosis not present

## 2024-02-04 DIAGNOSIS — E785 Hyperlipidemia, unspecified: Secondary | ICD-10-CM

## 2024-02-04 NOTE — Progress Notes (Signed)
 WEIGHT SUMMARY AND BIOMETRICS  Vitals Temp: 98.2 F (36.8 C) BP: 137/81 Pulse Rate: 84 SpO2: 100 %   Anthropometric Measurements Height: 5\' 6"  (1.676 m) Weight: 163 lb (73.9 kg) BMI (Calculated): 26.32 Weight at Last Visit: 170 lb Weight Lost Since Last Visit: 7 lb Weight Gained Since Last Visit: 0 Starting Weight: 206 lb Total Weight Loss (lbs): 43 lb (19.5 kg)   Body Composition  Body Fat %: 28.3 % Fat Mass (lbs): 46.2 lbs Muscle Mass (lbs): 111.2 lbs Total Body Water  (lbs): 78.2 lbs Visceral Fat Rating : 15   Other Clinical Data Fasting: yes Labs: yes Today's Visit #: 98 Starting Date: 11/19/20    Chief Complaint:   OBESITY Bobby Munoz is here to discuss his progress with his obesity treatment plan.  He is on the the Category 3 Plan and states he is following his eating plan approximately 70 % of the time.  He states he is exercising Walking 60 minutes 7 times per week.  Interim History:  Bobby Munoz has been experiencing URI sx's the last several weeks He was evaluated at [facility] on 01/29/2024- Provided Rx for Tessalon, Augmentin, and Medrol Dose Pak He completed Dose Pak yesterday- may account for elevated BP today  He is experiencing acute GU sx's- he plans on following up with his established Urologist  Subjective:   1. Type 2 diabetes mellitus with other specified complication, without long-term current use of insulin  (HCC) Lab Results  Component Value Date   HGBA1C 5.7 (H) 10/22/2023   HGBA1C 5.3 04/14/2023   HGBA1C 5.7 (H) 10/02/2022    He denies episodes of hypoglycemia He is on weekly Ozempic  1mg  Denies mass in neck, dysphagia, dyspepsia, persistent hoarseness, abdominal pain, or N/V/C   Of Note- He completed Medrol Dose Pak yesterday  2. Hyperlipidemia associated with type 2 diabetes mellitus (HCC) He is on max dose Atorvastatin  80mg  daily He denies myalgias Stat therapy managed by [facility]  3. Vitamin D  deficiency He endorses stable energy  levels He is on weekly Ergocalciferol - denies N/V/Muscle Weakness  4. Hypertension associated with diabetes (HCC) BP slightly elevated OV He completed Medrol Dose Pak yesterday- this may account for elevated BP today  He denies CP with exertion He denies tobacco/vape use He walks multiple miles daily, often with his lab puppy "Milly"  He is currently on amLODipine  (NORVASC ) 2.5 MG tablet  atorvastatin  (LIPITOR ) 80 MG tablet  ezetimibe  (ZETIA ) 10 MG tablet  Semaglutide , 1 MG/DOSE, 4 MG/3ML SOPN  Aspirin  81 MG CAPS  losartan  (COZAAR ) 25 MG tablet    Assessment/Plan:   1. Type 2 diabetes mellitus with other specified complication, without long-term current use of insulin  (HCC) (Primary) Check Labs - Hemoglobin A1c - Insulin , random Continue weekly GLP-1 therapy as directed  2. Hyperlipidemia associated with type 2 diabetes mellitus (HCC) Check Labs - Lipid panel  3. Vitamin D  deficiency Check Labs - VITAMIN D  25 Hydroxy (Vit-D Deficiency, Fractures)  4. Hypertension associated with diabetes (HCC) Check Labs - Comprehensive metabolic panel with GFR  5. Obesity, current BMI 26.4  Bobby Munoz is currently in the action stage of change. As such, his goal is to continue with weight loss efforts. He has agreed to the Category 3 Plan.   Exercise goals: Older adults should follow the adult guidelines. When older adults cannot meet the adult guidelines, they should be as physically active as their abilities and conditions will allow.  Older adults should do exercises that maintain or improve balance  if they are at risk of falling.  Older adults should determine their level of effort for physical activity relative to their level of fitness.  Older adults with chronic conditions should understand whether and how their conditions affect their ability to do regular physical activity safely.  Behavioral modification strategies: increasing lean protein intake, decreasing simple carbohydrates,  increasing vegetables, increasing water  intake, no skipping meals, meal planning and cooking strategies, keeping healthy foods in the home, and planning for success.  Bobby Munoz has agreed to follow-up with our clinic in 4 weeks. He was informed of the importance of frequent follow-up visits to maximize his success with intensive lifestyle modifications for his multiple health conditions.   Bobby Munoz was informed we would discuss his lab results at his next visit unless there is a critical issue that needs to be addressed sooner. Bobby Munoz agreed to keep his next visit at the agreed upon time to discuss these results.  Objective:   Blood pressure 137/81, pulse 84, temperature 98.2 F (36.8 C), height 5\' 6"  (1.676 m), weight 163 lb (73.9 kg), SpO2 100%. Body mass index is 26.31 kg/m.  General: Cooperative, alert, well developed, in no acute distress. HEENT: Conjunctivae and lids unremarkable. Cardiovascular: Regular rhythm.  Lungs: Normal work of breathing. Neurologic: No focal deficits.   Lab Results  Component Value Date   CREATININE 0.80 08/19/2023   BUN 33 (H) 08/19/2023   NA 140 08/19/2023   K 5.1 08/19/2023   CL 109 08/19/2023   CO2 24 06/23/2023   Lab Results  Component Value Date   ALT 21 06/23/2023   AST 22 06/23/2023   ALKPHOS 62 06/23/2023   BILITOT 0.7 06/23/2023   Lab Results  Component Value Date   HGBA1C 5.7 (H) 10/22/2023   HGBA1C 5.3 04/14/2023   HGBA1C 5.7 (H) 10/02/2022   HGBA1C 5.8 (H) 06/04/2022   HGBA1C 5.7 (H) 01/31/2022   Lab Results  Component Value Date   INSULIN  48.6 (H) 04/14/2023   INSULIN  14.4 10/02/2022   INSULIN  14.5 06/04/2022   INSULIN  13.8 01/31/2022   INSULIN  13.5 10/04/2021   Lab Results  Component Value Date   TSH 2.370 11/22/2020   Lab Results  Component Value Date   CHOL 114 04/14/2023   HDL 55 04/14/2023   LDLCALC 40 04/14/2023   TRIG 104 04/14/2023   CHOLHDL 2.1 04/14/2023   Lab Results  Component Value Date   VD25OH 55.4  10/22/2023   VD25OH 43.8 06/23/2023   VD25OH 47.6 04/14/2023   Lab Results  Component Value Date   WBC 9.8 11/22/2020   HGB 14.6 08/19/2023   HCT 43.0 08/19/2023   MCV 97 11/22/2020   PLT 282 11/22/2020   No results found for: "IRON", "TIBC", "FERRITIN"  Attestation Statements:   Reviewed by clinician on day of visit: allergies, medications, problem list, medical history, surgical history, family history, social history, and previous encounter notes.  I have reviewed the above documentation for accuracy and completeness, and I agree with the above. -  Courtnay Petrilla d. Earmon Sherrow, NP-C

## 2024-02-05 LAB — COMPREHENSIVE METABOLIC PANEL WITH GFR
ALT: 35 IU/L (ref 0–44)
AST: 20 IU/L (ref 0–40)
Albumin: 4.5 g/dL (ref 3.9–4.9)
Alkaline Phosphatase: 56 IU/L (ref 44–121)
BUN/Creatinine Ratio: 28 — ABNORMAL HIGH (ref 10–24)
BUN: 19 mg/dL (ref 8–27)
Bilirubin Total: 0.7 mg/dL (ref 0.0–1.2)
CO2: 22 mmol/L (ref 20–29)
Calcium: 9.3 mg/dL (ref 8.6–10.2)
Chloride: 98 mmol/L (ref 96–106)
Creatinine, Ser: 0.67 mg/dL — ABNORMAL LOW (ref 0.76–1.27)
Globulin, Total: 2.3 g/dL (ref 1.5–4.5)
Glucose: 104 mg/dL — ABNORMAL HIGH (ref 70–99)
Potassium: 4.3 mmol/L (ref 3.5–5.2)
Sodium: 137 mmol/L (ref 134–144)
Total Protein: 6.8 g/dL (ref 6.0–8.5)
eGFR: 102 mL/min/{1.73_m2} (ref >59)

## 2024-02-05 LAB — HEMOGLOBIN A1C
Est. average glucose Bld gHb Est-mCnc: 123 mg/dL
Hgb A1c MFr Bld: 5.9 % — ABNORMAL HIGH (ref 4.8–5.6)

## 2024-02-05 LAB — INSULIN, RANDOM: INSULIN: 14.1 u[IU]/mL (ref 2.6–24.9)

## 2024-02-05 LAB — LIPID PANEL
Chol/HDL Ratio: 2.3 ratio (ref 0.0–5.0)
Cholesterol, Total: 138 mg/dL (ref 100–199)
HDL: 61 mg/dL (ref >39)
LDL Chol Calc (NIH): 55 mg/dL (ref 0–99)
Triglycerides: 129 mg/dL (ref 0–149)
VLDL Cholesterol Cal: 22 mg/dL (ref 5–40)

## 2024-02-05 LAB — VITAMIN D 25 HYDROXY (VIT D DEFICIENCY, FRACTURES): Vit D, 25-Hydroxy: 50.6 ng/mL (ref 30.0–100.0)

## 2024-02-18 ENCOUNTER — Other Ambulatory Visit: Payer: Self-pay | Admitting: Interventional Cardiology

## 2024-02-24 ENCOUNTER — Other Ambulatory Visit: Payer: Self-pay | Admitting: Interventional Cardiology

## 2024-03-09 DIAGNOSIS — Z1283 Encounter for screening for malignant neoplasm of skin: Secondary | ICD-10-CM | POA: Diagnosis not present

## 2024-03-09 DIAGNOSIS — B078 Other viral warts: Secondary | ICD-10-CM | POA: Diagnosis not present

## 2024-03-09 DIAGNOSIS — C44619 Basal cell carcinoma of skin of left upper limb, including shoulder: Secondary | ICD-10-CM | POA: Diagnosis not present

## 2024-03-09 DIAGNOSIS — D225 Melanocytic nevi of trunk: Secondary | ICD-10-CM | POA: Diagnosis not present

## 2024-03-25 ENCOUNTER — Encounter (INDEPENDENT_AMBULATORY_CARE_PROVIDER_SITE_OTHER): Payer: Self-pay | Admitting: Adult Health

## 2024-03-25 ENCOUNTER — Telehealth (INDEPENDENT_AMBULATORY_CARE_PROVIDER_SITE_OTHER): Admitting: Adult Health

## 2024-03-25 VITALS — Ht 66.0 in

## 2024-03-25 DIAGNOSIS — Z7985 Long-term (current) use of injectable non-insulin antidiabetic drugs: Secondary | ICD-10-CM

## 2024-03-25 DIAGNOSIS — Z6826 Body mass index (BMI) 26.0-26.9, adult: Secondary | ICD-10-CM

## 2024-03-25 DIAGNOSIS — E66811 Obesity, class 1: Secondary | ICD-10-CM

## 2024-03-25 DIAGNOSIS — E559 Vitamin D deficiency, unspecified: Secondary | ICD-10-CM

## 2024-03-25 DIAGNOSIS — E669 Obesity, unspecified: Secondary | ICD-10-CM

## 2024-03-25 DIAGNOSIS — E785 Hyperlipidemia, unspecified: Secondary | ICD-10-CM

## 2024-03-25 DIAGNOSIS — E1169 Type 2 diabetes mellitus with other specified complication: Secondary | ICD-10-CM

## 2024-03-25 MED ORDER — SEMAGLUTIDE (1 MG/DOSE) 4 MG/3ML ~~LOC~~ SOPN
1.0000 mg | PEN_INJECTOR | SUBCUTANEOUS | 0 refills | Status: DC
Start: 2024-03-25 — End: 2024-08-04

## 2024-03-25 MED ORDER — VITAMIN D (ERGOCALCIFEROL) 1.25 MG (50000 UNIT) PO CAPS
50000.0000 [IU] | ORAL_CAPSULE | ORAL | 0 refills | Status: DC
Start: 2024-03-25 — End: 2024-06-08

## 2024-03-25 NOTE — Progress Notes (Signed)
 WEIGHT SUMMARY AND BIOMETRICS  No data recorded Anthropometric Measurements Height: 5' 6 (1.676 m)   No data recorded Other Clinical Data Fasting: no Labs: no Today's Visit #: 40 Comments: video visit    Chief Complaint:  I connected with  Bobby Munoz on 03/25/24 by a video and audio enabled telemedicine application and verified that I am speaking with the correct person using two identifiers.  Patient Location: Home  Provider Location: Office/Clinic  I discussed the limitations of evaluation and management by telemedicine. The patient expressed understanding and agreed to proceed.  OBESITY Aly is here to discuss his progress with his obesity treatment plan. He is on the the Category 3 Plan and states he is following his eating plan approximately 70 % of the time.  He states he is exercising Walking 120 minutes 7 times per week.  Interim History:  Due to lack of transportation (car would not start)- OV was converted to MyChart Video visit Home weight 171 lbs today, which equates to BMI 27.61  Reviewed   Subjective:   1. Type 2 diabetes mellitus with other specified complication, without long-term current use of insulin  (HCC) Discussed Labs  Latest Reference Range & Units 02/04/24 08:41  Glucose 70 - 99 mg/dL 895 (H)  Hemoglobin J8R 4.8 - 5.6 % 5.9 (H)  Est. average glucose Bld gHb Est-mCnc mg/dL 876  INSULIN  2.6 - 24.9 uIU/mL 14.1  (H): Data is abnormally high  CBG slightly elevated A1c at goal Insulin  level greatly improved, however still above goal of 5 He is on weekly Ozempic  1mg  Denies mass in neck, dysphagia, dyspepsia, persistent hoarseness, abdominal pain, or N/V/C  He is not checking home CBG, however denies sx's of hypoglycemia  02/04/2024 CMP- Kidney fx stable  2. Hyperlipidemia associated with type 2 diabetes mellitus [facility]) Discussed Labs Lipid Panel     Component Value Date/Time   CHOL 138 02/04/2024 0841   TRIG 129 02/04/2024 0841    HDL 61 02/04/2024 0841   CHOLHDL 2.3 02/04/2024 0841   CHOLHDL 3.4 02/15/2016 1119   VLDL 31 (H) 02/15/2016 1119   LDLCALC 55 02/04/2024 0841   LABVLDL 22 02/04/2024 0841    4/78/79774 CMP- Liver enzymes are normal  Lipid panel stable and LDL at goal for diabetic  He is currently on nitroGLYCERIN  (NITROSTAT ) 0.4 MG SL tablet  atorvastatin  (LIPITOR ) 80 MG tablet  ezetimibe  (ZETIA ) 10 MG tablet  Aspirin  81 MG CAPS  losartan  (COZAAR ) 25 MG tablet  amLODipine  (NORVASC ) 2.5 MG tablet   3. Vitamin D  deficiency Discussed Labs  Latest Reference Range & Units 02/04/24 08:41  Vitamin D , 25-Hydroxy 30.0 - 100.0 ng/mL 50.6   Vit D Level at goal He is on weekly Ergocalciferol - denies N/V/Muscle Weakness  Assessment/Plan:   1. Type 2 diabetes mellitus with other specified complication, without long-term current use of insulin  (HCC) (Primary) Refill  Semaglutide , 1 MG/DOSE, 4 MG/3ML SOPN   2. Hyperlipidemia associated with type 2 diabetes mellitus (HCC) Continue nitroGLYCERIN  (NITROSTAT ) 0.4 MG SL tablet  atorvastatin  (LIPITOR ) 80 MG tablet  ezetimibe  (ZETIA ) 10 MG tablet  Aspirin  81 MG CAPS  losartan  (COZAAR ) 25 MG tablet  amLODipine  (NORVASC ) 2.5 MG tablet   Continue regular walking  3. Vitamin D  deficiency Refill  Vitamin D , Ergocalciferol , (DRISDOL ) 1.25 MG (50000 UNIT) CAPS capsule Take 1 capsule (50,000 Units total) by mouth every 7 (seven) days. Dispense: 8 capsule, Refills: 0 ordered   03/25/2024 -- Jonel Rockie BIRCH, NP            []   4. Obesity, current BMI 26.4  [name] is currently in the action stage of change. As such, his goal is to continue with weight loss efforts. He has agreed to the Category 3 Plan.   Exercise goals: Older adults should follow the adult guidelines. When older adults cannot meet the adult guidelines, they should be as physically active as their abilities and conditions will allow.  Older adults should do exercises that maintain  or improve balance if they are at risk of falling.  Older adults should determine their level of effort for physical activity relative to their level of fitness.  Older adults with chronic conditions should understand whether and how their conditions affect their ability to do regular physical activity safely. Daily Walking  Behavioral modification strategies: increasing lean protein intake, decreasing simple carbohydrates, increasing vegetables, increasing water  intake, decreasing eating out, no skipping meals, meal planning and cooking strategies, keeping healthy foods in the home, ways to avoid boredom eating, travel eating strategies, and planning for success.  Eddie has agreed to follow-up with our clinic in 4 weeks. He was informed of the importance of frequent follow-up visits to maximize his success with intensive lifestyle modifications for his multiple health conditions.   Objective:   Height 5' 6 (1.676 m). Body mass index is 26.31 kg/m.  General: Cooperative, alert, well developed, in no acute distress. HEENT: Conjunctivae and lids unremarkable. Cardiovascular: Regular rhythm.  Lungs: Normal work of breathing. Neurologic: No focal deficits.   Lab Results  Component Value Date   CREATININE 0.67 (L) 02/04/2024   BUN 19 02/04/2024   NA 137 02/04/2024   K 4.3 02/04/2024   CL 98 02/04/2024   CO2 22 02/04/2024   Lab Results  Component Value Date   ALT 35 02/04/2024   AST 20 02/04/2024   ALKPHOS 56 02/04/2024   BILITOT 0.7 02/04/2024   Lab Results  Component Value Date   HGBA1C 5.9 (H) 02/04/2024   HGBA1C 5.7 (H) 10/22/2023   HGBA1C 5.3 04/14/2023   HGBA1C 5.7 (H) 10/02/2022   HGBA1C 5.8 (H) 06/04/2022   Lab Results  Component Value Date   INSULIN  14.1 02/04/2024   INSULIN  48.6 (H) 04/14/2023   INSULIN  14.4 10/02/2022   INSULIN  14.5 06/04/2022   INSULIN  13.8 01/31/2022   Lab Results  Component Value Date   TSH 2.370 11/22/2020   Lab Results  Component  Value Date   CHOL 138 02/04/2024   HDL 61 02/04/2024   LDLCALC 55 02/04/2024   TRIG 129 02/04/2024   CHOLHDL 2.3 02/04/2024   Lab Results  Component Value Date   VD25OH 50.6 02/04/2024   VD25OH 55.4 10/22/2023   VD25OH 43.8 06/23/2023   Lab Results  Component Value Date   WBC 9.8 11/22/2020   HGB 14.6 08/19/2023   HCT 43.0 08/19/2023   MCV 97 11/22/2020   PLT 282 11/22/2020   No results found for: IRON, TIBC, FERRITIN  Attestation Statements:   Reviewed by clinician on day of visit: allergies, medications, problem list, medical history, surgical history, family history, social history, and previous encounter notes.  I have reviewed the above documentation for accuracy and completeness, and I agree with the above. -  Cristyn Crossno d. Latisha Lasch, NP-C

## 2024-04-20 DIAGNOSIS — Z1331 Encounter for screening for depression: Secondary | ICD-10-CM | POA: Diagnosis not present

## 2024-04-20 DIAGNOSIS — Z0000 Encounter for general adult medical examination without abnormal findings: Secondary | ICD-10-CM | POA: Diagnosis not present

## 2024-04-22 DIAGNOSIS — Z8546 Personal history of malignant neoplasm of prostate: Secondary | ICD-10-CM | POA: Diagnosis not present

## 2024-04-22 DIAGNOSIS — R7303 Prediabetes: Secondary | ICD-10-CM | POA: Diagnosis not present

## 2024-04-22 DIAGNOSIS — H9113 Presbycusis, bilateral: Secondary | ICD-10-CM | POA: Diagnosis not present

## 2024-04-22 DIAGNOSIS — M47816 Spondylosis without myelopathy or radiculopathy, lumbar region: Secondary | ICD-10-CM | POA: Diagnosis not present

## 2024-04-22 DIAGNOSIS — K219 Gastro-esophageal reflux disease without esophagitis: Secondary | ICD-10-CM | POA: Diagnosis not present

## 2024-04-22 DIAGNOSIS — I10 Essential (primary) hypertension: Secondary | ICD-10-CM | POA: Diagnosis not present

## 2024-04-22 DIAGNOSIS — Z6828 Body mass index (BMI) 28.0-28.9, adult: Secondary | ICD-10-CM | POA: Diagnosis not present

## 2024-04-22 DIAGNOSIS — F5101 Primary insomnia: Secondary | ICD-10-CM | POA: Diagnosis not present

## 2024-04-22 DIAGNOSIS — F411 Generalized anxiety disorder: Secondary | ICD-10-CM | POA: Diagnosis not present

## 2024-04-27 DIAGNOSIS — Z85828 Personal history of other malignant neoplasm of skin: Secondary | ICD-10-CM | POA: Diagnosis not present

## 2024-04-27 DIAGNOSIS — Z08 Encounter for follow-up examination after completed treatment for malignant neoplasm: Secondary | ICD-10-CM | POA: Diagnosis not present

## 2024-05-13 NOTE — Telephone Encounter (Signed)
 I can to see him sooner than October.  If I have no availability, then okay to see APP.  Thanks MJP

## 2024-05-13 NOTE — Telephone Encounter (Signed)
**Note De-identified  [place] Obfuscation** Please advise 

## 2024-05-14 NOTE — Telephone Encounter (Signed)
 Happy to discuss during an office visit, whenever that will be.  Thanks MJP

## 2024-05-19 ENCOUNTER — Ambulatory Visit: Admitting: Internal Medicine

## 2024-05-24 ENCOUNTER — Ambulatory Visit (INDEPENDENT_AMBULATORY_CARE_PROVIDER_SITE_OTHER): Admitting: Adult Health

## 2024-05-24 DIAGNOSIS — C61 Malignant neoplasm of prostate: Secondary | ICD-10-CM | POA: Diagnosis not present

## 2024-05-24 DIAGNOSIS — N403 Nodular prostate with lower urinary tract symptoms: Secondary | ICD-10-CM | POA: Diagnosis not present

## 2024-05-25 ENCOUNTER — Other Ambulatory Visit (INDEPENDENT_AMBULATORY_CARE_PROVIDER_SITE_OTHER): Payer: Self-pay | Admitting: Adult Health

## 2024-05-25 ENCOUNTER — Other Ambulatory Visit: Payer: Self-pay | Admitting: Cardiology

## 2024-05-25 DIAGNOSIS — E559 Vitamin D deficiency, unspecified: Secondary | ICD-10-CM

## 2024-06-01 DIAGNOSIS — M47816 Spondylosis without myelopathy or radiculopathy, lumbar region: Secondary | ICD-10-CM | POA: Diagnosis not present

## 2024-06-08 ENCOUNTER — Encounter (INDEPENDENT_AMBULATORY_CARE_PROVIDER_SITE_OTHER): Payer: Self-pay | Admitting: Adult Health

## 2024-06-08 ENCOUNTER — Ambulatory Visit (INDEPENDENT_AMBULATORY_CARE_PROVIDER_SITE_OTHER): Admitting: Adult Health

## 2024-06-08 VITALS — BP 139/80 | HR 83 | Temp 98.7°F | Ht 66.0 in | Wt 166.0 lb

## 2024-06-08 DIAGNOSIS — E1159 Type 2 diabetes mellitus with other circulatory complications: Secondary | ICD-10-CM

## 2024-06-08 DIAGNOSIS — E66811 Obesity, class 1: Secondary | ICD-10-CM

## 2024-06-08 DIAGNOSIS — E1169 Type 2 diabetes mellitus with other specified complication: Secondary | ICD-10-CM | POA: Diagnosis not present

## 2024-06-08 DIAGNOSIS — C61 Malignant neoplasm of prostate: Secondary | ICD-10-CM

## 2024-06-08 DIAGNOSIS — Z7985 Long-term (current) use of injectable non-insulin antidiabetic drugs: Secondary | ICD-10-CM | POA: Diagnosis not present

## 2024-06-08 DIAGNOSIS — E559 Vitamin D deficiency, unspecified: Secondary | ICD-10-CM | POA: Diagnosis not present

## 2024-06-08 DIAGNOSIS — I152 Hypertension secondary to endocrine disorders: Secondary | ICD-10-CM | POA: Diagnosis not present

## 2024-06-08 DIAGNOSIS — Z6827 Body mass index (BMI) 27.0-27.9, adult: Secondary | ICD-10-CM | POA: Diagnosis not present

## 2024-06-08 DIAGNOSIS — E669 Obesity, unspecified: Secondary | ICD-10-CM

## 2024-06-08 MED ORDER — VITAMIN D (ERGOCALCIFEROL) 1.25 MG (50000 UNIT) PO CAPS
50000.0000 [IU] | ORAL_CAPSULE | ORAL | 0 refills | Status: DC
Start: 2024-06-08 — End: 2024-08-03

## 2024-06-08 NOTE — Progress Notes (Signed)
 WEIGHT SUMMARY AND BIOMETRICS  Vitals Temp: 98.7 F (37.1 C) BP: 139/80 Pulse Rate: 83 SpO2: 100 %   Anthropometric Measurements Height: 5' 6 (1.676 m) Weight: 166 lb (75.3 kg) BMI (Calculated): 26.81 Weight at Last Visit: 163lb Weight Lost Since Last Visit: 3lb Weight Gained Since Last Visit: 0lb Starting Weight: 206 Total Weight Loss (lbs): 40 lb (18.1 kg) Peak Weight: 215lb   Body Composition  Body Fat %: 26.7 % Fat Mass (lbs): 44.4 lbs Muscle Mass (lbs): 116 lbs Total Body Water  (lbs): 81.4 lbs Visceral Fat Rating : 15   Other Clinical Data RMR: [id number] Fasting: No Labs: no Today's Visit #: 41 Starting Date: 11/19/20    Chief Complaint:   OBESITY Bobby Munoz is here to discuss his progress with his obesity treatment plan.  He is on the the Category 3 Plan and states he is following his eating plan approximately 75 % of the time.  He states he is exercising Walking and Yardwork 60/120 minutes 7/7 times per week.  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  He also continues to walk his dog 4-5 miles daily!  He endorses increased chronic lumbar back pain- followed by [facility]  He had routine labs with established Oncologists- will have OV in Oct 2025 Unable to locate labs in [facility] He denies acute GU sx's at present  He is enjoying his retirement and serving on the [employer]- they are planning a large event slated for Oct 2026  Subjective:   1. Type 2 diabetes mellitus with other specified complication, without long-term current use of insulin  (HCC) Lab Results  Component Value Date   HGBA1C 5.9 (H) 02/04/2024   HGBA1C 5.7 (H) 10/22/2023   HGBA1C 5.3 04/14/2023    He has not been checking CBG at home He denies sx's of hypoglycemia He is  on weekly Ozempic  1mg  He administers on Sunday Denies mass in neck, dysphagia, dyspepsia, persistent hoarseness, abdominal pain, or N/V/C   2. Hypertension associated with diabetes Bobby Munoz) Reviewed MyChart exchange between pt and his established Cardiologist, re: elevated home BP readings. It was determined that his home cuff was providing erronoulsy elevated readings. He has purchased a new home BP cuff and reported readings: SBP: 130s DBP: 70-80s He is currently on: nitroGLYCERIN  (NITROSTAT ) 0.4 MG SL tablet  atorvastatin  (LIPITOR ) 80 MG tablet  ezetimibe  (ZETIA ) 10 MG tablet  losartan  (COZAAR ) 25 MG tablet  Semaglutide , 1 MG/DOSE, 4 MG/3ML SOPN  amLODipine  (NORVASC ) 2.5 MG tablet   3. Malignant neoplasm of prostate Bobby Munoz) He had routine labs with established Oncologists- will have OV in Oct 2025 Unable to locate labs in Bobby Munoz He denies acute GU sx's at present  4. Vitamin D  deficiency  Latest Reference Range & Units 06/23/23 09:59 10/22/23 09:16 02/04/24 08:41  Vitamin D , 25-Hydroxy 30.0 - 100.0 ng/mL 43.8 55.4 50.6   He endorses stable energy levels He is on weekly Ergocalciferol - denies N/V/Muscle Weakness  Assessment/Plan:   1. Type 2 diabetes mellitus with other specified complication, without long-term current use of insulin  (HCC) Continue healthy eating and regular cardiovascular exercise Monitor for sx's of hypoglycemia- information sheet provided to pt  2. Hypertension associated with diabetes (HCC) Limit Na+ Continue healthy eating and regular cardiovascular exercise Continue  nitroGLYCERIN  (NITROSTAT ) 0.4 MG SL tablet  atorvastatin  (LIPITOR ) 80  MG tablet  ezetimibe  (ZETIA ) 10 MG tablet  losartan  (COZAAR ) 25 MG tablet  Semaglutide , 1 MG/DOSE, 4 MG/3ML SOPN  amLODipine  (NORVASC ) 2.5 MG tablet   3. Malignant neoplasm of prostate (HCC) F/u with Oncology team as directed  4. Vitamin D  deficiency Refill - Vitamin D , Ergocalciferol , (DRISDOL ) 1.25 MG  (50000 UNIT) CAPS capsule; Take 1 capsule (50,000 Units total) by mouth every 7 (seven) days.  Dispense: 8 capsule; Refill: 0  5. Obesity, current BMI 27.9  Bobby Munoz is currently in the action stage of change. As such, his goal is to continue with weight loss efforts. He has agreed to the Category 3 Plan.   Exercise goals: Older adults should follow the adult guidelines. When older adults cannot meet the adult guidelines, they should be as physically active as their abilities and conditions will allow.  Older adults should do exercises that maintain or improve balance if they are at risk of falling.  Older adults should determine their level of effort for physical activity relative to their level of fitness.  Older adults with chronic conditions should understand whether and how their conditions affect their ability to do regular physical activity safely.  Behavioral modification strategies: increasing lean protein intake, decreasing simple carbohydrates, increasing vegetables, increasing water  intake, meal planning and cooking strategies, keeping healthy foods in the home, ways to avoid boredom eating, ways to avoid night time snacking, and planning for success.  Bobby Munoz has agreed to follow-up with our clinic in 4 weeks. He was informed of the importance of frequent follow-up visits to maximize his success with intensive lifestyle modifications for his multiple health conditions.   Objective:   Blood pressure 139/80, pulse 83, temperature 98.7 F (37.1 C), height 5' 6 (1.676 m), weight 166 lb (75.3 kg), SpO2 100%. Body mass index is 26.79 kg/m.  General: Cooperative, alert, well developed, in no acute distress. HEENT: Conjunctivae and lids unremarkable. Cardiovascular: Regular rhythm.  Lungs: Normal work of breathing. Neurologic: No focal deficits.   Lab Results  Component Value Date   CREATININE 0.67 (L) 02/04/2024   BUN 19 02/04/2024   NA 137 02/04/2024   K 4.3 02/04/2024   CL 98  02/04/2024   CO2 22 02/04/2024   Lab Results  Component Value Date   ALT 35 02/04/2024   AST 20 02/04/2024   ALKPHOS 56 02/04/2024   BILITOT 0.7 02/04/2024   Lab Results  Component Value Date   HGBA1C 5.9 (H) 02/04/2024   HGBA1C 5.7 (H) 10/22/2023   HGBA1C 5.3 04/14/2023   HGBA1C 5.7 (H) 10/02/2022   HGBA1C 5.8 (H) 06/04/2022   Lab Results  Component Value Date   INSULIN  14.1 02/04/2024   INSULIN  48.6 (H) 04/14/2023   INSULIN  14.4 10/02/2022   INSULIN  14.5 06/04/2022   INSULIN  13.8 01/31/2022   Lab Results  Component Value Date   TSH 2.370 11/22/2020   Lab Results  Component Value Date   CHOL 138 02/04/2024   HDL 61 02/04/2024   LDLCALC 55 02/04/2024   TRIG 129 02/04/2024   CHOLHDL 2.3 02/04/2024   Lab Results  Component Value Date   VD25OH 50.6 02/04/2024   VD25OH 55.4 10/22/2023   VD25OH 43.8 06/23/2023   Lab Results  Component Value Date   WBC 9.8 11/22/2020   HGB 14.6 08/19/2023   HCT 43.0 08/19/2023   MCV 97 11/22/2020   PLT 282 11/22/2020   No results found for: IRON, TIBC, FERRITIN  Attestation Statements:   Reviewed by clinician on day  of visit: allergies, medications, problem list, medical history, surgical history, family history, social history, and previous encounter notes.  I have reviewed the above documentation for accuracy and completeness, and I agree with the above. -  Tayden Nichelson d. Jovi Zavadil, NP-C

## 2024-07-06 ENCOUNTER — Encounter (INDEPENDENT_AMBULATORY_CARE_PROVIDER_SITE_OTHER): Payer: Self-pay | Admitting: Adult Health

## 2024-07-06 ENCOUNTER — Ambulatory Visit (INDEPENDENT_AMBULATORY_CARE_PROVIDER_SITE_OTHER): Admitting: Adult Health

## 2024-07-06 VITALS — BP 136/84 | HR 59 | Temp 98.7°F | Ht 66.0 in | Wt 168.0 lb

## 2024-07-06 DIAGNOSIS — E669 Obesity, unspecified: Secondary | ICD-10-CM

## 2024-07-06 DIAGNOSIS — Z6827 Body mass index (BMI) 27.0-27.9, adult: Secondary | ICD-10-CM | POA: Diagnosis not present

## 2024-07-06 DIAGNOSIS — E559 Vitamin D deficiency, unspecified: Secondary | ICD-10-CM

## 2024-07-06 DIAGNOSIS — E1159 Type 2 diabetes mellitus with other circulatory complications: Secondary | ICD-10-CM

## 2024-07-06 DIAGNOSIS — E1169 Type 2 diabetes mellitus with other specified complication: Secondary | ICD-10-CM

## 2024-07-06 DIAGNOSIS — I152 Hypertension secondary to endocrine disorders: Secondary | ICD-10-CM | POA: Diagnosis not present

## 2024-07-06 DIAGNOSIS — Z6833 Body mass index (BMI) 33.0-33.9, adult: Secondary | ICD-10-CM

## 2024-07-06 DIAGNOSIS — Z7985 Long-term (current) use of injectable non-insulin antidiabetic drugs: Secondary | ICD-10-CM

## 2024-07-06 NOTE — Progress Notes (Signed)
 WEIGHT SUMMARY AND BIOMETRICS  Vitals Temp: 98.7 F (37.1 C) BP: 136/84 Pulse Rate: (!) 59 SpO2: 99 %   Anthropometric Measurements Height: 5' 6 (1.676 m) Weight: 168 lb (76.2 kg) BMI (Calculated): 27.13 Weight at Last Visit: 166 lb Weight Lost Since Last Visit: 0 lb Weight Gained Since Last Visit: 2 lb Starting Weight: 206 lb Total Weight Loss (lbs): 38 lb (17.2 kg) Peak Weight: 215 lb   Body Composition  Body Fat %: 29.4 % Fat Mass (lbs): 49.4 lbs Muscle Mass (lbs): 112.8 lbs Total Body Water  (lbs): 81.2 lbs Visceral Fat Rating : 16   Other Clinical Data RMR: [id number] Labs: no Today's Visit #: 42    Chief Complaint:   OBESITY Bobby Munoz is here to discuss his progress with his obesity treatment plan.  He is on the the Category 3 Plan and states he is following his eating plan approximately 70 % of the time.  He states he is exercising Walking 60 minutes 7 times per week.  Interim History:  He has been walking with weighted vest and hand held weights.  He continues to walk at least 60 mins everyday.  He is really enjoying his retirement and feels that his overall health and well-being is optimal.  Current weight 168 lbs with corresponding BMI 27.1  Subjective:   1. Vitamin D  deficiency  Latest Reference Range & Units 06/23/23 09:59 10/22/23 09:16 02/04/24 08:41  Vitamin D , 25-Hydroxy 30.0 - 100.0 ng/mL 43.8 55.4 50.6   He is on weekly Ergocalciferol - denies N/V/Muscle Weakness He is really enjoying his retirement and feels that his overall health and well-being is optimal.  2. Type 2 diabetes mellitus with other specified complication, without long-term current use of insulin  (HCC) He is not checking home CBG, denise sx's of hypoglycemia Denies mass in neck, dysphagia, dyspepsia, persistent hoarseness, abdominal pain, or N/V/C  He is on  Blood Glucose Monitoring Suppl DEVI  atorvastatin  (LIPITOR ) 80 MG tablet  losartan  (COZAAR ) 25 MG tablet   Semaglutide , 1 MG/DOSE, 4 MG/3ML SOPN   3. Hypertension associated with diabetes (HCC) BP stable at OV He denies CP with exertion He is on nitroGLYCERIN  (NITROSTAT ) 0.4 MG SL tablet  atorvastatin  (LIPITOR ) 80 MG tablet  ezetimibe  (ZETIA ) 10 MG tablet  losartan  (COZAAR ) 25 MG tablet  Semaglutide , 1 MG/DOSE, 4 MG/3ML SOPN  amLODipine  (NORVASC ) 2.5 MG tablet   Assessment/Plan:   1. Vitamin D  deficiency (Primary) Continue weekly Ergocalciferol - denies N/V/Muscle Weakness  2. Type 2 diabetes mellitus with other specified complication, without long-term current use of insulin  (HCC) Monitor for sx's of hypoglycemia Recommend checking fasting CBG 1-2 times weekly of if any acute hypoer/hypoglycemia develop He denies need for refill of testing supplies  3. Hypertension associated with diabetes (HCC) Continue healthy eating and regular exercise  4. Obesity, current BMI 27.1  Buryl is currently in the action stage of change. As such, his goal is to maintain weight for now. He has agreed to the Category 3 Plan.   Exercise goals: Older adults should follow the adult guidelines. When older adults cannot meet the adult guidelines, they should be as physically active as their abilities and conditions will allow.  Older adults should do exercises that maintain or improve balance if they are at risk of falling.  Older adults should determine their level of effort for physical activity relative to their level of fitness.  Older adults with chronic conditions should understand whether and how their conditions affect their ability  to do regular physical activity safely. Add in Strength Training at least 1-2 times weekly.  Behavioral modification strategies: increasing lean protein intake, decreasing simple carbohydrates, increasing vegetables, increasing water  intake, no skipping meals, meal planning and cooking strategies, keeping healthy foods in the home, ways to avoid boredom eating, and planning  for success.  Oddie has agreed to follow-up with our clinic in 4 weeks. He was informed of the importance of frequent follow-up visits to maximize his success with intensive lifestyle modifications for his multiple health conditions.   Objective:   Blood pressure 136/84, pulse (!) 59, temperature 98.7 F (37.1 C), height 5' 6 (1.676 m), weight 168 lb (76.2 kg), SpO2 99%. Body mass index is 27.12 kg/m.  General: Cooperative, alert, well developed, in no acute distress. HEENT: Conjunctivae and lids unremarkable. Cardiovascular: Regular rhythm.  Lungs: Normal work of breathing. Neurologic: No focal deficits.   Lab Results  Component Value Date   CREATININE 0.67 (L) 02/04/2024   BUN 19 02/04/2024   NA 137 02/04/2024   K 4.3 02/04/2024   CL 98 02/04/2024   CO2 22 02/04/2024   Lab Results  Component Value Date   ALT 35 02/04/2024   AST 20 02/04/2024   ALKPHOS 56 02/04/2024   BILITOT 0.7 02/04/2024   Lab Results  Component Value Date   HGBA1C 5.9 (H) 02/04/2024   HGBA1C 5.7 (H) 10/22/2023   HGBA1C 5.3 04/14/2023   HGBA1C 5.7 (H) 10/02/2022   HGBA1C 5.8 (H) 06/04/2022   Lab Results  Component Value Date   INSULIN  14.1 02/04/2024   INSULIN  48.6 (H) 04/14/2023   INSULIN  14.4 10/02/2022   INSULIN  14.5 06/04/2022   INSULIN  13.8 01/31/2022   Lab Results  Component Value Date   TSH 2.370 11/22/2020   Lab Results  Component Value Date   CHOL 138 02/04/2024   HDL 61 02/04/2024   LDLCALC 55 02/04/2024   TRIG 129 02/04/2024   CHOLHDL 2.3 02/04/2024   Lab Results  Component Value Date   VD25OH 50.6 02/04/2024   VD25OH 55.4 10/22/2023   VD25OH 43.8 06/23/2023   Lab Results  Component Value Date   WBC 9.8 11/22/2020   HGB 14.6 08/19/2023   HCT 43.0 08/19/2023   MCV 97 11/22/2020   PLT 282 11/22/2020   No results found for: IRON, TIBC, FERRITIN  Attestation Statements:   Reviewed by clinician on day of visit: allergies, medications, problem list,  medical history, surgical history, family history, social history, and previous encounter notes.  Time spent on visit including pre-visit chart review and post-visit care and charting was 25 minutes.   I have reviewed the above documentation for accuracy and completeness, and I agree with the above. -  Kalvin Buss d. Corben Auzenne, NP-C

## 2024-07-08 DIAGNOSIS — N390 Urinary tract infection, site not specified: Secondary | ICD-10-CM | POA: Diagnosis not present

## 2024-07-08 DIAGNOSIS — R350 Frequency of micturition: Secondary | ICD-10-CM | POA: Diagnosis not present

## 2024-07-08 DIAGNOSIS — C61 Malignant neoplasm of prostate: Secondary | ICD-10-CM | POA: Diagnosis not present

## 2024-08-03 ENCOUNTER — Encounter (INDEPENDENT_AMBULATORY_CARE_PROVIDER_SITE_OTHER): Payer: Self-pay | Admitting: Adult Health

## 2024-08-03 ENCOUNTER — Ambulatory Visit (INDEPENDENT_AMBULATORY_CARE_PROVIDER_SITE_OTHER): Payer: Self-pay | Admitting: Adult Health

## 2024-08-03 VITALS — BP 132/81 | HR 65 | Temp 98.7°F | Ht 66.0 in | Wt 166.0 lb

## 2024-08-03 DIAGNOSIS — Z6832 Body mass index (BMI) 32.0-32.9, adult: Secondary | ICD-10-CM

## 2024-08-03 DIAGNOSIS — Z6826 Body mass index (BMI) 26.0-26.9, adult: Secondary | ICD-10-CM

## 2024-08-03 DIAGNOSIS — E1169 Type 2 diabetes mellitus with other specified complication: Secondary | ICD-10-CM

## 2024-08-03 DIAGNOSIS — E559 Vitamin D deficiency, unspecified: Secondary | ICD-10-CM

## 2024-08-03 DIAGNOSIS — I152 Hypertension secondary to endocrine disorders: Secondary | ICD-10-CM

## 2024-08-03 DIAGNOSIS — E1159 Type 2 diabetes mellitus with other circulatory complications: Secondary | ICD-10-CM | POA: Diagnosis not present

## 2024-08-03 DIAGNOSIS — Z7985 Long-term (current) use of injectable non-insulin antidiabetic drugs: Secondary | ICD-10-CM

## 2024-08-03 DIAGNOSIS — E669 Obesity, unspecified: Secondary | ICD-10-CM

## 2024-08-03 MED ORDER — VITAMIN D (ERGOCALCIFEROL) 1.25 MG (50000 UNIT) PO CAPS: 50000.0000 [IU] | ORAL_CAPSULE | ORAL | 0 refills | Status: DC

## 2024-08-03 NOTE — Progress Notes (Signed)
 WEIGHT SUMMARY AND BIOMETRICS  Vitals Temp: 98.7 F (37.1 C) BP: 132/81 Pulse Rate: 65 SpO2: 99 %   Anthropometric Measurements Height: 5' 6 (1.676 m) Weight: 166 lb (75.3 kg) BMI (Calculated): 26.81 Weight at Last Visit: 168lb Weight Lost Since Last Visit: 2lb Weight Gained Since Last Visit: 0lb Starting Weight: 206lb Total Weight Loss (lbs): 40 lb (18.1 kg) Peak Weight: 215lb   Body Composition  Body Fat %: 28.5 % Fat Mass (lbs): 47.4 lbs Muscle Mass (lbs): 113 lbs Total Body Water  (lbs): 79.2 lbs Visceral Fat Rating : 15   Other Clinical Data RMR: [id number] Fasting: No Labs: No Today's Visit #: 3 Starting Date: 11/19/20    Chief Complaint:   OBESITY Bobby Munoz is here to discuss his progress with his obesity treatment plan.  He is on the the Category 3 Plan and states he is following his eating plan approximately 70 % of the time.  He states he is exercising Brisk Walking 60 minutes 7 times per week.  Interim History:  Reviewed Bioimpedance Results with pt: Muscle Mass: +0.2 lb Adipose Mass: - 2 lbs  He is followed by Urology Q6M- s/p remission for Prostate Ca  He will walk at least 4.5 miles per day with his beloved dog. His dog is not feeling well today, dry heaving and low energy.  Subjective:   1. Type 2 diabetes mellitus with other specified complication, without long-term current use of insulin  (HCC) Lab Results  Component Value Date   HGBA1C 5.9 (H) 02/04/2024   HGBA1C 5.7 (H) 10/22/2023   HGBA1C 5.3 04/14/2023    He has not been checking home CBG He denies sx's of hypoglycemia She is on weekly Ozmepic 1mg  Denies mass in neck, dysphagia, dyspepsia, persistent hoarseness, abdominal pain, or N/V/C   2. Vitamin D  deficiency He endorses stable energy levels He is  on weekly Ergocalciferol   Latest Reference Range & Units 06/23/23 09:59 10/22/23 09:16 02/04/24 08:41  Vitamin D , 25-Hydroxy 30.0 - 100.0 ng/mL 43.8 55.4 50.6   3.  Hypertension associated with diabetes (HCC) BP stable at OV EPIC Review- his BP has been stable for > 12 months He is on  nitroGLYCERIN  (NITROSTAT ) 0.4 MG SL tablet  atorvastatin  (LIPITOR ) 80 MG tablet  ezetimibe  (ZETIA ) 10 MG tablet  losartan  (COZAAR ) 25 MG tablet  Semaglutide , 1 MG/DOSE, 4 MG/3ML SOPN  amLODipine  (NORVASC ) 2.5 MG tablet    Assessment/Plan:   1. Type 2 diabetes mellitus with other specified complication, without long-term current use of insulin  (HCC) (Primary) Continue healthy eating, regular walking, and weekly GLP-1 therapy Monitor for sx's of hypoglycemia Check Labs at next OV  2. Vitamin D  deficiency Check Labs at next OV Refill - Vitamin D , Ergocalciferol , (DRISDOL ) 1.25 MG (50000 UNIT) CAPS capsule; Take 1 capsule (50,000 Units total) by mouth every 7 (seven) days.  Dispense: 8 capsule; Refill: 0  3. Hypertension associated with diabetes (HCC) Check Labs at next OV Continue nitroGLYCERIN  (NITROSTAT ) 0.4 MG SL tablet  atorvastatin  (LIPITOR ) 80 MG tablet  ezetimibe  (ZETIA ) 10 MG tablet  losartan  (COZAAR ) 25 MG tablet  Semaglutide , 1 MG/DOSE, 4 MG/3ML SOPN  amLODipine  (NORVASC ) 2.5 MG tablet   4. Obesity, Current BMI 26.9  Odes is currently in the action stage of change. As such, his goal is to maintain weight for now. He has agreed to the Category 3 Plan.   Exercise goals: Older adults should follow the adult guidelines. When older adults cannot meet the adult guidelines, they  should be as physically active as their abilities and conditions will allow.  Older adults should do exercises that maintain or improve balance if they are at risk of falling.  Older adults should determine their level of effort for physical activity relative to their level of fitness.  Older adults with chronic conditions should understand whether and how their conditions affect their ability to do regular physical activity safely.  Behavioral modification strategies: increasing  lean protein intake, decreasing simple carbohydrates, increasing vegetables, increasing water  intake, no skipping meals, meal planning and cooking strategies, and keeping healthy foods in the home.  Bobby Munoz has agreed to follow-up with our clinic in 4 weeks. He was informed of the importance of frequent follow-up visits to maximize his success with intensive lifestyle modifications for his multiple health conditions.   Check Fasting Labs at next OV Consider Mx Phase Winter 2025  Objective:   Blood pressure 132/81, pulse 65, temperature 98.7 F (37.1 C), height 5' 6 (1.676 m), weight 166 lb (75.3 kg), SpO2 99%. Body mass index is 26.79 kg/m.  General: Cooperative, alert, well developed, in no acute distress. HEENT: Conjunctivae and lids unremarkable. Cardiovascular: Regular rhythm.  Lungs: Normal work of breathing. Neurologic: No focal deficits.   Lab Results  Component Value Date   CREATININE 0.67 (L) 02/04/2024   BUN 19 02/04/2024   NA 137 02/04/2024   K 4.3 02/04/2024   CL 98 02/04/2024   CO2 22 02/04/2024   Lab Results  Component Value Date   ALT 35 02/04/2024   AST 20 02/04/2024   ALKPHOS 56 02/04/2024   BILITOT 0.7 02/04/2024   Lab Results  Component Value Date   HGBA1C 5.9 (H) 02/04/2024   HGBA1C 5.7 (H) 10/22/2023   HGBA1C 5.3 04/14/2023   HGBA1C 5.7 (H) 10/02/2022   HGBA1C 5.8 (H) 06/04/2022   Lab Results  Component Value Date   INSULIN  14.1 02/04/2024   INSULIN  48.6 (H) 04/14/2023   INSULIN  14.4 10/02/2022   INSULIN  14.5 06/04/2022   INSULIN  13.8 01/31/2022   Lab Results  Component Value Date   TSH 2.370 11/22/2020   Lab Results  Component Value Date   CHOL 138 02/04/2024   HDL 61 02/04/2024   LDLCALC 55 02/04/2024   TRIG 129 02/04/2024   CHOLHDL 2.3 02/04/2024   Lab Results  Component Value Date   VD25OH 50.6 02/04/2024   VD25OH 55.4 10/22/2023   VD25OH 43.8 06/23/2023   Lab Results  Component Value Date   WBC 9.8 11/22/2020   HGB 14.6  08/19/2023   HCT 43.0 08/19/2023   MCV 97 11/22/2020   PLT 282 11/22/2020   No results found for: IRON, TIBC, FERRITIN  Attestation Statements:   Reviewed by clinician on day of visit: allergies, medications, problem list, medical history, surgical history, family history, social history, and previous encounter notes.  I have reviewed the above documentation for accuracy and completeness, and I agree with the above. -  Gerry Blanchfield d. Austen Wygant, NP-C

## 2024-08-04 ENCOUNTER — Other Ambulatory Visit (INDEPENDENT_AMBULATORY_CARE_PROVIDER_SITE_OTHER): Payer: Self-pay | Admitting: Adult Health

## 2024-08-04 ENCOUNTER — Telehealth: Payer: Self-pay | Admitting: Pharmacist

## 2024-08-04 DIAGNOSIS — E1169 Type 2 diabetes mellitus with other specified complication: Secondary | ICD-10-CM

## 2024-08-04 MED ORDER — SEMAGLUTIDE (1 MG/DOSE) 4 MG/3ML ~~LOC~~ SOPN: 1.0000 mg | PEN_INJECTOR | SUBCUTANEOUS | 0 refills | Status: AC

## 2024-08-04 NOTE — Progress Notes (Signed)
   08/04/2024  Patient ID: Caison P Staat, male   DOB: 09/16/1956, 68 y.o.   MRN: [id number]  Patient appeared on med adherence list for 2025:  Ozempic  1mg - need 291 covered/ 252 currently- due 11/14; needs refills  Atorvastatin - need 278 covered/ 300 currently= pass  Can't miss more than 4 days to pass   Patient had weight loss clinic yesterday. However no refills were sent to the pharmacy. Will send message to provider.  Aloysius Lewis, PharmD, [facility] [place] [facility] Phone Number: [phone]

## 2024-09-03 ENCOUNTER — Other Ambulatory Visit: Payer: Self-pay | Admitting: Interventional Cardiology

## 2024-09-06 ENCOUNTER — Encounter: Payer: Self-pay | Admitting: Cardiology

## 2024-09-06 ENCOUNTER — Ambulatory Visit: Attending: Cardiology | Admitting: Cardiology

## 2024-09-06 VITALS — BP 120/82 | HR 73 | Ht 66.0 in | Wt 175.3 lb

## 2024-09-06 DIAGNOSIS — I10 Essential (primary) hypertension: Secondary | ICD-10-CM

## 2024-09-06 DIAGNOSIS — I2510 Atherosclerotic heart disease of native coronary artery without angina pectoris: Secondary | ICD-10-CM

## 2024-09-06 DIAGNOSIS — E782 Mixed hyperlipidemia: Secondary | ICD-10-CM

## 2024-09-06 NOTE — Progress Notes (Signed)
 " Cardiology Office Note:  .   Date:  09/06/2024  ID:  Bobby Munoz, DOB 12/26/1955, MRN [id number] PCP: Cleotilde Planas, MD  [facility] Providers Cardiologist:  Newman Lawrence, MD PCP: Cleotilde Planas, MD  Chief Complaint  Patient presents with   Coronary Artery Disease     Bobby Munoz is a 68 y.o. male with hypertension, hyperlipidemia, CAD  Discussed the use of AI scribe software for clinical note transcription with the patient, who gave verbal consent to proceed.  History of Present Illness Bobby Munoz is a 68 year old with coronary artery disease and hypertension who presents for a follow-up visit.  He has coronary artery disease with multiple prior stents. He walks about five miles daily without chest pain or shortness of breath. He asks if stents have a lifespan. His current medications include amlodipine  2.5 mg, atorvastatin  80 mg, clopidogrel  75 mg, ezetimibe  10 mg, and semaglutide  injections.  He has hypertension treated with amlodipine  2.5 mg. Losartan  was stopped over a year ago after an episode of low blood pressure. Office blood pressures in October and November were about 130/80 mmHg. He does not check blood pressure at home. He has losartan  available but is not taking it.  His diabetes is controlled with semaglutide  injections.  He has treated prostate cancer.  He has chronic back pain with prior procedures and injections. He denies chest pain or shortness of breath.  He has never smoked. He lives in [place] and stays active with daily walks with his dog.      Vitals:   09/06/24 1536  BP: (!) 149/90  Pulse: 73  SpO2: 98%      Review of Systems  Cardiovascular:  Negative for chest pain, dyspnea on exertion, leg swelling, palpitations and syncope.        Studies Reviewed: SABRA        EKG 09/06/2024: Normal sinus rhythm Left axis deviation Left bundle branch block When compared with ECG of 15-Jul-2023 11:38, No significant change was  found  Labs 01/2024: Chol 138, TG 129, HDL 61, LDL 55 HbA1C 5.9% Cr 0.67     Physical Exam Vitals and nursing note reviewed.  Constitutional:      General: He is not in acute distress. Neck:     Vascular: No JVD.  Cardiovascular:     Rate and Rhythm: Normal rate and regular rhythm.     Heart sounds: Murmur heard.     High-pitched blowing holosystolic murmur is present with a grade of 2/6 at the apex.  Pulmonary:     Effort: Pulmonary effort is normal.     Breath sounds: Normal breath sounds. No wheezing or rales.  Musculoskeletal:     Right lower leg: No edema.     Left lower leg: No edema.      VISIT DIAGNOSES:   ICD-10-CM   1. Primary hypertension  I10 EKG 12-Lead    2. Coronary artery disease involving native coronary artery of native heart without angina pectoris  I25.10 EKG 12-Lead    ECHOCARDIOGRAM COMPLETE    3. Mixed hyperlipidemia  E78.2 EKG 12-Lead       Bobby Munoz is a 68 y.o. male with hypertension, hyperlipidemia, CAD  Assessment & Plan Coronary artery disease: 2 stents to LAD in 02/2014, and 4 overlapping stents for CTO RCA in 04/2014 No anginal symptoms at this time. Continue monotherapy with Plavix  75 mg daily. Will obtain echocardiogram, last in 2019, mild MR murmur noted again  today. See below regarding hypertension, diabetes management.  Primary hypertension Blood pressure elevated at 149/90 mmHg. Losartan  discontinued due to hypotension in the past. Current management with amlodipine  2.5 mg. Discussed potential to restart losartan  for renal protection in diabetes. - Monitor blood pressure at home, report if consistently elevated. - Consider restarting losartan  if blood pressure remains elevated, check renal function one week after resumption. - Discuss with primary care physician regarding blood pressure management.  Mitral regurgitation: Mild mitral regurgitation, consistent with previous findings, no new symptoms. - No immediate  intervention unless new symptoms develop.  Type 2 diabetes mellitus: Diabetes well-controlled with semaglutide . - Continue current diabetes management with semaglutide .  Mixed hyperlipidemia: Managed with atorvastatin  80 mg and ezetimibe  10 mg. - Continue current lipid-lowering therapy.     F/u in 1 year  Signed, Newman JINNY Lawrence, MD  "

## 2024-09-06 NOTE — Patient Instructions (Signed)
  Testing/Procedures: Echocardiogram  Your physician has requested that you have an echocardiogram. Echocardiography is a painless test that uses sound waves to create images of your heart. It provides your doctor with information about the size and shape of your heart and how well your heart's chambers and valves are working. This procedure takes approximately one hour. There are no restrictions for this procedure. Please do NOT wear cologne, perfume, aftershave, or lotions (deodorant is allowed). Please arrive 15 minutes prior to your appointment time.  Please note: We ask at that you not bring children with you during ultrasound (echo/ vascular) testing. Due to room size and safety concerns, children are not allowed in the ultrasound rooms during exams. Our front office staff cannot provide observation of children in our lobby area while testing is being conducted. An adult accompanying a patient to their appointment will only be allowed in the ultrasound room at the discretion of the ultrasound technician under special circumstances. We apologize for any inconvenience.   Follow-Up: At [facility], you and your health needs are our priority.  As part of our continuing mission to provide you with exceptional heart care, our providers are all part of one team.  This team includes your primary Cardiologist (physician) and Advanced Practice Providers or APPs (Physician Assistants and Nurse Practitioners) who all work together to provide you with the care you need, when you need it.  Your next appointment:   1 year(s)  Provider:   Newman JINNY Lawrence, MD

## 2024-09-12 ENCOUNTER — Other Ambulatory Visit: Payer: Self-pay | Admitting: Interventional Cardiology

## 2024-09-13 ENCOUNTER — Ambulatory Visit (INDEPENDENT_AMBULATORY_CARE_PROVIDER_SITE_OTHER): Admitting: Adult Health

## 2024-09-13 VITALS — BP 144/78 | HR 73 | Temp 98.0°F | Ht 66.0 in | Wt 166.0 lb

## 2024-09-13 DIAGNOSIS — Z6826 Body mass index (BMI) 26.0-26.9, adult: Secondary | ICD-10-CM | POA: Diagnosis not present

## 2024-09-13 DIAGNOSIS — E1159 Type 2 diabetes mellitus with other circulatory complications: Secondary | ICD-10-CM

## 2024-09-13 DIAGNOSIS — I152 Hypertension secondary to endocrine disorders: Secondary | ICD-10-CM

## 2024-09-13 DIAGNOSIS — Z7985 Long-term (current) use of injectable non-insulin antidiabetic drugs: Secondary | ICD-10-CM

## 2024-09-13 DIAGNOSIS — E669 Obesity, unspecified: Secondary | ICD-10-CM

## 2024-09-13 DIAGNOSIS — E559 Vitamin D deficiency, unspecified: Secondary | ICD-10-CM

## 2024-09-13 DIAGNOSIS — Z6832 Body mass index (BMI) 32.0-32.9, adult: Secondary | ICD-10-CM

## 2024-09-13 DIAGNOSIS — I10 Essential (primary) hypertension: Secondary | ICD-10-CM

## 2024-09-13 DIAGNOSIS — E1169 Type 2 diabetes mellitus with other specified complication: Secondary | ICD-10-CM | POA: Diagnosis not present

## 2024-09-13 DIAGNOSIS — Z0000 Encounter for general adult medical examination without abnormal findings: Secondary | ICD-10-CM

## 2024-09-13 NOTE — Progress Notes (Signed)
 "    WEIGHT SUMMARY AND BIOMETRICS  Vitals Temp: 98 F (36.7 C) BP: (!) 144/78 Pulse Rate: 73 SpO2: 97 %   Anthropometric Measurements Height: 5' 6 (1.676 m) Weight: 166 lb (75.3 kg) BMI (Calculated): 26.81 Weight at Last Visit: 166 lb Weight Lost Since Last Visit: 0 Weight Gained Since Last Visit: 0 Starting Weight: 206 lb Total Weight Loss (lbs): 40 lb (18.1 kg) Peak Weight: 215 lb   Body Composition  Body Fat %: 27.6 % Fat Mass (lbs): 46 lbs Muscle Mass (lbs): 114.4 lbs Total Body Water  (lbs): 78.6 lbs Visceral Fat Rating : 5   Other Clinical Data Fasting: yes Labs: yes Today's Visit #: 55 Starting Date: 11/19/20    Chief Complaint:   OBESITY Bobby Munoz is here to discuss his progress with his obesity treatment plan.  He is on the the Category 3 Plan and states he is following his eating plan approximately 70 % of the time.  He states he is exercising Walking 60+ minutes 7 times per week.  Interim History:  Reviewed Bioimpedance Results with pt: Muscle Mass: +1.4 lbs Adipose Mass: -1.4 lbs  Chronic f/u with his established Urologist Dec 2025- no changes to current tx plan  09/06/2024: Chronic f/u with established Cards- BP elevated No change to antihypertensives and ECHO ordered for end Jan 2026 Home readings: SBP: 130s DBP: 70-80  Initial and recheck BPs at [facility] today, both >130/80 He denies acute cardiac sx's at present  Subjective:   1. Health care maintenance  09/06/2024: Chronic f/u with established Cards- BP elevated No change to antihypertnesives and ECHO ordered for end Jan 2026 Home readings: SBP: 130s DBP: 70-80  Initial and recheck BPs at [facility] today, both >130/80 He denies acute cardiac sx's at present  2. Vitamin D  deficiency He endorses stable energy levels  3. Type 2 diabetes mellitus with other specified complication, without long-term current use of insulin  (HCC) He administers Ozempic  1mg  on Sunday Denies mass in neck,  dysphagia, dyspepsia, persistent hoarseness, abdominal pain, or N/V/C  He has not been checking home CBG He denies sx's of hypoglycemia- handout provided  4. Hypertension associated with diabetes (HCC)  09/06/2024: Chronic f/u with established Cards- BP elevated No change to antihypertnesives and ECHO ordered for end Jan 2026 Home readings: SBP: 130s DBP: 70-80  Initial and recheck BPs at [facility] today, both >130/80 He denies acute cardiac sx's at present  Assessment/Plan:   1. Health care maintenance Check Labs - TSH + free T4  2. Vitamin D  deficiency (Primary) Check Labs - VITAMIN D  25 Hydroxy (Vit-D Deficiency, Fractures)  3. Type 2 diabetes mellitus with other specified complication, without long-term current use of insulin  (HCC) Check Labs - Hemoglobin A1c - Insulin , random  4. Hypertension associated with diabetes (HCC) Check Labs - Comprehensive metabolic panel with GFR  5. Obesity, Current BMI 26.9  Bobby Munoz is currently in the action stage of change. As such, his goal is to continue with weight loss efforts. He has agreed to the Category 3 Plan.   Exercise goals: Older adults should follow the adult guidelines. When older adults cannot meet the adult guidelines, they should be as physically active as their abilities and conditions will allow.  Older adults should do exercises that maintain or improve balance if they are at risk of falling.  Older adults should determine their level of effort for physical activity relative to their level of fitness.  Older adults with chronic conditions should understand whether and how  their conditions affect their ability to do regular physical activity safely.  Behavioral modification strategies: increasing lean protein intake, decreasing simple carbohydrates, increasing vegetables, increasing water  intake, no skipping meals, meal planning and cooking strategies, keeping healthy foods in the home, ways to avoid boredom eating, holiday  eating strategies , celebration eating strategies, and planning for success.  Bobby Munoz has agreed to follow-up with our clinic in 4 weeks. He was informed of the importance of frequent follow-up visits to maximize his success with intensive lifestyle modifications for his multiple health conditions.   Bobby Munoz was informed we would discuss his lab results at his next visit unless there is a critical issue that needs to be addressed sooner. Bobby Munoz agreed to keep his next visit at the agreed upon time to discuss these results.  Objective:   Blood pressure (!) 144/78, pulse 73, temperature 98 F (36.7 C), height 5' 6 (1.676 m), weight 166 lb (75.3 kg), SpO2 97%. Body mass index is 26.79 kg/m.  General: Cooperative, alert, well developed, in no acute distress. HEENT: Conjunctivae and lids unremarkable. Cardiovascular: Regular rhythm.  Lungs: Normal work of breathing. Neurologic: No focal deficits.   Lab Results  Component Value Date   CREATININE 0.67 (L) 02/04/2024   BUN 19 02/04/2024   NA 137 02/04/2024   K 4.3 02/04/2024   CL 98 02/04/2024   CO2 22 02/04/2024   Lab Results  Component Value Date   ALT 35 02/04/2024   AST 20 02/04/2024   ALKPHOS 56 02/04/2024   BILITOT 0.7 02/04/2024   Lab Results  Component Value Date   HGBA1C 5.9 (H) 02/04/2024   HGBA1C 5.7 (H) 10/22/2023   HGBA1C 5.3 04/14/2023   HGBA1C 5.7 (H) 10/02/2022   HGBA1C 5.8 (H) 06/04/2022   Lab Results  Component Value Date   INSULIN  14.1 02/04/2024   INSULIN  48.6 (H) 04/14/2023   INSULIN  14.4 10/02/2022   INSULIN  14.5 06/04/2022   INSULIN  13.8 01/31/2022   Lab Results  Component Value Date   TSH 2.370 11/22/2020   Lab Results  Component Value Date   CHOL 138 02/04/2024   HDL 61 02/04/2024   LDLCALC 55 02/04/2024   TRIG 129 02/04/2024   CHOLHDL 2.3 02/04/2024   Lab Results  Component Value Date   VD25OH 50.6 02/04/2024   VD25OH 55.4 10/22/2023   VD25OH 43.8 06/23/2023   Lab Results  Component  Value Date   WBC 9.8 11/22/2020   HGB 14.6 08/19/2023   HCT 43.0 08/19/2023   MCV 97 11/22/2020   PLT 282 11/22/2020   No results found for: IRON, TIBC, FERRITIN  Attestation Statements:   Reviewed by clinician on day of visit: allergies, medications, problem list, medical history, surgical history, family history, social history, and previous encounter notes.  I have reviewed the above documentation for accuracy and completeness, and I agree with the above. -  Bobby Munoz d. Iliyah Bui, NP-C "

## 2024-09-14 LAB — COMPREHENSIVE METABOLIC PANEL WITH GFR
ALT: 23 IU/L (ref 0–44)
AST: 23 IU/L (ref 0–40)
Albumin: 4.5 g/dL (ref 3.9–4.9)
Alkaline Phosphatase: 54 IU/L (ref 47–123)
BUN/Creatinine Ratio: 22 (ref 10–24)
BUN: 17 mg/dL (ref 8–27)
Bilirubin Total: 1.1 mg/dL (ref 0.0–1.2)
CO2: 21 mmol/L (ref 20–29)
Calcium: 9.7 mg/dL (ref 8.6–10.2)
Chloride: 103 mmol/L (ref 96–106)
Creatinine, Ser: 0.76 mg/dL (ref 0.76–1.27)
Globulin, Total: 2.6 g/dL (ref 1.5–4.5)
Glucose: 91 mg/dL (ref 70–99)
Potassium: 4.3 mmol/L (ref 3.5–5.2)
Sodium: 139 mmol/L (ref 134–144)
Total Protein: 7.1 g/dL (ref 6.0–8.5)
eGFR: 97 mL/min/1.73

## 2024-09-14 LAB — HEMOGLOBIN A1C
Est. average glucose Bld gHb Est-mCnc: 120 mg/dL
Hgb A1c MFr Bld: 5.8 % — ABNORMAL HIGH (ref 4.8–5.6)

## 2024-09-14 LAB — INSULIN, RANDOM: INSULIN: 12.4 u[IU]/mL (ref 2.6–24.9)

## 2024-09-14 LAB — VITAMIN D 25 HYDROXY (VIT D DEFICIENCY, FRACTURES): Vit D, 25-Hydroxy: 58 ng/mL (ref 30.0–100.0)

## 2024-09-14 LAB — TSH+FREE T4
Free T4: 1.12 ng/dL (ref 0.82–1.77)
TSH: 1.39 u[IU]/mL (ref 0.450–4.500)

## 2024-10-11 ENCOUNTER — Ambulatory Visit (INDEPENDENT_AMBULATORY_CARE_PROVIDER_SITE_OTHER): Admitting: Adult Health

## 2024-10-12 ENCOUNTER — Telehealth (INDEPENDENT_AMBULATORY_CARE_PROVIDER_SITE_OTHER): Payer: Self-pay

## 2024-10-12 NOTE — Telephone Encounter (Signed)
 I called pt. No answer, left a message asking pt to call me back. To r/s visit.

## 2024-10-13 ENCOUNTER — Ambulatory Visit (HOSPITAL_COMMUNITY)
Admission: RE | Admit: 2024-10-13 | Discharge: 2024-10-13 | Disposition: A | Discharge status: Home / Self Care | Source: Ambulatory Visit | Attending: Cardiovascular Disease | Admitting: Cardiovascular Disease

## 2024-10-13 ENCOUNTER — Telehealth (INDEPENDENT_AMBULATORY_CARE_PROVIDER_SITE_OTHER): Payer: Self-pay | Admitting: Adult Health

## 2024-10-13 DIAGNOSIS — I2510 Atherosclerotic heart disease of native coronary artery without angina pectoris: Secondary | ICD-10-CM | POA: Diagnosis present

## 2024-10-13 LAB — ECHOCARDIOGRAM COMPLETE: S' Lateral: 3.8 cm

## 2024-10-13 NOTE — Telephone Encounter (Signed)
 Lvm for pt to call to r/s appt that was canceled due to weather.

## 2024-10-14 ENCOUNTER — Ambulatory Visit: Payer: Self-pay | Admitting: Cardiology

## 2024-10-17 ENCOUNTER — Encounter (INDEPENDENT_AMBULATORY_CARE_PROVIDER_SITE_OTHER): Payer: Self-pay | Admitting: *Deleted

## 2024-10-18 ENCOUNTER — Encounter (INDEPENDENT_AMBULATORY_CARE_PROVIDER_SITE_OTHER): Payer: Self-pay

## 2024-10-18 ENCOUNTER — Ambulatory Visit (INDEPENDENT_AMBULATORY_CARE_PROVIDER_SITE_OTHER): Admitting: Nurse Practitioner

## 2024-10-19 ENCOUNTER — Ambulatory Visit (INDEPENDENT_AMBULATORY_CARE_PROVIDER_SITE_OTHER): Admitting: Nurse Practitioner

## 2024-10-19 ENCOUNTER — Encounter (INDEPENDENT_AMBULATORY_CARE_PROVIDER_SITE_OTHER): Payer: Self-pay | Admitting: Nurse Practitioner

## 2024-10-19 VITALS — BP 144/62 | HR 64 | Temp 98.0°F | Ht 66.0 in | Wt 167.0 lb

## 2024-10-19 DIAGNOSIS — Z6826 Body mass index (BMI) 26.0-26.9, adult: Secondary | ICD-10-CM

## 2024-10-19 DIAGNOSIS — E1159 Type 2 diabetes mellitus with other circulatory complications: Secondary | ICD-10-CM

## 2024-10-19 DIAGNOSIS — E663 Overweight: Secondary | ICD-10-CM | POA: Diagnosis not present

## 2024-10-19 DIAGNOSIS — I10 Essential (primary) hypertension: Secondary | ICD-10-CM | POA: Diagnosis not present

## 2024-10-19 DIAGNOSIS — Z7985 Long-term (current) use of injectable non-insulin antidiabetic drugs: Secondary | ICD-10-CM

## 2024-10-19 DIAGNOSIS — E559 Vitamin D deficiency, unspecified: Secondary | ICD-10-CM

## 2024-10-19 DIAGNOSIS — E1169 Type 2 diabetes mellitus with other specified complication: Secondary | ICD-10-CM

## 2024-10-19 DIAGNOSIS — Z8639 Personal history of other endocrine, nutritional and metabolic disease: Secondary | ICD-10-CM | POA: Diagnosis not present

## 2024-10-19 DIAGNOSIS — E785 Hyperlipidemia, unspecified: Secondary | ICD-10-CM

## 2024-10-19 MED ORDER — VITAMIN D (ERGOCALCIFEROL) 1.25 MG (50000 UNIT) PO CAPS: 50000.0000 [IU] | ORAL_CAPSULE | ORAL | 0 refills | Status: AC

## 2024-11-16 ENCOUNTER — Ambulatory Visit (INDEPENDENT_AMBULATORY_CARE_PROVIDER_SITE_OTHER): Admitting: Nurse Practitioner
